# Patient Record
Sex: Female | Born: 1985 | Race: Black or African American | Hispanic: No | Marital: Single | Smoking: Former smoker
Health system: Southern US, Community
[De-identification: ages and names within clinical notes are randomized; demographics above are authoritative.]

## PROBLEM LIST (undated history)

## (undated) ENCOUNTER — Inpatient Hospital Stay (HOSPITAL_COMMUNITY): Payer: Self-pay

## (undated) DIAGNOSIS — R42 Dizziness and giddiness: Secondary | ICD-10-CM

## (undated) DIAGNOSIS — O009 Unspecified ectopic pregnancy without intrauterine pregnancy: Secondary | ICD-10-CM

## (undated) DIAGNOSIS — A4902 Methicillin resistant Staphylococcus aureus infection, unspecified site: Secondary | ICD-10-CM

## (undated) DIAGNOSIS — C510 Malignant neoplasm of labium majus: Secondary | ICD-10-CM

## (undated) DIAGNOSIS — B999 Unspecified infectious disease: Secondary | ICD-10-CM

## (undated) DIAGNOSIS — R87629 Unspecified abnormal cytological findings in specimens from vagina: Secondary | ICD-10-CM

## (undated) DIAGNOSIS — R51 Headache: Secondary | ICD-10-CM

## (undated) DIAGNOSIS — C801 Malignant (primary) neoplasm, unspecified: Secondary | ICD-10-CM

## (undated) DIAGNOSIS — G479 Sleep disorder, unspecified: Secondary | ICD-10-CM

## (undated) HISTORY — PX: LAPAROTOMY: SHX154

## (undated) HISTORY — DX: Unspecified abnormal cytological findings in specimens from vagina: R87.629

## (undated) HISTORY — PX: CRYOTHERAPY: SHX1416

## (undated) HISTORY — PX: DILATION AND CURETTAGE OF UTERUS: SHX78

## (undated) HISTORY — DX: Sleep disorder, unspecified: G47.9

---

## 2013-02-19 ENCOUNTER — Encounter (HOSPITAL_COMMUNITY): Payer: Self-pay | Admitting: *Deleted

## 2013-02-19 ENCOUNTER — Emergency Department (HOSPITAL_COMMUNITY)
Admission: EM | Admit: 2013-02-19 | Discharge: 2013-02-19 | Disposition: A | Discharge status: Home / Self Care | Payer: Medicaid Other | Attending: Emergency Medicine | Admitting: Emergency Medicine

## 2013-02-19 DIAGNOSIS — R197 Diarrhea, unspecified: Secondary | ICD-10-CM | POA: Insufficient documentation

## 2013-02-19 DIAGNOSIS — R509 Fever, unspecified: Secondary | ICD-10-CM | POA: Insufficient documentation

## 2013-02-19 DIAGNOSIS — B9689 Other specified bacterial agents as the cause of diseases classified elsewhere: Secondary | ICD-10-CM

## 2013-02-19 DIAGNOSIS — R110 Nausea: Secondary | ICD-10-CM

## 2013-02-19 DIAGNOSIS — F17200 Nicotine dependence, unspecified, uncomplicated: Secondary | ICD-10-CM | POA: Insufficient documentation

## 2013-02-19 DIAGNOSIS — N760 Acute vaginitis: Secondary | ICD-10-CM | POA: Insufficient documentation

## 2013-02-19 DIAGNOSIS — R112 Nausea with vomiting, unspecified: Secondary | ICD-10-CM | POA: Insufficient documentation

## 2013-02-19 DIAGNOSIS — R109 Unspecified abdominal pain: Secondary | ICD-10-CM | POA: Insufficient documentation

## 2013-02-19 DIAGNOSIS — Z8541 Personal history of malignant neoplasm of cervix uteri: Secondary | ICD-10-CM | POA: Insufficient documentation

## 2013-02-19 DIAGNOSIS — Z331 Pregnant state, incidental: Secondary | ICD-10-CM | POA: Insufficient documentation

## 2013-02-19 DIAGNOSIS — Z3202 Encounter for pregnancy test, result negative: Secondary | ICD-10-CM | POA: Insufficient documentation

## 2013-02-19 HISTORY — DX: Unspecified ectopic pregnancy without intrauterine pregnancy: O00.90

## 2013-02-19 HISTORY — DX: Malignant (primary) neoplasm, unspecified: C80.1

## 2013-02-19 LAB — BASIC METABOLIC PANEL
BUN: 9 mg/dL (ref 6–23)
Calcium: 9.2 mg/dL (ref 8.4–10.5)
GFR calc Af Amer: >90 mL/min (ref >90)
GFR calc non Af Amer: >90 mL/min (ref >90)
Potassium: 3.8 mEq/L (ref 3.5–5.1)
Sodium: 137 mEq/L (ref 135–145)

## 2013-02-19 LAB — URINALYSIS, ROUTINE W REFLEX MICROSCOPIC
Ketones, ur: NEGATIVE mg/dL
Leukocytes, UA: NEGATIVE
Nitrite: NEGATIVE
Protein, ur: NEGATIVE mg/dL

## 2013-02-19 LAB — CBC WITH DIFFERENTIAL/PLATELET
Basophils Relative: 0 % (ref 0–1)
Eosinophils Absolute: 0 10*3/uL (ref 0.0–0.7)
Eosinophils Relative: 1 % (ref 0–5)
MCH: 30.3 pg (ref 26.0–34.0)
MCHC: 33.5 g/dL (ref 30.0–36.0)
Monocytes Relative: 8 % (ref 3–12)
Neutrophils Relative %: 48 % (ref 43–77)
Platelets: 288 10*3/uL (ref 150–400)

## 2013-02-19 LAB — WET PREP, GENITAL: Trich, Wet Prep: NONE SEEN

## 2013-02-19 MED ORDER — GI COCKTAIL ~~LOC~~
30.0000 mL | Freq: Once | ORAL | Status: AC
  Administered 2013-02-19: 30 mL via ORAL
  Filled 2013-02-19: qty 30

## 2013-02-19 MED ORDER — METRONIDAZOLE 500 MG PO TABS: 500.0000 mg | ORAL_TABLET | Freq: Two times a day (BID) | ORAL | Status: DC

## 2013-02-19 MED ORDER — ONDANSETRON 8 MG PO TBDP
8.0000 mg | ORAL_TABLET | Freq: Once | ORAL | Status: AC
  Administered 2013-02-19: 8 mg via ORAL
  Filled 2013-02-19: qty 1

## 2013-02-19 MED ORDER — ONDANSETRON HCL 4 MG PO TABS: 4.0000 mg | ORAL_TABLET | Freq: Four times a day (QID) | ORAL | Status: DC

## 2013-02-19 NOTE — Progress Notes (Signed)
[facility] has seen patient. Pt stated that she just came to [facility] from [place] three weeks ago. Provided her with a list of primary care resources.

## 2013-02-19 NOTE — ED Provider Notes (Signed)
History    CSN: [id number] Arrival date & time 02/19/13  1350  First MD Initiated Contact with Patient 02/19/13 1357     No chief complaint on file.  (Consider location/radiation/quality/duration/timing/severity/associated sxs/prior Treatment) HPI  Patient is a 27 year old G3 P2 female presenting to the emergency department with 3 weeks of nausea, bloody nonbilious vomiting, non-bloody diarrhea. Patient states she's associated cramping bilateral lower abdominal pain w/o radiation. Rates her pain 3-4/10. Denies any aggravating or alleviating factors. She states she was hospitalized in [place] in May for his symptoms such as told to follow up with the GI doctor which she hasn't. Patient states that she believes she might be pregnant with her LMP 01/17/2013. Denies vaginal bleeding vaginal pain, vaginal discharge, pelvic pain.  Past Medical History  Diagnosis Date  . Cancer     cervical  . Extrauterine pregnancy    Past Surgical History  Procedure Laterality Date  . Cesarean section     History reviewed. No pertinent family history. History  Substance Use Topics  . Smoking status: Current Some Day Smoker  . Smokeless tobacco: Not on file  . Alcohol Use: Yes     Comment: occasionally   OB History   Grav Para Term Preterm Abortions TAB SAB Ect Mult Living                 Review of Systems  Constitutional: Positive for fever and chills.  Respiratory: Negative for shortness of breath.   Cardiovascular: Negative for chest pain.  Gastrointestinal: Positive for nausea, vomiting and diarrhea. Negative for abdominal distention.  Genitourinary: Negative for dysuria, urgency, vaginal bleeding, vaginal discharge and vaginal pain.  All other systems reviewed and are negative.    Allergies  Bactrim and Oxycodone  Home Medications   Current Outpatient Rx  Name  Route  Sig  Dispense  Refill  . acetaminophen (TYLENOL) 500 MG tablet   Oral   Take 1,000 mg by mouth every 6 (six)  hours as needed for pain.         . metroNIDAZOLE (FLAGYL) 500 MG tablet   Oral   Take 1 tablet (500 mg total) by mouth 2 (two) times daily.   14 tablet   0   . ondansetron (ZOFRAN) 4 MG tablet   Oral   Take 1 tablet (4 mg total) by mouth every 6 (six) hours.   12 tablet   0    BP 110/58  Pulse 74  Temp(Src) 97.9 F (36.6 C) (Oral)  Resp 16  Ht 5\' 2"  (1.575 m)  Wt 115 lb (52.164 kg)  BMI 21.03 kg/m2  SpO2 98%  LMP 01/17/2013 Physical Exam  Constitutional: She is oriented to person, place, and time. She appears well-developed and well-nourished. No distress.  HENT:  Head: Normocephalic and atraumatic.  Mouth/Throat: Oropharynx is clear and moist.  Eyes: Conjunctivae are normal.  Neck: Neck supple.  Cardiovascular: Normal rate, regular rhythm and normal heart sounds.   Pulmonary/Chest: Effort normal and breath sounds normal.  Abdominal: Soft. Bowel sounds are normal. She exhibits no distension. There is tenderness in the right lower quadrant, suprapubic area and left lower quadrant. There is no rigidity, no rebound and no guarding.  Neurological: She is alert and oriented to person, place, and time.  Skin: Skin is warm and dry. She is not diaphoretic.   Exam performed by Francee Piccolo L,  exam chaperoned Date: 02/19/2013 Pelvic exam: normal external genitalia without evidence of trauma. VULVA: normal appearing vulva with no  masses, tenderness or lesion. VAGINA: normal appearing vagina with normal color and discharge, no lesions. CERVIX: normal appearing cervix without lesions, cervical motion tenderness absent, cervical os closed with out purulent discharge; vaginal discharge - white, Wet prep and DNA probe for chlamydia and GC obtained.   ADNEXA: normal adnexa in size, nontender and no masses UTERUS: uterus is normal size, shape, consistency and nontender.    ED Course  Procedures (including critical care time) Labs Reviewed  WET PREP, GENITAL - Abnormal;  Notable for the following:    Clue Cells Wet Prep HPF POC FEW (*)    WBC, Wet Prep HPF POC RARE (*)    All other components within normal limits  URINALYSIS, ROUTINE W REFLEX MICROSCOPIC - Abnormal; Notable for the following:    APPearance CLOUDY (*)    All other components within normal limits  CBC WITH DIFFERENTIAL - Abnormal; Notable for the following:    Hemoglobin 11.9 (*)    HCT 35.5 (*)    All other components within normal limits  HCG, QUANTITATIVE, PREGNANCY - Abnormal; Notable for the following:    hCG, Beta Chain, Quant, S 2196 (*)    All other components within normal limits  POCT PREGNANCY, URINE - Abnormal; Notable for the following:    Preg Test, Ur POSITIVE (*)    All other components within normal limits  GC/CHLAMYDIA PROBE AMP  BASIC METABOLIC PANEL   No results found. 1. Nausea   2. Pregnancy as incidental finding   3. Diarrhea   4. Bacterial vaginosis     MDM  Patient with nausea, vomiting, diarrhea for one month. Physical examination reveals soft mildly tender nondistended abdomen with normal bowel sounds. Pelvic exam reveals weight discharge but no bleeding, adnexal fullness, CMT. Labs reviewed. Patient was informed of her pregnancy status. No imaging indicated at this time. No concern for tubal pregnancy at this time. Patient was advised to followup with the GI doctor in the area as she had been 2 months ago with the onset of the symptoms. Patient also advised to followup with OB/GYN at [facility] to discuss her pregnancy. Patient's symptoms were managed in the ED. Patient will be discharged symptomatic care. Discussed case with Dr. Patria Mane, who agrees with my plan. Patient is agreeable to plan. Patient is stable at time of discharge    Jeannetta Ellis, PA-C 02/19/13 1928

## 2013-02-19 NOTE — ED Notes (Signed)
Pt presents to ed with c/o vomiting and diarrhea x1 month; pt sts was hospitalized for the same month ago in [place], diagnosed with inflammation. Pt was referred to GI doctor which she didn't follow up with. Pt aslo reports fever unsure of how high yesterday. Pt also sts she might be pregnant; LMP 01/17/2013. Pt reports lower abdominal pain with cramps.

## 2013-02-20 LAB — GC/CHLAMYDIA PROBE AMP: GC Probe RNA: NEGATIVE

## 2013-02-20 NOTE — ED Provider Notes (Signed)
Medical screening examination/treatment/procedure(s) were performed by non-physician practitioner and as supervising physician I was immediately available for consultation/collaboration.  Return precautions discussed by PA with the pt  Lyanne Co, MD 02/20/13 [phone]

## 2013-02-27 ENCOUNTER — Encounter (HOSPITAL_COMMUNITY): Payer: Self-pay

## 2013-02-27 ENCOUNTER — Emergency Department (HOSPITAL_COMMUNITY)
Admission: EM | Admit: 2013-02-27 | Discharge: 2013-02-28 | Disposition: A | Discharge status: Home / Self Care | Payer: Medicaid Other | Attending: Emergency Medicine | Admitting: Emergency Medicine

## 2013-02-27 DIAGNOSIS — Z3490 Encounter for supervision of normal pregnancy, unspecified, unspecified trimester: Secondary | ICD-10-CM

## 2013-02-27 DIAGNOSIS — N390 Urinary tract infection, site not specified: Secondary | ICD-10-CM | POA: Insufficient documentation

## 2013-02-27 DIAGNOSIS — Z8541 Personal history of malignant neoplasm of cervix uteri: Secondary | ICD-10-CM | POA: Insufficient documentation

## 2013-02-27 DIAGNOSIS — O209 Hemorrhage in early pregnancy, unspecified: Secondary | ICD-10-CM | POA: Insufficient documentation

## 2013-02-27 DIAGNOSIS — O2390 Unspecified genitourinary tract infection in pregnancy, unspecified trimester: Secondary | ICD-10-CM | POA: Insufficient documentation

## 2013-02-27 DIAGNOSIS — O99330 Smoking (tobacco) complicating pregnancy, unspecified trimester: Secondary | ICD-10-CM | POA: Insufficient documentation

## 2013-02-27 LAB — POCT PREGNANCY, URINE: Preg Test, Ur: POSITIVE — AB

## 2013-02-27 LAB — URINALYSIS, ROUTINE W REFLEX MICROSCOPIC
Bilirubin Urine: NEGATIVE
Leukocytes, UA: NEGATIVE
Nitrite: POSITIVE — AB
Specific Gravity, Urine: 1.026 (ref 1.005–1.030)
Urobilinogen, UA: 1 mg/dL (ref 0.0–1.0)
pH: 7 (ref 5.0–8.0)

## 2013-02-27 LAB — URINE MICROSCOPIC-ADD ON

## 2013-02-27 NOTE — ED Notes (Signed)
Pt states that she is [redacted] weeks pregnant and has been bleeding, moderate amount no clots, since last Thursday, she states now its pinkish in color

## 2013-02-28 ENCOUNTER — Emergency Department (HOSPITAL_COMMUNITY): Payer: Medicaid Other

## 2013-02-28 LAB — CBC
MCHC: 34.8 g/dL (ref 30.0–36.0)
Platelets: 276 10*3/uL (ref 150–400)
RDW: 13.8 % (ref 11.5–15.5)
WBC: 8.4 10*3/uL (ref 4.0–10.5)

## 2013-02-28 LAB — ABO/RH: ABO/RH(D): O POS

## 2013-02-28 LAB — HCG, QUANTITATIVE, PREGNANCY: hCG, Beta Chain, Quant, S: 24613 m[IU]/mL — ABNORMAL HIGH (ref <5)

## 2013-02-28 MED ORDER — CEPHALEXIN 500 MG PO CAPS: 500.0000 mg | ORAL_CAPSULE | Freq: Four times a day (QID) | ORAL | Status: DC

## 2013-02-28 MED ORDER — CEPHALEXIN 500 MG PO CAPS
500.0000 mg | ORAL_CAPSULE | Freq: Once | ORAL | Status: AC
  Administered 2013-02-28: 500 mg via ORAL
  Filled 2013-02-28: qty 1

## 2013-02-28 NOTE — ED Notes (Signed)
Pt back from [place]

## 2013-02-28 NOTE — ED Provider Notes (Signed)
History    CSN: [id number] Arrival date & time 02/27/13  2202  First MD Initiated Contact with Patient 02/27/13 2327     Chief Complaint  Patient presents with  . Vaginal Bleeding   (Consider location/radiation/quality/duration/timing/severity/associated sxs/prior Treatment) HPI Patient presents emergency Department, vaginal bleeding, that started one week ago  The patient, states she seen in the emergency department1 week ago, and noticed bleeding, started after that time.  He says, states, that the bleeding has gotten less severe.  Patient denies chest pain, shortness breath, fever, headache, blurred vision, weakness, numbness, and syncope, or dysuria.  Patient, states she did not take any medications prior to arrival.patient has a history of ectopic pregnancy  Past Medical History  Diagnosis Date  . Cancer     cervical  . Extrauterine pregnancy    Past Surgical History  Procedure Laterality Date  . Cesarean section     History reviewed. No pertinent family history. History  Substance Use Topics  . Smoking status: Current Some Day Smoker  . Smokeless tobacco: Not on file  . Alcohol Use: Yes     Comment: occasionally   OB History   Grav Para Term Preterm Abortions TAB SAB Ect Mult Living                 Review of Systems All other systems negative except as documented in the HPI. All pertinent positives and negatives as reviewed in the HPI. Allergies  Bactrim and Oxycodone  Home Medications  No current outpatient prescriptions on file. BP 111/70  Pulse 84  Temp(Src) 98.4 F (36.9 C) (Oral)  Resp 20  Ht 5\' 2"  (1.575 m)  Wt 115 lb (52.164 kg)  BMI 21.03 kg/m2  SpO2 98%  LMP 01/17/2013 Physical Exam  Constitutional: She is oriented to person, place, and time. She appears well-developed and well-nourished. No distress.  HENT:  Head: Normocephalic and atraumatic.  Mouth/Throat: Oropharynx is clear and moist.  Eyes: Pupils are equal, round, and reactive to  light.  Neck: Normal range of motion. Neck supple.  Cardiovascular: Normal rate, regular rhythm and normal heart sounds.  Exam reveals no gallop and no friction rub.   No murmur heard. Pulmonary/Chest: Effort normal and breath sounds normal.  Abdominal: Soft. Bowel sounds are normal. She exhibits no distension.  Genitourinary:  Patient had a recent pelvic exam and does not wish to have another  Neurological: She is alert and oriented to person, place, and time.  Skin: Skin is warm and dry. No rash noted. No erythema.    ED Course  Procedures (including critical care time) Labs Reviewed  URINALYSIS, ROUTINE W REFLEX MICROSCOPIC - Abnormal; Notable for the following:    APPearance CLOUDY (*)    Nitrite POSITIVE (*)    All other components within normal limits  HCG, QUANTITATIVE, PREGNANCY - Abnormal; Notable for the following:    hCG, Beta Chain, Quant, [place] [address] (*)    All other components within normal limits  CBC - Abnormal; Notable for the following:    HCT 34.5 (*)    All other components within normal limits  URINE MICROSCOPIC-ADD ON - Abnormal; Notable for the following:    Squamous Epithelial / LPF FEW (*)    Bacteria, UA MANY (*)    All other components within normal limits  POCT PREGNANCY, URINE - Abnormal; Notable for the following:    Preg Test, Ur POSITIVE (*)    All other components within normal limits  ABO/RH  TYPE AND  SCREEN   Awaiting the results of her [place]. Dr.Campos to follow up on the results.  MDM    Carlyle Dolly, PA-C 03/02/13 [phone]

## 2013-02-28 NOTE — ED Provider Notes (Signed)
1:56 AM Patient feels good at this time.  Abdominal exam is benign.  First trimester ultrasound demonstrates normal intrauterine pregnancy without complications.  Vaginal bleeding seems to be improving.  [facility] for new or worsening symptoms.  She will need to call an obstetrician for followup.  She was given the number for women's clinic.  She was told to not drink alcohol, smoke cigarettes, use drugs.  She was told to take prenatal vitamin every day.  She does have nitrite positive urine.  She will be given a dose of Keflex here in the emergency department and discharged home with 7 days of Keflex.  First trimester bleeding precautions given  Lyanne Co, MD 02/28/13 0157

## 2013-03-04 NOTE — ED Provider Notes (Signed)
Medical screening examination/treatment/procedure(s) were conducted as a shared visit with non-physician practitioner(s) and myself.  I personally evaluated the patient during the encounter  Please see my other note for complete details  Lyanne Co, MD 03/04/13 [phone]

## 2013-03-08 ENCOUNTER — Inpatient Hospital Stay (HOSPITAL_COMMUNITY)
Admission: EM | Admit: 2013-03-08 | Discharge: 2013-03-11 | DRG: 781 | Disposition: A | Discharge status: Home / Self Care | Payer: Medicaid Other | Attending: Internal Medicine | Admitting: Internal Medicine

## 2013-03-08 ENCOUNTER — Encounter (HOSPITAL_COMMUNITY): Payer: Self-pay | Admitting: Emergency Medicine

## 2013-03-08 DIAGNOSIS — Z3490 Encounter for supervision of normal pregnancy, unspecified, unspecified trimester: Secondary | ICD-10-CM

## 2013-03-08 DIAGNOSIS — O219 Vomiting of pregnancy, unspecified: Secondary | ICD-10-CM | POA: Diagnosis present

## 2013-03-08 DIAGNOSIS — E860 Dehydration: Secondary | ICD-10-CM | POA: Diagnosis present

## 2013-03-08 DIAGNOSIS — Z885 Allergy status to narcotic agent status: Secondary | ICD-10-CM

## 2013-03-08 DIAGNOSIS — Z8541 Personal history of malignant neoplasm of cervix uteri: Secondary | ICD-10-CM

## 2013-03-08 DIAGNOSIS — O210 Mild hyperemesis gravidarum: Secondary | ICD-10-CM | POA: Diagnosis present

## 2013-03-08 DIAGNOSIS — O99330 Smoking (tobacco) complicating pregnancy, unspecified trimester: Secondary | ICD-10-CM | POA: Diagnosis present

## 2013-03-08 DIAGNOSIS — Z881 Allergy status to other antibiotic agents status: Secondary | ICD-10-CM

## 2013-03-08 DIAGNOSIS — E876 Hypokalemia: Secondary | ICD-10-CM | POA: Diagnosis present

## 2013-03-08 LAB — URINALYSIS, ROUTINE W REFLEX MICROSCOPIC
Protein, ur: NEGATIVE mg/dL
Urobilinogen, UA: 1 mg/dL (ref 0.0–1.0)

## 2013-03-08 LAB — COMPREHENSIVE METABOLIC PANEL
BUN: 9 mg/dL (ref 6–23)
CO2: 21 mEq/L (ref 19–32)
Chloride: 98 mEq/L (ref 96–112)
Creatinine, Ser: 0.55 mg/dL (ref 0.50–1.10)
GFR calc Af Amer: >90 mL/min (ref >90)
GFR calc non Af Amer: >90 mL/min (ref >90)
Total Bilirubin: 1.2 mg/dL (ref 0.3–1.2)

## 2013-03-08 LAB — CBC WITH DIFFERENTIAL/PLATELET
HCT: 36.6 % (ref 36.0–46.0)
Hemoglobin: 12.7 g/dL (ref 12.0–15.0)
Lymphocytes Relative: 29 % (ref 12–46)
MCHC: 34.7 g/dL (ref 30.0–36.0)
MCV: 88.8 fL (ref 78.0–100.0)
Monocytes Absolute: 0.8 10*3/uL (ref 0.1–1.0)
Monocytes Relative: 10 % (ref 3–12)
Neutro Abs: 4.6 10*3/uL (ref 1.7–7.7)
WBC: 7.7 10*3/uL (ref 4.0–10.5)

## 2013-03-08 LAB — URINE MICROSCOPIC-ADD ON

## 2013-03-08 MED ORDER — DEXTROSE 5 % AND 0.45 % NACL IV BOLUS
1000.0000 mL | Freq: Once | INTRAVENOUS | Status: AC
Start: 2013-03-08 — End: 2013-03-09
  Administered 2013-03-08: 1000 mL via INTRAVENOUS

## 2013-03-08 MED ORDER — ONDANSETRON 4 MG PO TBDP
4.0000 mg | ORAL_TABLET | Freq: Once | ORAL | Status: AC
  Administered 2013-03-08: 4 mg via ORAL
  Filled 2013-03-08: qty 1

## 2013-03-08 MED ORDER — ONDANSETRON HCL 4 MG/2ML IJ SOLN
4.0000 mg | Freq: Once | INTRAMUSCULAR | Status: AC
  Administered 2013-03-08: 4 mg via INTRAVENOUS
  Filled 2013-03-08: qty 2

## 2013-03-08 MED ORDER — SODIUM CHLORIDE 0.9 % IV BOLUS (SEPSIS): 1000.0000 mL | Freq: Once | INTRAVENOUS | Status: DC

## 2013-03-08 NOTE — ED Notes (Signed)
Was seen on 7/16  For n/v due to preg and she states she states she is no better has diarrhea now also she staes

## 2013-03-08 NOTE — ED Notes (Signed)
Pt updated on wait time and care

## 2013-03-08 NOTE — ED Provider Notes (Signed)
CSN: [id number]     Arrival date & time 03/08/13  1412 History     First MD Initiated Contact with Patient 03/08/13 2027     Chief Complaint  Patient presents with  . Emesis  . Emesis During Pregnancy   (Consider location/radiation/quality/duration/timing/severity/associated sxs/prior Treatment) HPI Crystal Jenkins 27 y.o. who is 7 weeks 6 days pregnant by ultrasound and most recent visit. She presents for intractable nausea, vomiting. These symptoms have been worsening over the past 2-3 days. Patient reports quantifying the number of times of vomiting as "more than I can count". Vomitus is nonbilious nonbloody. She reports generalized abdominal pain after vomiting. She also notes generalized back pain after vomiting. She believes as though if her vomiting would stop her other symptoms would improve as well. Her vomiting has not responded to by mouth Zofran home. She denies lower abdominal pain, vaginal discharge, vaginal bleeding. She also denies lightheadedness, dizziness, headache. Symptoms are noted identical to nausea and vomiting associated with 2 previous pregnancies.  Past Medical History  Diagnosis Date  . Cancer     cervical  . Extrauterine pregnancy    Past Surgical History  Procedure Laterality Date  . Cesarean section     No family history on file. History  Substance Use Topics  . Smoking status: Current Some Day Smoker  . Smokeless tobacco: Not on file  . Alcohol Use: Yes     Comment: occasionally   OB History   Grav Para Term Preterm Abortions TAB SAB Ect Mult Living                 Review of Systems  Constitutional: Negative for fever, chills, diaphoresis, activity change and appetite change.  HENT: Negative for sore throat, rhinorrhea, sneezing, drooling and trouble swallowing.   Eyes: Negative for discharge and redness.  Respiratory: Negative for cough, chest tightness, shortness of breath, wheezing and stridor.   Cardiovascular: Negative for chest pain and  leg swelling.  Gastrointestinal: Positive for nausea, vomiting and abdominal pain. Negative for diarrhea, constipation and blood in stool.  Genitourinary: Negative for difficulty urinating.  Musculoskeletal: Positive for back pain. Negative for myalgias and arthralgias.  Skin: Negative for pallor.  Neurological: Negative for dizziness, syncope, speech difficulty, weakness, light-headedness and headaches.  Hematological: Negative for adenopathy. Does not bruise/bleed easily.  Psychiatric/Behavioral: Negative for confusion and agitation.    Allergies  Bactrim and Oxycodone  Home Medications   Current Outpatient Rx  Name  Route  Sig  Dispense  Refill  . cephALEXin (KEFLEX) 500 MG capsule   Oral   Take 1 capsule (500 mg total) by mouth 4 (four) times daily.   28 capsule   0   . ondansetron (ZOFRAN) 4 MG tablet   Oral   Take 4 mg by mouth every 8 (eight) hours as needed for nausea.          BP 110/74  Pulse 75  Temp(Src) 99.2 F (37.3 C) (Oral)  Resp 16  Ht 5\' 2"  (1.575 m)  Wt 115 lb (52.164 kg)  BMI 21.03 kg/m2  SpO2 100%  LMP 01/17/2013 Physical Exam  Constitutional: She is oriented to person, place, and time. She appears well-developed and well-nourished. No distress.  HENT:  Head: Normocephalic and atraumatic.  Right Ear: External ear normal.  Left Ear: External ear normal.  Eyes: Conjunctivae and EOM are normal. Right eye exhibits no discharge. Left eye exhibits no discharge.  Neck: Normal range of motion. Neck supple. No JVD present.  Cardiovascular:  Normal rate, regular rhythm and normal heart sounds.  Exam reveals no gallop and no friction rub.   No murmur heard. Pulmonary/Chest: Effort normal and breath sounds normal. No stridor. No respiratory distress. She has no wheezes. She has no rales. She exhibits no tenderness.  Abdominal: Soft. Bowel sounds are normal. She exhibits no shifting dullness and no distension. There is no hepatosplenomegaly. There is no  tenderness. There is no rigidity, no rebound, no guarding, no CVA tenderness, no tenderness at McBurney's point and negative Murphy's sign.  Musculoskeletal: Normal range of motion. She exhibits no edema.  Neurological: She is alert and oriented to person, place, and time.  Skin: Skin is warm. No rash noted. She is not diaphoretic.  Psychiatric: She has a normal mood and affect. Her behavior is normal.    ED Course   Procedures (including critical care time)  Labs Reviewed  COMPREHENSIVE METABOLIC PANEL - Abnormal; Notable for the following:    Sodium 131 (*)    Glucose, Bld 112 (*)    All other components within normal limits  URINALYSIS, ROUTINE W REFLEX MICROSCOPIC - Abnormal; Notable for the following:    Color, Urine AMBER (*)    APPearance CLOUDY (*)    Ketones, ur >80 (*)    Leukocytes, UA SMALL (*)    All other components within normal limits  URINE MICROSCOPIC-ADD ON - Abnormal; Notable for the following:    Squamous Epithelial / LPF MANY (*)    All other components within normal limits  URINE CULTURE  CBC WITH DIFFERENTIAL   Results for orders placed during the hospital encounter of 03/08/13  CBC WITH DIFFERENTIAL      Result Value Range   WBC 7.7  4.0 - 10.5 K/uL   RBC 4.12  3.87 - 5.11 MIL/uL   Hemoglobin 12.7  12.0 - 15.0 g/dL   HCT 40.9  81.1 - 91.4 %   MCV 88.8  78.0 - 100.0 fL   MCH 30.8  26.0 - 34.0 pg   MCHC 34.7  30.0 - 36.0 g/dL   RDW 78.2  95.6 - 21.3 %   Platelets 280  150 - 400 K/uL   Neutrophils Relative % 60  43 - 77 %   Neutro Abs 4.6  1.7 - 7.7 K/uL   Lymphocytes Relative 29  12 - 46 %   Lymphs Abs 2.3  0.7 - 4.0 K/uL   Monocytes Relative 10  3 - 12 %   Monocytes Absolute 0.8  0.1 - 1.0 K/uL   Eosinophils Relative 0  0 - 5 %   Eosinophils Absolute 0.0  0.0 - 0.7 K/uL   Basophils Relative 0  0 - 1 %   Basophils Absolute 0.0  0.0 - 0.1 K/uL  COMPREHENSIVE METABOLIC PANEL      Result Value Range   Sodium 131 (*) 135 - 145 mEq/L   Potassium  3.5  3.5 - 5.1 mEq/L   Chloride 98  96 - 112 mEq/L   CO2 21  19 - 32 mEq/L   Glucose, Bld 112 (*) 70 - 99 mg/dL   BUN 9  6 - 23 mg/dL   Creatinine, Ser 0.86  0.50 - 1.10 mg/dL   Calcium 57.8  8.4 - 46.9 mg/dL   Total Protein 7.7  6.0 - 8.3 g/dL   Albumin 4.1  3.5 - 5.2 g/dL   AST 15  0 - 37 U/L   ALT 10  0 - 35 U/L   Alkaline  Phosphatase 57  39 - 117 U/L   Total Bilirubin 1.2  0.3 - 1.2 mg/dL   GFR calc non Af Amer >90  >90 mL/min   GFR calc Af Amer >90  >90 mL/min  URINALYSIS, ROUTINE W REFLEX MICROSCOPIC      Result Value Range   Color, Urine AMBER (*) YELLOW   APPearance CLOUDY (*) CLEAR   Specific Gravity, Urine 1.030  1.005 - 1.030   pH 6.0  5.0 - 8.0   Glucose, UA NEGATIVE  NEGATIVE mg/dL   Hgb urine dipstick NEGATIVE  NEGATIVE   Bilirubin Urine NEGATIVE  NEGATIVE   Ketones, ur >80 (*) NEGATIVE mg/dL   Protein, ur NEGATIVE  NEGATIVE mg/dL   Urobilinogen, UA 1.0  0.0 - 1.0 mg/dL   Nitrite NEGATIVE  NEGATIVE   Leukocytes, UA SMALL (*) NEGATIVE  URINE MICROSCOPIC-ADD ON      Result Value Range   Squamous Epithelial / LPF MANY (*) RARE   WBC, UA 3-6  <3 WBC/hpf   Bacteria, UA RARE  RARE    No results found. No diagnosis found.  MDM  Karrie Meres 27 y.o. who is in the first trimester of pregnancy presents for nausea and vomiting. Symptoms similar to previous pregnancies. She is afebrile vital signs are stable. Actively vomiting on exam. She has had no relief with by mouth Zofran home. Abdomen is nondistended nontender. No signs or symptoms of peritonitis. UA suggestive of dehydration. Patient did not tolerate ODT Zofran with a by mouth challenge. Patient was started on IV fluids given IV antibiemetics. Will reevaluate.  Following IV Zofran patient continued to have nausea and vomiting. She has vomited at least 10 times in the emergency department. Will start another liter of IV fluids and give her Zofran, Reglan and Benadryl for intractable nausea and vomiting.   UA  significant for ketones and elevated specific gravity, which is consistent with dehydration.   Admission indicated for intractable nausea vomiting and dehydration.  Labs reviewed. I discussed this patient's care with my attending, Dr. Rhunette Croft.   Sena Hitch, MD 03/09/13 [phone]  I performed a history and physical examination of  Ravleen Ries and discussed her management with Dr. Malen Gauze. I agree with the history, physical, assessment, and plan of care, with the following exceptions: None I was present for the following procedures: None  Time Spent in Critical Care of the patient: None  Time spent in discussions with the patient and family: 10 minutes  Jondavid Schreier   Derwood Kaplan, MD 03/11/13 0002

## 2013-03-09 ENCOUNTER — Encounter (HOSPITAL_COMMUNITY): Payer: Self-pay | Admitting: *Deleted

## 2013-03-09 DIAGNOSIS — Z331 Pregnant state, incidental: Secondary | ICD-10-CM

## 2013-03-09 DIAGNOSIS — O219 Vomiting of pregnancy, unspecified: Secondary | ICD-10-CM

## 2013-03-09 DIAGNOSIS — Z3490 Encounter for supervision of normal pregnancy, unspecified, unspecified trimester: Secondary | ICD-10-CM

## 2013-03-09 DIAGNOSIS — E860 Dehydration: Secondary | ICD-10-CM | POA: Diagnosis present

## 2013-03-09 DIAGNOSIS — O210 Mild hyperemesis gravidarum: Secondary | ICD-10-CM | POA: Diagnosis present

## 2013-03-09 MED ORDER — ONDANSETRON HCL 4 MG PO TABS: 4.0000 mg | ORAL_TABLET | Freq: Four times a day (QID) | ORAL | Status: DC | PRN

## 2013-03-09 MED ORDER — ONDANSETRON HCL 4 MG/2ML IJ SOLN
4.0000 mg | Freq: Four times a day (QID) | INTRAMUSCULAR | Status: DC
  Administered 2013-03-09 (×2): 4 mg via INTRAVENOUS
  Filled 2013-03-09: qty 6

## 2013-03-09 MED ORDER — ONDANSETRON HCL 4 MG/2ML IJ SOLN
4.0000 mg | INTRAMUSCULAR | Status: DC
  Administered 2013-03-10 – 2013-03-11 (×9): 4 mg via INTRAVENOUS
  Filled 2013-03-09 (×8): qty 2

## 2013-03-09 MED ORDER — DIPHENHYDRAMINE HCL 50 MG/ML IJ SOLN
25.0000 mg | Freq: Once | INTRAMUSCULAR | Status: AC
  Administered 2013-03-09: 25 mg via INTRAVENOUS
  Filled 2013-03-09: qty 1

## 2013-03-09 MED ORDER — PROMETHAZINE HCL 25 MG/ML IJ SOLN
12.5000 mg | Freq: Four times a day (QID) | INTRAMUSCULAR | Status: DC | PRN
  Administered 2013-03-09: 12.5 mg via INTRAVENOUS
  Filled 2013-03-09: qty 1

## 2013-03-09 MED ORDER — DEXTROSE 5 % AND 0.45 % NACL IV BOLUS
1000.0000 mL | Freq: Once | INTRAVENOUS | Status: AC
  Administered 2013-03-09: 1000 mL via INTRAVENOUS

## 2013-03-09 MED ORDER — ONDANSETRON HCL 4 MG/2ML IJ SOLN
4.0000 mg | Freq: Once | INTRAMUSCULAR | Status: AC
  Administered 2013-03-09: 4 mg via INTRAVENOUS
  Filled 2013-03-09: qty 2

## 2013-03-09 MED ORDER — METOCLOPRAMIDE HCL 5 MG/ML IJ SOLN
10.0000 mg | Freq: Once | INTRAMUSCULAR | Status: AC
  Administered 2013-03-09: 10 mg via INTRAVENOUS
  Filled 2013-03-09: qty 2

## 2013-03-09 MED ORDER — ONDANSETRON HCL 4 MG/2ML IJ SOLN: 4.0000 mg | Freq: Four times a day (QID) | INTRAMUSCULAR | Status: DC | PRN

## 2013-03-09 MED ORDER — DEXTROSE-NACL 5-0.9 % IV SOLN
INTRAVENOUS | Status: DC
  Administered 2013-03-09 – 2013-03-10 (×4): via INTRAVENOUS

## 2013-03-09 NOTE — ED Notes (Signed)
Rates pain at a 9 and is still nauseated.

## 2013-03-09 NOTE — H&P (Signed)
[facility] History and Physical  Crystal Jenkins ZOX:[id number] DOB: 1986/05/17    PCP:  NONE  Chief Complaint: Intractable nausea and vomiting.  HPI: Crystal Jenkins is an 27 y.o. female in first trimester pregnancy (8 weeks) otherwise healthy presents to ER with intractable nausea and vomiting.  She also had similar symptoms with her other two pregancy.  She was evaluated in the ER with normal WBC, Hb, and renal fx tests.  Her K and LFTs were normal as well.  She was given several rounds of antiemetics and still not able to take PO, so hospitalist was asked to admit her for IV rehydration and symptomatic Tx of hyperemesis gravidum.  Rewiew of Systems:  Constitutional: Negative for malaise, fever and chills. No significant weight loss or weight gain Eyes: Negative for eye pain, redness and discharge, diplopia, visual changes, or flashes of light. ENMT: Negative for ear pain, hoarseness, nasal congestion, sinus pressure and sore throat. No headaches; tinnitus, drooling, or problem swallowing. Cardiovascular: Negative for chest pain, palpitations, diaphoresis, dyspnea and peripheral edema. ; No orthopnea, PND Respiratory: Negative for cough, hemoptysis, wheezing and stridor. No pleuritic chestpain. Gastrointestinal: Negative for diarrhea, constipation, abdominal pain, melena, blood in stool, hematemesis, jaundice and rectal bleeding.    Genitourinary: Negative for frequency, dysuria, incontinence,flank pain and hematuria; Musculoskeletal: Negative for back pain and neck pain. Negative for swelling and trauma.;  Skin: . Negative for pruritus, rash, abrasions, bruising and skin lesion.; ulcerations Neuro: Negative for headache, lightheadedness and neck stiffness. Negative for weakness, altered level of consciousness , altered mental status, extremity weakness, burning feet, involuntary movement, seizure and syncope.  Psych: negative for anxiety, depression, insomnia, tearfulness, panic  attacks, hallucinations, paranoia, suicidal or homicidal ideation    Past Medical History  Diagnosis Date  . Cancer     cervical  . Extrauterine pregnancy     Past Surgical History  Procedure Laterality Date  . Cesarean section      Medications:  HOME MEDS: Prior to Admission medications   Medication Sig Start Date End Date Taking? Authorizing Provider  cephALEXin (KEFLEX) 500 MG capsule Take 1 capsule (500 mg total) by mouth 4 (four) times daily. 02/28/13  Yes Lyanne Co, MD  ondansetron (ZOFRAN) 4 MG tablet Take 4 mg by mouth every 8 (eight) hours as needed for nausea.   Yes Historical Provider, MD     Allergies:  Allergies  Allergen Reactions  . Bactrim (Sulfamethoxazole W-Trimethoprim)     Constipation   . Oxycodone Itching    Social History:   reports that she has been smoking.  She does not have any smokeless tobacco history on file. She reports that  drinks alcohol. She reports that she does not use illicit drugs.  Family History: History reviewed. No pertinent family history.   Physical Exam: Filed Vitals:   03/09/13 0200 03/09/13 0308 03/09/13 0317 03/09/13 0514  BP: 110/76 103/59  100/56  Pulse: 77 68  60  Temp:  98.1 F (36.7 C)  97.6 F (36.4 C)  TempSrc:  Oral  Oral  Resp:  16  16  Height:   5\' 2"  (1.575 m)   Weight:   54.2 kg (119 lb 7.8 oz)   SpO2: 99% 100%  100%   Blood pressure 100/56, pulse 60, temperature 97.6 F (36.4 C), temperature source Oral, resp. rate 16, height 5\' 2"  (1.575 m), weight 54.2 kg (119 lb 7.8 oz), last menstrual period 01/17/2013, SpO2 100.00%.  GEN:  Pleasant patient lying in the stretcher  in no acute distress; cooperative with exam. PSYCH:  alert and oriented x4; does not appear anxious or depressed; affect is appropriate. HEENT: Mucous membranes pink and anicteric; PERRLA; EOM intact; no cervical lymphadenopathy nor thyromegaly or carotid bruit; no JVD; There were no stridor. Neck is very supple. Breasts:: Not  examined CHEST WALL: No tenderness CHEST: Normal respiration, clear to auscultation bilaterally.  HEART: Regular rate and rhythm.  There are no murmur, rub, or gallops.   BACK: No kyphosis or scoliosis; no CVA tenderness ABDOMEN: soft and non-tender; no masses, no organomegaly, normal abdominal bowel sounds; no pannus; no intertriginous candida. There is no rebound and no distention. Rectal Exam: Not done EXTREMITIES: No bone or joint deformity; age-appropriate arthropathy of the hands and knees; no edema; no ulcerations.  There is no calf tenderness. Genitalia: not examined PULSES: 2+ and symmetric SKIN: Normal hydration no rash or ulceration CNS: Cranial nerves 2-12 grossly intact no focal lateralizing neurologic deficit.  Speech is fluent; uvula elevated with phonation, facial symmetry and tongue midline. DTR are normal bilaterally, cerebella exam is intact, barbinski is negative and strengths are equaled bilaterally.  No sensory loss.   Labs on Admission:  Basic Metabolic Panel:  Recent Labs Lab 03/08/13 1509  NA 131*  K 3.5  CL 98  CO2 21  GLUCOSE 112*  BUN 9  CREATININE 0.55  CALCIUM 10.1   Liver Function Tests:  Recent Labs Lab 03/08/13 1509  AST 15  ALT 10  ALKPHOS 57  BILITOT 1.2  PROT 7.7  ALBUMIN 4.1   No results found for this basename: LIPASE, AMYLASE,  in the last 168 hours No results found for this basename: AMMONIA,  in the last 168 hours CBC:  Recent Labs Lab 03/08/13 1509  WBC 7.7  NEUTROABS 4.6  HGB 12.7  HCT 36.6  MCV 88.8  PLT 280   Cardiac Enzymes: No results found for this basename: CKTOTAL, CKMB, CKMBINDEX, TROPONINI,  in the last 168 hours  CBG: No results found for this basename: GLUCAP,  in the last 168 hours   Radiological Exams on Admission: No results found.  Assessment/Plan Present on Admission:  . Nausea and vomiting in pregnancy . Hyperemesis gravidarum . Dehydration, mild   PLAN:  WIll admit her for hyperemesis  gravidarum and tx her symptoms along with IV rehydration.  Will give Zofran and IVF.  She is stable, full code, and will be admitted to [facility] service.  Thank you for allowing me to partake in the care of this nice patient  Other plans as per orders.  Code Status: FULL Unk Lightning, MD. [facility] Pager [phone] 7pm to 7am.  03/09/2013, 6:27 AM

## 2013-03-09 NOTE — Progress Notes (Signed)
Patient seen and examined by me.  Admitted by Dr. Conley Rolls- please see H&P.  IVF, ATC IV zofran- check labs in AM  [facility] DO

## 2013-03-09 NOTE — ED Notes (Signed)
Report given to floor nurse and pt transported to the floor with tech. Pt alert and in NAd at time of admission  

## 2013-03-09 NOTE — ED Notes (Signed)
MD at bedside. 

## 2013-03-10 DIAGNOSIS — O210 Mild hyperemesis gravidarum: Secondary | ICD-10-CM

## 2013-03-10 LAB — BASIC METABOLIC PANEL
GFR calc non Af Amer: >90 mL/min (ref >90)
Glucose, Bld: 99 mg/dL (ref 70–99)
Potassium: 3.2 mEq/L — ABNORMAL LOW (ref 3.5–5.1)
Sodium: 136 mEq/L (ref 135–145)

## 2013-03-10 LAB — CBC
Hemoglobin: 10.6 g/dL — ABNORMAL LOW (ref 12.0–15.0)
MCH: 31.6 pg (ref 26.0–34.0)
MCHC: 33.5 g/dL (ref 30.0–36.0)
MCHC: 35.6 g/dL (ref 30.0–36.0)
MCV: 89 fL (ref 78.0–100.0)
Platelets: 241 10*3/uL (ref 150–400)
RBC: 3.51 MIL/uL — ABNORMAL LOW (ref 3.87–5.11)
RBC: 3.54 MIL/uL — ABNORMAL LOW (ref 3.87–5.11)

## 2013-03-10 LAB — URINE CULTURE

## 2013-03-10 MED ORDER — METOCLOPRAMIDE HCL 5 MG/ML IJ SOLN
10.0000 mg | Freq: Four times a day (QID) | INTRAMUSCULAR | Status: DC
  Administered 2013-03-10 – 2013-03-11 (×4): 10 mg via INTRAVENOUS
  Filled 2013-03-10 (×8): qty 2

## 2013-03-10 MED ORDER — KCL IN DEXTROSE-NACL 40-5-0.9 MEQ/L-%-% IV SOLN
INTRAVENOUS | Status: DC
  Administered 2013-03-10: 11:00:00 via INTRAVENOUS
  Filled 2013-03-10 (×3): qty 1000

## 2013-03-10 NOTE — Progress Notes (Signed)
[facility] PROGRESS NOTE  Crystal Jenkins ZOX:[id number] DOB: 01/26/1986 DOA: 03/08/2013 PCP: No PCP Per Patient  Assessment/Plan: 1. Hyperemesis gravidarum- zofran ATC and add reglan; try to avoid phenergan as class C, patient not sure if she is keeping the pregnancy- asked care management to find an OB 2. Hypokalemia- replete  Code Status: full Family Communication: patient Disposition Plan:    Consultants:    Procedures:    Antibiotics:    HPI/Subjective: Still having some nausea  Objective: Filed Vitals:   03/09/13 0514 03/09/13 1300 03/09/13 2105 03/10/13 0507  BP: 100/56 83/49 106/77 93/53  Pulse: 60 59 68 81  Temp: 97.6 F (36.4 C) 98.2 F (36.8 C) 98.3 F (36.8 C) 98.6 F (37 C)  TempSrc: Oral  Oral Oral  Resp: 16 16 16 16   Height:      Weight:      SpO2: 100% 97% 100% 100%    Intake/Output Summary (Last 24 hours) at 03/10/13 1406 Last data filed at 03/10/13 0600  Gross per 24 hour  Intake 2425.67 ml  Output    400 ml  Net 2025.67 ml   Filed Weights   03/08/13 1424 03/09/13 0317  Weight: 52.164 kg (115 lb) 54.2 kg (119 lb 7.8 oz)    Exam:   General:  uncomfortable appearing  Cardiovascular: rrr  Respiratory: clear anterior  Abdomen: +BS, soft  Musculoskeletal: moves all 4 ext   Data Reviewed: Basic Metabolic Panel:  Recent Labs Lab 03/08/13 1509 03/10/13 0520  NA 131* 136  K 3.5 3.2*  CL 98 108  CO2 21 23  GLUCOSE 112* 99  BUN 9 3*  CREATININE 0.55 0.64  CALCIUM 10.1 8.7   Liver Function Tests:  Recent Labs Lab 03/08/13 1509  AST 15  ALT 10  ALKPHOS 57  BILITOT 1.2  PROT 7.7  ALBUMIN 4.1   No results found for this basename: LIPASE, AMYLASE,  in the last 168 hours No results found for this basename: AMMONIA,  in the last 168 hours CBC:  Recent Labs Lab 03/08/13 1509 03/10/13 0520  WBC 7.7 6.2  NEUTROABS 4.6  --   HGB 12.7 10.6*  HCT 36.6 31.6*  MCV 88.8 90.0  PLT 280 234   Cardiac  Enzymes: No results found for this basename: CKTOTAL, CKMB, CKMBINDEX, TROPONINI,  in the last 168 hours BNP (last 3 results) No results found for this basename: PROBNP,  in the last 8760 hours CBG: No results found for this basename: GLUCAP,  in the last 168 hours  No results found for this or any previous visit (from the past 240 hour(s)).   Studies: No results found.  Scheduled Meds: . metoCLOPramide (REGLAN) injection  10 mg Intravenous Q6H  . ondansetron (ZOFRAN) IV  4 mg Intravenous Q4H   Continuous Infusions: . dextrose 5 % and 0.9 % NaCl with KCl 40 mEq/L 75 mL/hr at 03/10/13 1050    Principal Problem:   Hyperemesis gravidarum Active Problems:   Nausea and vomiting in pregnancy   Dehydration, mild   Pregnancy    Time spent: 35    [facility], Dempsey Ahonen  [facility] Pager [phone]. If 7PM-7AM, please contact night-coverage at [email or web address], password [facility] 03/10/2013, 2:06 PM  LOS: 2 days

## 2013-03-11 LAB — BASIC METABOLIC PANEL
CO2: 20 mEq/L (ref 19–32)
Calcium: 9.1 mg/dL (ref 8.4–10.5)
Creatinine, Ser: 0.57 mg/dL (ref 0.50–1.10)
GFR calc non Af Amer: >90 mL/min (ref >90)
Glucose, Bld: 98 mg/dL (ref 70–99)

## 2013-03-11 MED ORDER — METOCLOPRAMIDE HCL 5 MG PO TABS: 5.0000 mg | ORAL_TABLET | Freq: Four times a day (QID) | ORAL | Status: DC

## 2013-03-11 MED ORDER — PROMETHAZINE HCL 12.5 MG RE SUPP: 12.5000 mg | Freq: Four times a day (QID) | RECTAL | Status: DC | PRN

## 2013-03-11 MED ORDER — ONDANSETRON HCL 4 MG PO TABS: 4.0000 mg | ORAL_TABLET | Freq: Three times a day (TID) | ORAL | Status: DC

## 2013-03-11 NOTE — Progress Notes (Signed)
03-11-13 List of OB / GYN MD's and Primary Care Resoures given to patient . Ronny Flurry RN BSN [phone]

## 2013-03-11 NOTE — Discharge Summary (Signed)
Physician Discharge Summary  Crystal Jenkins NFA:[id number] DOB: 1986-06-22 DOA: 03/08/2013  PCP: No PCP Per Patient  Admit date: 03/08/2013 Discharge date: 03/12/2013  Time spent: 35 minutes  Recommendations for Outpatient Follow-up:  Follow up with OB GYN   Discharge Diagnoses:  Principal Problem:   Hyperemesis gravidarum Active Problems:   Nausea and vomiting in pregnancy   Dehydration, mild   Pregnancy   Discharge Condition: improved-up walking around  Diet recommendation: small meals  Filed Weights   03/08/13 1424 03/09/13 0317  Weight: 52.164 kg (115 lb) 54.2 kg (119 lb 7.8 oz)    History of present illness:  Crystal Jenkins is an 27 y.o. female in first trimester pregnancy (8 weeks) otherwise healthy presents to ER with intractable nausea and vomiting. She also had similar symptoms with her other two pregancy. She was evaluated in the ER with normal WBC, Hb, and renal fx tests. Her K and LFTs were normal as well. She was given several rounds of antiemetics and still not able to take PO, so hospitalist was asked to admit her for IV rehydration and symptomatic Tx of hyperemesis gravidum   Hospital Course:  Hyperemesis gravidarum- zofran ATC and add reglan; try to avoid phenergan as class C, patient not sure if she is keeping the pregnancy- asked care management to find an OB   Hypokalemia- replete   Procedures:  none  Consultations:  none  Discharge Exam: Filed Vitals:   03/10/13 0507 03/10/13 1433 03/10/13 2105 03/11/13 0500  BP: 93/53 111/65 108/62 100/61  Pulse: 81 79 76 80  Temp: 98.6 F (37 C) 98.7 F (37.1 C) 98.6 F (37 C) 99 F (37.2 C)  TempSrc: Oral Oral Oral Oral  Resp: 16 18 18 18   Height:      Weight:      SpO2: 100% 100% 100% 100%    General: A+Ox3, NAd Cardiovascular: rrr Respiratory: clear anterior  Discharge Instructions      Discharge Orders   Future Orders Complete By Expires     Discharge instructions  As directed      Comments:      Establish with OB Eat 6 small meals daily    Increase activity slowly  As directed         Medication List    STOP taking these medications       cephALEXin 500 MG capsule  Commonly known as:  KEFLEX      TAKE these medications       metoCLOPramide 5 MG tablet  Commonly known as:  REGLAN  Take 1 tablet (5 mg total) by mouth 4 (four) times daily.     ondansetron 4 MG tablet  Commonly known as:  ZOFRAN  Take 1 tablet (4 mg total) by mouth every 8 (eight) hours.     promethazine 12.5 MG suppository  Commonly known as:  PHENERGAN  Place 1 suppository (12.5 mg total) rectally every 6 (six) hours as needed for nausea.       Allergies  Allergen Reactions  . Bactrim (Sulfamethoxazole W-Trimethoprim)     Constipation   . Oxycodone Itching      The results of significant diagnostics from this hospitalization (including imaging, microbiology, ancillary and laboratory) are listed below for reference.    Significant Diagnostic Studies: US Ob Comp Less 14 Wks  Mar 22, 2013   *RADIOLOGY REPORT*  Clinical Data: Pelvic pain and vaginal bleeding.  OBSTETRIC <14 WK [place] AND TRANSVAGINAL OB US  Technique:  Both transabdominal and transvaginal ultrasound  examinations were performed for complete evaluation of the gestation as well as the maternal uterus, adnexal regions, and pelvic cul-de-sac.  Transvaginal technique was performed to assess early pregnancy.  Comparison:  None.  Intrauterine gestational sac:  Visualized/normal in shape. Yolk sac: Yes Embryo: Yes Cardiac Activity: Yes Heart Rate: 195 bpm  CRL: 7.8 mm  6 w 5 d    [place] EDC: 10/19/2013  Maternal uterus/adnexae: No subchorionic hemorrhage is noted. The uterus is otherwise unremarkable in appearance.  The ovaries are within normal limits.  The right ovary measures 3.3 x 2.6 x 2.8 cm, while the left ovary measures 3.6 x 1.7 x 2.5 cm. No suspicious adnexal masses are seen; there is no evidence for ovarian torsion.  No free  fluid is seen in the pelvic cul-de-sac.  IMPRESSION: Single live intrauterine pregnancy noted, with a crown-rump length of 8 mm, corresponding to a gestational age of [redacted] weeks 5 days. This matches the gestational age of [redacted] weeks 5 days by LMP, reflecting an estimated date of delivery of October 26, 2013.   Original Report Authenticated By: Tonia Ghent, M.D.   US Ob Transvaginal  02/28/2013   *RADIOLOGY REPORT*  Clinical Data: Pelvic pain and vaginal bleeding.  OBSTETRIC <14 WK [place] AND TRANSVAGINAL OB US  Technique:  Both transabdominal and transvaginal ultrasound examinations were performed for complete evaluation of the gestation as well as the maternal uterus, adnexal regions, and pelvic cul-de-sac.  Transvaginal technique was performed to assess early pregnancy.  Comparison:  None.  Intrauterine gestational sac:  Visualized/normal in shape. Yolk sac: Yes Embryo: Yes Cardiac Activity: Yes Heart Rate: 195 bpm  CRL: 7.8 mm  6 w 5 d    [place] EDC: 10/19/2013  Maternal uterus/adnexae: No subchorionic hemorrhage is noted. The uterus is otherwise unremarkable in appearance.  The ovaries are within normal limits.  The right ovary measures 3.3 x 2.6 x 2.8 cm, while the left ovary measures 3.6 x 1.7 x 2.5 cm. No suspicious adnexal masses are seen; there is no evidence for ovarian torsion.  No free fluid is seen in the pelvic cul-de-sac.  IMPRESSION: Single live intrauterine pregnancy noted, with a crown-rump length of 8 mm, corresponding to a gestational age of [redacted] weeks 5 days. This matches the gestational age of [redacted] weeks 5 days by LMP, reflecting an estimated date of delivery of October 26, 2013.   Original Report Authenticated By: Tonia Ghent, M.D.    Microbiology: Recent Results (from the past 240 hour(s))  URINE CULTURE     Status: None   Collection Time    03/08/13 10:14 PM      Result Value Range Status   Specimen Description URINE, CLEAN CATCH   Final   Special Requests NONE   Final   Culture  Setup Time  03/09/2013 16:02   Final   Colony Count 45,000 COLONIES/ML   Final   Culture     Final   Value: Multiple bacterial morphotypes present, none predominant. Suggest appropriate recollection if clinically indicated.   Report Status 03/10/2013 FINAL   Final     Labs: Basic Metabolic Panel:  Recent Labs Lab 03/08/13 1509 03/10/13 0520 03/11/13 0527  NA 131* 136 134*  K 3.5 3.2* 4.1  CL 98 108 107  CO2 21 23 20   GLUCOSE 112* 99 98  BUN 9 3* <3*  CREATININE 0.55 0.64 0.57  CALCIUM 10.1 8.7 9.1   Liver Function Tests:  Recent Labs Lab 03/08/13 1509  AST 15  ALT 10  ALKPHOS 57  BILITOT 1.2  PROT 7.7  ALBUMIN 4.1   No results found for this basename: LIPASE, AMYLASE,  in the last 168 hours No results found for this basename: AMMONIA,  in the last 168 hours CBC:  Recent Labs Lab 03/08/13 1509 03/10/13 0520 03/10/13 1434  WBC 7.7 6.2 6.9  NEUTROABS 4.6  --   --   HGB 12.7 10.6* 11.2*  HCT 36.6 31.6* 31.5*  MCV 88.8 90.0 89.0  PLT 280 234 241   Cardiac Enzymes: No results found for this basename: CKTOTAL, CKMB, CKMBINDEX, TROPONINI,  in the last 168 hours BNP: BNP (last 3 results) No results found for this basename: PROBNP,  in the last 8760 hours CBG: No results found for this basename: GLUCAP,  in the last 168 hours     Signed:  Marlin Canary  [facility] 03/12/2013, 2:46 PM

## 2013-03-11 NOTE — Progress Notes (Signed)
Patient discharged home in stable condition. Verbalizes understanding of all discharge instructions, including home medications and follow up appointments. 

## 2013-03-14 ENCOUNTER — Emergency Department (HOSPITAL_COMMUNITY)
Admission: EM | Admit: 2013-03-14 | Discharge: 2013-03-14 | Disposition: A | Discharge status: Home / Self Care | Payer: Medicaid Other | Attending: Emergency Medicine | Admitting: Emergency Medicine

## 2013-03-14 ENCOUNTER — Encounter (HOSPITAL_COMMUNITY): Payer: Self-pay | Admitting: Cardiology

## 2013-03-14 DIAGNOSIS — R5381 Other malaise: Secondary | ICD-10-CM | POA: Insufficient documentation

## 2013-03-14 DIAGNOSIS — O9989 Other specified diseases and conditions complicating pregnancy, childbirth and the puerperium: Secondary | ICD-10-CM | POA: Insufficient documentation

## 2013-03-14 DIAGNOSIS — Z8742 Personal history of other diseases of the female genital tract: Secondary | ICD-10-CM | POA: Insufficient documentation

## 2013-03-14 DIAGNOSIS — O210 Mild hyperemesis gravidarum: Secondary | ICD-10-CM | POA: Insufficient documentation

## 2013-03-14 DIAGNOSIS — R5383 Other fatigue: Secondary | ICD-10-CM | POA: Insufficient documentation

## 2013-03-14 DIAGNOSIS — F17200 Nicotine dependence, unspecified, uncomplicated: Secondary | ICD-10-CM | POA: Insufficient documentation

## 2013-03-14 DIAGNOSIS — R110 Nausea: Secondary | ICD-10-CM | POA: Insufficient documentation

## 2013-03-14 DIAGNOSIS — Z8541 Personal history of malignant neoplasm of cervix uteri: Secondary | ICD-10-CM | POA: Insufficient documentation

## 2013-03-14 LAB — CBC WITH DIFFERENTIAL/PLATELET
Basophils Absolute: 0 10*3/uL (ref 0.0–0.1)
Eosinophils Absolute: 0 10*3/uL (ref 0.0–0.7)
Eosinophils Relative: 0 % (ref 0–5)
MCH: 31.5 pg (ref 26.0–34.0)
MCV: 89.8 fL (ref 78.0–100.0)
Neutrophils Relative %: 62 % (ref 43–77)
Platelets: 310 10*3/uL (ref 150–400)
RDW: 13.6 % (ref 11.5–15.5)
WBC: 8.3 10*3/uL (ref 4.0–10.5)

## 2013-03-14 LAB — URINALYSIS, ROUTINE W REFLEX MICROSCOPIC
Bilirubin Urine: NEGATIVE
Glucose, UA: NEGATIVE mg/dL
Hgb urine dipstick: NEGATIVE
Nitrite: NEGATIVE
Specific Gravity, Urine: 1.04 — ABNORMAL HIGH (ref 1.005–1.030)
pH: 6 (ref 5.0–8.0)

## 2013-03-14 LAB — COMPREHENSIVE METABOLIC PANEL
ALT: 9 U/L (ref 0–35)
AST: 17 U/L (ref 0–37)
Albumin: 4.6 g/dL (ref 3.5–5.2)
Calcium: 10.9 mg/dL — ABNORMAL HIGH (ref 8.4–10.5)
Sodium: 133 mEq/L — ABNORMAL LOW (ref 135–145)
Total Protein: 8.4 g/dL — ABNORMAL HIGH (ref 6.0–8.3)

## 2013-03-14 LAB — URINE MICROSCOPIC-ADD ON

## 2013-03-14 MED ORDER — METOCLOPRAMIDE HCL 10 MG PO TABS: 5.0000 mg | ORAL_TABLET | ORAL | Status: DC

## 2013-03-14 MED ORDER — SODIUM CHLORIDE 0.9 % IV BOLUS (SEPSIS)
1000.0000 mL | Freq: Once | INTRAVENOUS | Status: AC
  Administered 2013-03-14: 1000 mL via INTRAVENOUS

## 2013-03-14 MED ORDER — METOCLOPRAMIDE HCL 5 MG/ML IJ SOLN
10.0000 mg | Freq: Once | INTRAMUSCULAR | Status: AC
  Administered 2013-03-14: 10 mg via INTRAVENOUS
  Filled 2013-03-14: qty 2

## 2013-03-14 MED ORDER — PROMETHAZINE HCL 25 MG RE SUPP: 25.0000 mg | Freq: Four times a day (QID) | RECTAL | Status: DC | PRN

## 2013-03-14 NOTE — ED Notes (Signed)
Pt reports that she is [redacted] weeks pregnant and having n/v at home. States that she is unable to keep anything down. Pt with active nausea and vomiting at triage. Denies any urinary symptoms, but does reports some abd pain.

## 2013-03-14 NOTE — ED Provider Notes (Signed)
Complains of vomiting 3 times today. Patient presently [redacted] weeks pregnant.. Patient states that she could not get her antibiotics filled other than Zofran. She continues to vomit despite using Zofran. Social work called to evaluate patient  Doug Sou, MD 03/14/13 [phone]

## 2013-03-14 NOTE — ED Provider Notes (Addendum)
CSN: [id number]     Arrival date & time 03/14/13  1521 History     First MD Initiated Contact with Patient 03/14/13 1658     Chief Complaint  Patient presents with  . Emesis During Pregnancy   (Consider location/radiation/quality/duration/timing/severity/associated sxs/prior Treatment) HPI 27 year old G5P2022 at [redacted] weeks gestation by early [place], and recent admission for hyperemesis gravidarum presents with nausea and vomiting.  Patient was seen in the emergency department 7/25 for intractable nausea/vomiting that was not controlled with IV antiemetics in the ED and was admitted for symptom relief.  She was discharged 7/29.  Reports she has had continued nausea and vomiting since discharge, reporting that she has been unable to pick up reglan and phenergan rx to her at time of discharge secondary to inability to afford the medications.  Reports the zofran does not help her nausea.  Reports she is vomiting every time she tries to eat or drink anything.  Nausea is severe and nonbloody emesis is occuring 10-20x/day.  Denies abdominal pain, vaginal bleeding, dysuria or vaginal discharge. Reports she had similar symptoms in past pregnancies.  Also reports history of molar, ectopic pregnancies as well as TAB and 2 term births.  Past Medical History  Diagnosis Date  . Cancer     cervical  . Extrauterine pregnancy    Past Surgical History  Procedure Laterality Date  . Cesarean section     History reviewed. No pertinent family history. History  Substance Use Topics  . Smoking status: Current Some Day Smoker  . Smokeless tobacco: Not on file  . Alcohol Use: Yes     Comment: occasionally   OB History   Grav Para Term Preterm Abortions TAB SAB Ect Mult Living                 Review of Systems  Constitutional: Positive for fatigue. Negative for fever, chills and appetite change.  HENT: Negative for sore throat and neck stiffness.   Eyes: Negative for visual disturbance.  Respiratory: Negative  for cough and shortness of breath.   Cardiovascular: Negative for chest pain.  Gastrointestinal: Positive for nausea and vomiting. Negative for abdominal pain, blood in stool and anal bleeding.  Genitourinary: Negative for dysuria, vaginal bleeding and vaginal discharge.  Musculoskeletal: Negative for back pain.  Skin: Negative for rash.  Neurological: Negative for light-headedness, numbness and headaches.    Allergies  Bactrim and Oxycodone  Home Medications   Current Outpatient Rx  Name  Route  Sig  Dispense  Refill  . ondansetron (ZOFRAN) 4 MG tablet   Oral   Take 1 tablet (4 mg total) by mouth every 8 (eight) hours.   60 tablet   0    BP 102/75  Pulse 71  Temp(Src) 98.3 F (36.8 C) (Oral)  Resp 18  SpO2 99%  LMP 01/17/2013 Physical Exam  Nursing note and vitals reviewed. Constitutional: She is oriented to person, place, and time. She appears well-developed and well-nourished. She appears ill. No distress.  HENT:  Head: Normocephalic and atraumatic.  Eyes: Conjunctivae and EOM are normal.  Neck: Normal range of motion.  Cardiovascular: Normal rate, regular rhythm, normal heart sounds and intact distal pulses.  Exam reveals no gallop and no friction rub.   No murmur heard. Pulmonary/Chest: Effort normal and breath sounds normal. No respiratory distress. She has no wheezes. She has no rales.  Abdominal: Soft. She exhibits no distension. There is no tenderness. There is no guarding.  Musculoskeletal: She exhibits no  edema and no tenderness.  Neurological: She is alert and oriented to person, place, and time.  Skin: Skin is warm and dry. No rash noted. She is not diaphoretic. No erythema.    ED Course   Procedures (including critical care time)  Labs Reviewed  CBC WITH DIFFERENTIAL  COMPREHENSIVE METABOLIC PANEL  URINALYSIS, ROUTINE W REFLEX MICROSCOPIC   No results found. No diagnosis found.  MDM  27 year old G5P2022 at [redacted] weeks gestation by early [place], and  recent admission for hyperemesis gravidarum presents with nausea and vomiting.  No abdominal pain, vaginal bleeding, vaginal discharge or dysuria.  IUP confirmed on recent US.  Urinalysis contaminated showing many epithelial cells and bacteria, however patient just completed keflex and given asymptomatic with contaminated urine, recent treatment with no nitrites or leukocytes will not treat for uti or asymptomatic bacteruria.  Labs significant for ketonuria, mild hyponatremia.   Patient discharged 2 days ago and unable to afford phenergan and reglan with zofran not providing relief.  Patient actively vomiting on arrival.  10mg  reglan and 2 L of IVF initiated.  Patient without active vomiting after reglan, however reported continued nausea and was given 10mg  additional IV reglan on reevaluation.  Patient reported feeling better, was able to tolerate PO fluids without increased nausea or vomiting.  Discussed patient with social work who recommended patient return tomorrow to discuss obtaining medications with Child psychotherapist.  Also discussed following with [facility] hospital for OBGYN care.  Patient in understanding of this and discharged in stable condition with understanding of reasons to return.   Rhae Lerner, MD 03/15/13 1610  Rhae Lerner, MD 03/15/13 0300  Rhae Lerner, MD 03/18/13 2009

## 2013-03-14 NOTE — ED Notes (Signed)
Spoke with pt re: need to get Rxs filled and need for pre-natal care.  Instructed pt to return to ED in am and ask to speak with the RN CM re: med assistance.  Pt states that she has applied for Medicaid it is pending.  She has the list of OB providers she received at d/c on 7/28 and plans to f/u with the OB clinic at [facility].  Pt reports a posiitve, supportive relationship with her parents here in [place], and while they are not in a position to help her financially, they provide emotional support and can provide her with transportation to/from MD appointments.  Pt feeling very ill at the time of CSW visit.  Emotional support visit.

## 2013-03-15 NOTE — Care Management (Signed)
Called by ED triage for medication assistance.  Patient admittedly has zofran at home from a previous prescrition.  She was given a prescription for Reglan and Phenergan suppopsitories 03/14/13.  Reglan is on the $4 list at [facility] and patient was given this information.

## 2013-03-16 LAB — URINE CULTURE

## 2013-03-18 NOTE — ED Provider Notes (Signed)
I have personally seen and examined the patient.  I have discussed the plan of care with the resident.  I have reviewed the documentation on PMH/FH/Soc. History.  I have reviewed the documentation of the resident and agree.  Giang Hemme, MD 03/18/13 2359 

## 2013-03-18 NOTE — Care Management Note (Signed)
    Page 1 of 1   03/15/2013     9:15:18 AM   CARE MANAGEMENT NOTE 03/15/2013  Patient:  [facility]   Account Number:  [id number]  Date Initiated:  03/15/2013  Documentation initiated by:  Dixie Coppa  Subjective/Objective Assessment:     Action/Plan:   Anticipated DC Date:     Anticipated DC Plan:           Choice offered to / List presented to:             Status of service:   Medicare Important Message given?   (If response is "NO", the following Medicare IM given date fields will be blank) Date Medicare IM given:   Date Additional Medicare IM given:    Discharge Disposition:    Per UR Regulation:    If discussed at Long Length of Stay Meetings, dates discussed:    Comments:  Called by ED triage for medication assistance.  Patient admittedly has zofran at home from a previous prescrition. She was given a prescription for Reglan and Phenergan suppopsitories 03/14/13.  Reglan is on the $4 list at [facility] and patient was given this information.

## 2013-07-23 ENCOUNTER — Emergency Department (HOSPITAL_COMMUNITY)
Admission: EM | Admit: 2013-07-23 | Discharge: 2013-07-23 | Disposition: A | Discharge status: Home / Self Care | Payer: Medicaid Other | Attending: Emergency Medicine | Admitting: Emergency Medicine

## 2013-07-23 ENCOUNTER — Encounter (HOSPITAL_COMMUNITY): Payer: Self-pay | Admitting: Emergency Medicine

## 2013-07-23 DIAGNOSIS — R112 Nausea with vomiting, unspecified: Secondary | ICD-10-CM | POA: Insufficient documentation

## 2013-07-23 DIAGNOSIS — F17200 Nicotine dependence, unspecified, uncomplicated: Secondary | ICD-10-CM | POA: Insufficient documentation

## 2013-07-23 DIAGNOSIS — R51 Headache: Secondary | ICD-10-CM | POA: Insufficient documentation

## 2013-07-23 DIAGNOSIS — Z8541 Personal history of malignant neoplasm of cervix uteri: Secondary | ICD-10-CM | POA: Insufficient documentation

## 2013-07-23 DIAGNOSIS — R42 Dizziness and giddiness: Secondary | ICD-10-CM | POA: Insufficient documentation

## 2013-07-23 DIAGNOSIS — R519 Headache, unspecified: Secondary | ICD-10-CM

## 2013-07-23 DIAGNOSIS — H53149 Visual discomfort, unspecified: Secondary | ICD-10-CM | POA: Insufficient documentation

## 2013-07-23 DIAGNOSIS — R110 Nausea: Secondary | ICD-10-CM | POA: Insufficient documentation

## 2013-07-23 HISTORY — DX: Dizziness and giddiness: R42

## 2013-07-23 MED ORDER — KETOROLAC TROMETHAMINE 30 MG/ML IJ SOLN
30.0000 mg | Freq: Once | INTRAMUSCULAR | Status: AC
  Administered 2013-07-23: 30 mg via INTRAVENOUS
  Filled 2013-07-23: qty 1

## 2013-07-23 MED ORDER — MECLIZINE HCL 25 MG PO TABS
25.0000 mg | ORAL_TABLET | Freq: Once | ORAL | Status: AC
  Administered 2013-07-23: 25 mg via ORAL
  Filled 2013-07-23: qty 1

## 2013-07-23 MED ORDER — IBUPROFEN 800 MG PO TABS: 800.0000 mg | ORAL_TABLET | Freq: Three times a day (TID) | ORAL | Status: DC

## 2013-07-23 MED ORDER — SODIUM CHLORIDE 0.9 % IV BOLUS (SEPSIS)
1000.0000 mL | INTRAVENOUS | Status: AC
  Administered 2013-07-23: 1000 mL via INTRAVENOUS

## 2013-07-23 MED ORDER — ONDANSETRON HCL 4 MG/2ML IJ SOLN
4.0000 mg | Freq: Once | INTRAMUSCULAR | Status: AC
  Administered 2013-07-23: 4 mg via INTRAVENOUS
  Filled 2013-07-23: qty 2

## 2013-07-23 MED ORDER — ONDANSETRON HCL 4 MG PO TABS: 4.0000 mg | ORAL_TABLET | Freq: Four times a day (QID) | ORAL | Status: DC

## 2013-07-23 MED ORDER — MECLIZINE HCL 12.5 MG PO TABS: 12.5000 mg | ORAL_TABLET | Freq: Three times a day (TID) | ORAL | Status: DC | PRN

## 2013-07-23 NOTE — ED Provider Notes (Signed)
CSN: [id number]     Arrival date & time 07/23/13  1335 History   First MD Initiated Contact with Patient 07/23/13 1621     Chief Complaint  Patient presents with  . Dizziness   (Consider location/radiation/quality/duration/timing/severity/associated sxs/prior Treatment) HPI Pt is a 27yo female with hx of vertigo c/o worsening symptoms today that started when pt woke up this morning. States she went to stand up today and became more dizzy than she normally gets, states "room was spinning."  Pt also states symptoms are so severe this time, she has a gradually worsening aching throbbing frontal headache, 9/10, associated with nausea and photophobia.  When pt initially giving pain scale, she stated she was not in as much pain but more dizzy and nauseated.  Also reports 1 episode of vomiting today.  States she normally takes antivert but she is out. Has been taking generic nausea medication and tylenol w/o relief.  Denies recent illness, sick contacts, or recent travel.  Pt does report moving from [facility] 21mo ago and has noticed increased frequency of her vertigo since moving.  Does not recall any increased stress but does admit to starting a new job.    Past Medical History  Diagnosis Date  . Cancer     cervical  . Extrauterine pregnancy   . Vertigo    Past Surgical History  Procedure Laterality Date  . Cesarean section     History reviewed. No pertinent family history. History  Substance Use Topics  . Smoking status: Current Some Day Smoker  . Smokeless tobacco: Not on file  . Alcohol Use: Yes     Comment: occasionally   OB History   Grav Para Term Preterm Abortions TAB SAB Ect Mult Living                 Review of Systems  Constitutional: Negative for fever and chills.  Eyes: Positive for photophobia. Negative for pain and visual disturbance.  Respiratory: Negative for shortness of breath.   Cardiovascular: Negative for chest pain.  Gastrointestinal: Positive for nausea and vomiting.  Negative for abdominal pain and diarrhea.  Neurological: Positive for dizziness and headaches.  All other systems reviewed and are negative.    Allergies  Bactrim and Oxycodone  Home Medications   Current Outpatient Rx  Name  Route  Sig  Dispense  Refill  . ibuprofen (ADVIL,MOTRIN) 800 MG tablet   Oral   Take 1 tablet (800 mg total) by mouth 3 (three) times daily.   21 tablet   0   . meclizine (ANTIVERT) 12.5 MG tablet   Oral   Take 1 tablet (12.5 mg total) by mouth 3 (three) times daily as needed for dizziness.   30 tablet   0   . ondansetron (ZOFRAN) 4 MG tablet   Oral   Take 1 tablet (4 mg total) by mouth every 6 (six) hours.   12 tablet   0    BP 116/71  Pulse 74  Temp(Src) 98.2 F (36.8 C) (Oral)  Resp 18  Wt 119 lb 7.8 oz (54.2 kg)  SpO2 100% Physical Exam  Nursing note and vitals reviewed. Constitutional: She is oriented to person, place, and time. She appears well-developed and well-nourished.  Pt lying in darkened room, sunglasses on. Appears uncomfortable.   HENT:  Head: Normocephalic and atraumatic.  Eyes: Conjunctivae and EOM are normal. Pupils are equal, round, and reactive to light. Right eye exhibits no discharge. Left eye exhibits no discharge. No scleral icterus.  Neck: Normal range of motion. Neck supple.  Cardiovascular: Normal rate, regular rhythm and normal heart sounds.   Pulmonary/Chest: Effort normal and breath sounds normal. No respiratory distress. She has no wheezes. She has no rales. She exhibits no tenderness.  Abdominal: Soft. Bowel sounds are normal. She exhibits no distension and no mass. There is no tenderness. There is no rebound and no guarding.  Musculoskeletal: Normal range of motion.  Neurological: She is alert and oriented to person, place, and time. She has normal strength. No cranial nerve deficit or sensory deficit. Coordination normal. GCS eye subscore is 4. GCS verbal subscore is 5. GCS motor subscore is 6.  Skin: Skin is  warm and dry.  Psychiatric: She has a normal mood and affect. Her behavior is normal.    ED Course  Procedures (including critical care time) Labs Review Labs Reviewed - No data to display Imaging Review No results found.  EKG Interpretation   None       MDM   1. Headache   2. Vertigo    Pt with hx of vertigo c/o worsening vertigo, which today caused a gradual onset of headache.  Pt appears uncomfortable, with sunglasses on in darkened room due to photophobia.  Neuro exam: unremarkable.   Pt states her dizziness if of more concern than her HA upon arrival.  Not concerned for emergent process taking placed at this time. Not concerned for [facility], CVA/TIA. Do not believe imaging or further workup needed at this time. Tx: fluids, meclizine, zofran.    Pt stated dizziness and nausea had improved.  Pt still c/o HA.  Toradol given.  Pt stated her HA did improve and she feels comfortable being discharged home to f/u with PCP.  Return precautions provided. Rx: meclizine, zofran, and ibuprofen. Pt verbalized understanding and agreement with tx plan. Resource guide also provided.    Junius Finner, PA-C 07/24/13 [phone]

## 2013-07-23 NOTE — ED Notes (Signed)
Pt reports dizziness and HA since this AM with 2 episodes of vomiting. Hx of vertigo, but states she is out of antivert. Pt reports sensitivity to light. AO x4. Neuro intact. Family at bedside.

## 2013-07-23 NOTE — ED Notes (Signed)
Pt c/o frequent episodes of dizziness from vertigo lately with more sever this am; pt sts "room spinning" and HA with photophobia and some nausea

## 2013-07-25 NOTE — ED Provider Notes (Signed)
Medical screening examination/treatment/procedure(s) were performed by non-physician practitioner and as supervising physician I was immediately available for consultation/collaboration.  EKG Interpretation   None         Audree Camel, MD 07/25/13 1241

## 2013-08-26 ENCOUNTER — Emergency Department (HOSPITAL_COMMUNITY)
Admission: EM | Admit: 2013-08-26 | Discharge: 2013-08-26 | Disposition: A | Discharge status: Home / Self Care | Payer: Medicaid Other | Attending: Emergency Medicine | Admitting: Emergency Medicine

## 2013-08-26 ENCOUNTER — Emergency Department (INDEPENDENT_AMBULATORY_CARE_PROVIDER_SITE_OTHER)
Admission: EM | Admit: 2013-08-26 | Discharge: 2013-08-26 | Disposition: A | Discharge status: Home / Self Care | Payer: Medicaid Other | Source: Home / Self Care

## 2013-08-26 ENCOUNTER — Encounter (HOSPITAL_COMMUNITY): Payer: Self-pay | Admitting: Emergency Medicine

## 2013-08-26 ENCOUNTER — Emergency Department (HOSPITAL_COMMUNITY): Payer: Medicaid Other

## 2013-08-26 DIAGNOSIS — N750 Cyst of Bartholin's gland: Secondary | ICD-10-CM

## 2013-08-26 DIAGNOSIS — R519 Headache, unspecified: Secondary | ICD-10-CM

## 2013-08-26 DIAGNOSIS — F17200 Nicotine dependence, unspecified, uncomplicated: Secondary | ICD-10-CM | POA: Insufficient documentation

## 2013-08-26 DIAGNOSIS — R51 Headache: Secondary | ICD-10-CM

## 2013-08-26 DIAGNOSIS — G43909 Migraine, unspecified, not intractable, without status migrainosus: Secondary | ICD-10-CM | POA: Insufficient documentation

## 2013-08-26 DIAGNOSIS — H9319 Tinnitus, unspecified ear: Secondary | ICD-10-CM | POA: Insufficient documentation

## 2013-08-26 DIAGNOSIS — Z791 Long term (current) use of non-steroidal anti-inflammatories (NSAID): Secondary | ICD-10-CM | POA: Insufficient documentation

## 2013-08-26 DIAGNOSIS — Z3202 Encounter for pregnancy test, result negative: Secondary | ICD-10-CM | POA: Insufficient documentation

## 2013-08-26 DIAGNOSIS — Z8541 Personal history of malignant neoplasm of cervix uteri: Secondary | ICD-10-CM | POA: Insufficient documentation

## 2013-08-26 LAB — CBC
HEMATOCRIT: 34.6 % — AB (ref 36.0–46.0)
Hemoglobin: 11.8 g/dL — ABNORMAL LOW (ref 12.0–15.0)
MCH: 31.9 pg (ref 26.0–34.0)
MCHC: 34.1 g/dL (ref 30.0–36.0)
MCV: 93.5 fL (ref 78.0–100.0)
PLATELETS: 284 10*3/uL (ref 150–400)
RBC: 3.7 MIL/uL — ABNORMAL LOW (ref 3.87–5.11)
RDW: 12.9 % (ref 11.5–15.5)
WBC: 8.6 10*3/uL (ref 4.0–10.5)

## 2013-08-26 LAB — POCT I-STAT, CHEM 8
BUN: 23 mg/dL (ref 6–23)
CALCIUM ION: 1.16 mmol/L (ref 1.12–1.23)
Chloride: 106 mEq/L (ref 96–112)
Creatinine, Ser: 0.8 mg/dL (ref 0.50–1.10)
GLUCOSE: 101 mg/dL — AB (ref 70–99)
HEMATOCRIT: 36 % (ref 36.0–46.0)
Hemoglobin: 12.2 g/dL (ref 12.0–15.0)
Potassium: 3.4 mEq/L — ABNORMAL LOW (ref 3.7–5.3)
Sodium: 141 mEq/L (ref 137–147)
TCO2: 25 mmol/L (ref 0–100)

## 2013-08-26 LAB — POCT PREGNANCY, URINE: PREG TEST UR: NEGATIVE

## 2013-08-26 MED ORDER — METOCLOPRAMIDE HCL 5 MG/ML IJ SOLN
10.0000 mg | Freq: Once | INTRAMUSCULAR | Status: AC
  Administered 2013-08-26: 10 mg via INTRAMUSCULAR
  Filled 2013-08-26: qty 2

## 2013-08-26 MED ORDER — PROMETHAZINE HCL 25 MG PO TABS: 25.0000 mg | ORAL_TABLET | Freq: Four times a day (QID) | ORAL | Status: DC | PRN

## 2013-08-26 MED ORDER — KETOROLAC TROMETHAMINE 60 MG/2ML IM SOLN
60.0000 mg | Freq: Once | INTRAMUSCULAR | Status: AC
  Administered 2013-08-26: 60 mg via INTRAMUSCULAR
  Filled 2013-08-26: qty 2

## 2013-08-26 MED ORDER — DIPHENHYDRAMINE HCL 50 MG/ML IJ SOLN
25.0000 mg | Freq: Once | INTRAMUSCULAR | Status: AC
  Administered 2013-08-26: 25 mg via INTRAMUSCULAR
  Filled 2013-08-26: qty 1

## 2013-08-26 NOTE — ED Notes (Signed)
C/o migraine headache for 2 weeks, no relief with pain meds.  Pt also having pain due to a cyst in the vaginal area. Recurrent problem off/on for 2 years. Denies any drainage.

## 2013-08-26 NOTE — ED Provider Notes (Signed)
Medical screening examination/treatment/procedure(s) were performed by resident physician or non-physician practitioner and as supervising physician I was immediately available for consultation/collaboration.   Pauline Good MD.   Billy Fischer, MD 08/26/13 2125

## 2013-08-26 NOTE — ED Notes (Signed)
Presents with headache for 2 weeks seen here and prescribed Ibuprofen and zofran, zofran is relieving nausea but Ibuprofen is no longer helping with headache. Headache on left side associated with light and sound sensitivity. Alert, oriented, answers all questions appropriately. Reports blurred vision on started Friday when the ibuprofen stopped helping with the pain. Also reports bartholins cyst on right labia. Headache never went away from 2 weeks ago. Dizziness has gotten better.

## 2013-08-26 NOTE — ED Provider Notes (Signed)
CSN: [id number]     Arrival date & time 08/26/13  2012 History   First MD Initiated Contact with Patient 08/26/13 2100     Chief Complaint  Patient presents with  . 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 Patient states she has been having headaches intermittently x 2 weeks, usually relieved with ibuprofen. Patient states this headache began 3 days ago and has been constant since onset without relief from ibuprofen. Patient endorses associated photophobia, phonophobia, intermittent blurry vision b/l, and sporadic tinnitus. She has also had nausea that has been well controlled with PO zofran. She denies associated vertigo and has not needed to take the meclizine previously prescribed her PRN for symptoms. Patient further denies head injury/trauma, fever, neck pain/stiffness, difficulty speaking or swallowing, vomiting, CP, SOB, numbness/tingling, and weakness.  The history is provided by the patient. No language interpreter was used.    Past Medical History  Diagnosis Date  . Cancer     cervical  . Extrauterine pregnancy   . Vertigo    Past Surgical History  Procedure Laterality Date  . Cesarean section     History reviewed. No pertinent family history. History  Substance Use Topics  . Smoking status: Current Some Day Smoker  . Smokeless tobacco: Not on file  . Alcohol Use: Yes     Comment: occasionally   OB History   Grav Para Term Preterm Abortions TAB SAB Ect Mult Living                 Review of Systems  Constitutional: Negative for fever.  HENT: Positive for tinnitus. Negative for trouble swallowing.   Eyes: Positive for visual disturbance.  Respiratory: Negative for shortness of breath.   Cardiovascular: Negative for chest pain.  Gastrointestinal: Positive for nausea. Negative for vomiting.   Musculoskeletal: Negative for neck pain and neck stiffness.  Neurological: Positive for headaches. Negative for dizziness, syncope, speech difficulty, weakness, light-headedness and numbness.  All other systems reviewed and are negative.    Allergies  Bactrim and Oxycodone  Home Medications   Current Outpatient Rx  Name  Route  Sig  Dispense  Refill  . ibuprofen (ADVIL,MOTRIN) 800 MG tablet   Oral   Take 1 tablet (800 mg total) by mouth 3 (three) times daily.   21 tablet   0   . meclizine (ANTIVERT) 12.5 MG tablet   Oral   Take 1 tablet (12.5 mg total) by mouth 3 (three) times daily as needed for dizziness.   30 tablet   0   . ondansetron (ZOFRAN) 4 MG tablet   Oral   Take 1 tablet (4 mg total) by mouth every 6 (six) hours.   12 tablet   0   . promethazine (PHENERGAN) 25 MG tablet   Oral   Take 1 tablet (25 mg total) by mouth every 6 (six) hours as needed for nausea or vomiting (Take as needed for persistent headache instead of zofran).   12 tablet   0    BP 105/68  Pulse 79  Temp(Src) 98.3 F (36.8 C) (Oral)  Resp 16  Wt 119 lb 7 oz (54.176 kg)  SpO2 100%  LMP 08/11/2013  Physical Exam  Nursing note and vitals reviewed. Constitutional: She is oriented to person, place, and time. She appears well-developed and well-nourished. No distress.  HENT:  Head: Normocephalic and atraumatic.  Mouth/Throat: Oropharynx is  clear and moist. No oropharyngeal exudate.  Eyes: Conjunctivae and EOM are normal. Pupils are equal, round, and reactive to light. No scleral icterus.  No horizontal nystagmus  Neck: Normal range of motion. Neck supple.  No nuchal rigidity or meningismus  Cardiovascular: Normal rate, regular rhythm and normal heart sounds.   Pulmonary/Chest: Effort normal. No respiratory distress. She has no wheezes. She has no rales.  Musculoskeletal: Normal range of motion.  Neurological: She is alert and oriented to person, place, and time. She has normal  reflexes. She displays normal reflexes. No cranial nerve deficit. She exhibits normal muscle tone. Coordination normal.  GCS 15. Patient speaks in focal oriented sentences. No cranial nerve deficits appreciated; symmetric eyebrow raise, equal tongue protrusion, no facial drooping, and symmetric shoulder shrug. No gross sensory or motor deficits appreciated. Patient ambulatory with normal gait. She moves her extremities without ataxia.  Skin: Skin is warm and dry. No rash noted. She is not diaphoretic. No erythema. No pallor.  Psychiatric: She has a normal mood and affect. Her behavior is normal.    ED Course  Procedures (including critical care time) Labs Review Labs Reviewed  CBC - Abnormal; Notable for the following:    RBC 3.70 (*)    Hemoglobin 11.8 (*)    HCT 34.6 (*)    All other components within normal limits  POCT I-STAT, CHEM 8 - Abnormal; Notable for the following:    Potassium 3.4 (*)    Glucose, Bld 101 (*)    All other components within normal limits  POCT PREGNANCY, URINE   Imaging Review Ct Head Wo Contrast  08/26/2013   CLINICAL DATA:  Two week history of headache unrelieved with medications. Photophobia. History of migraines.  EXAM: CT HEAD WITHOUT CONTRAST  TECHNIQUE: Contiguous axial images were obtained from the base of the skull through the vertex without intravenous contrast.  COMPARISON:  None.  FINDINGS: Ventricular system normal in size and appearance for age. No mass lesion. No midline shift. No acute hemorrhage or hematoma. No extra-axial fluid collections. No evidence of acute infarction. No focal brain parenchymal abnormalities.  No focal osseous abnormality involving the skull. Visualized paranasal sinuses, bilateral mastoid air cells, and bilateral middle ear cavities well-aerated.  IMPRESSION: Normal examination.   Electronically Signed   By: Evangeline Dakin M.D.   On: 08/26/2013 22:17    EKG Interpretation   None       MDM   1. Migraine    3430 -  28 year old female presents for migraine headache that has been persistent x3 days without relief from ibuprofen. Headache is without thunderclap onset and patient denies neck pain or stiffness as well as fever. Neurologic exam today is nonfocal. No nuchal rigidity or meningismus. My suspicion for acute intracranial processes is low given lack of injury/trauma and reassuring physical examination. However, as patient has been seen multiple times for headache complaints, will further evaluate with CT. Toradol, Reglan, and benadryl ordered for symptoms.  2300 - Patient is pain free after Toradol, reglan, and benadryl. CT shows no acute intracranial process; no hemorrhage, hydrocephalus, mass/lesion, or midline shift. Patient stable and appropriate for d/c with Rx for phenergan for persistent symptoms. Will also refer to neurology for further evaluation of frequent headaches. Return precautions discussed and patient agreeable to plan with no unaddressed concerns.   Filed Vitals:   08/26/13 2020 08/26/13 2132 08/26/13 2133 08/26/13 2252  BP: 123/85 109/70  105/68  Pulse: 90  72 79  Temp: 98.4 F (36.9  C)   98.3 F (36.8 C)  TempSrc: Oral   Oral  Resp: 16   16  Weight: 119 lb 7 oz (54.176 kg)     SpO2: 100%  100% 100%     Antonietta Breach, PA-C 08/26/13 2306

## 2013-08-26 NOTE — ED Provider Notes (Signed)
Medical screening examination/treatment/procedure(s) were performed by non-physician practitioner and as supervising physician I was immediately available for consultation/collaboration.  EKG Interpretation   None         Ezequiel Essex, MD 08/26/13 2315

## 2013-08-26 NOTE — ED Provider Notes (Signed)
CSN: [id number]     Arrival date & time 08/26/13  1746 History   None    Chief Complaint  Patient presents with  . Migraine  . Cyst   (Consider location/radiation/quality/duration/timing/severity/associated sxs/prior Treatment)  HPI  Patient is a 28 year old female presenting tonight with reports of a headache and blurred vision x2 weeks. Patient states no history of migraines however was seen and treated at time for vertigo of which she has a 18-year-old portage history. Sensation is told she had a migraine headache while at that time D. Patient states she's been taking 800 mg of ibuprofen with no relief. Patient reports positive photophobia and difficulty with sound. Denies any aura denies nausea or vomiting, states "Zofran" is working.   Past Medical History  Diagnosis Date  . Cancer     cervical  . Extrauterine pregnancy   . Vertigo    Past Surgical History  Procedure Laterality Date  . Cesarean section     History reviewed. No pertinent family history. History  Substance Use Topics  . Smoking status: Current Some Day Smoker  . Smokeless tobacco: Not on file  . Alcohol Use: Yes     Comment: occasionally   OB History   Grav Para Term Preterm Abortions TAB SAB Ect Mult Living                 Review of Systems  Constitutional: Negative.   Eyes: Positive for photophobia and visual disturbance. Negative for pain, discharge and redness.  Respiratory: Negative.   Cardiovascular: Negative.   Gastrointestinal: Negative.  Negative for nausea and vomiting.  Endocrine: Negative.   Genitourinary: Positive for vaginal pain.       Bartholin's cyst of right labia. Patient has taken antibiotics in the past to treat.  Musculoskeletal: Negative.   Skin: Negative.   Allergic/Immunologic: Negative.   Neurological: Positive for headaches. Negative for dizziness, speech difficulty, weakness, light-headedness and numbness.       States no problems with vertigo today.  Hematological:  Negative.   Psychiatric/Behavioral: Negative.      Allergies  Bactrim and Oxycodone  Home Medications   Current Outpatient Rx  Name  Route  Sig  Dispense  Refill  . meclizine (ANTIVERT) 12.5 MG tablet   Oral   Take 1 tablet (12.5 mg total) by mouth 3 (three) times daily as needed for dizziness.   30 tablet   0   . ondansetron (ZOFRAN) 4 MG tablet   Oral   Take 1 tablet (4 mg total) by mouth every 6 (six) hours.   12 tablet   0   . ibuprofen (ADVIL,MOTRIN) 800 MG tablet   Oral   Take 1 tablet (800 mg total) by mouth 3 (three) times daily.   21 tablet   0    BP 112/76  Pulse 64  Temp(Src) 98.1 F (36.7 C) (Oral)  Resp 16  SpO2 100%  LMP 08/11/2013  Physical Exam  Constitutional: She is oriented to person, place, and time. She appears well-developed and well-nourished. No distress.  HENT:  Head: Normocephalic and atraumatic.  Nose: Nose normal.  Mouth/Throat: Oropharynx is clear and moist. No oropharyngeal exudate.  Eyes: Conjunctivae and EOM are normal. Pupils are equal, round, and reactive to light. Right eye exhibits no discharge. Left eye exhibits no discharge. No scleral icterus.  Neck: Normal range of motion. Neck supple. No thyromegaly present.  Negative nuchal rigidity.  Cardiovascular: Normal rate, regular rhythm, normal heart sounds and intact distal pulses.  Exam  reveals no gallop and no friction rub.   No murmur heard. Pulmonary/Chest: Effort normal and breath sounds normal. No respiratory distress. She has no wheezes. She has no rales. She exhibits no tenderness.  Genitourinary:     Lymphadenopathy:    She has no cervical adenopathy.  Neurological: She is alert and oriented to person, place, and time.  Skin: Skin is warm and dry. No rash noted. She is not diaphoretic. No erythema. No pallor.    ED Course  Procedures (including critical care time) Labs Review Labs Reviewed - No data to display Imaging Review No results found.    MDM    1. Headache   2. Bartholin's gland cyst    Plan of care discussed with Dr. Juventino Slovak.  Patient to be transferred to [facility] ED for further evaluation of headache and blurred vision.    Jacqualyn Posey, NP 08/26/13 2009

## 2013-08-26 NOTE — ED Notes (Signed)
Transported to ct 

## 2013-08-26 NOTE — Discharge Instructions (Signed)
Recommend ibuprofen and Phenergan for symptoms. Followup with neurology for further evaluation of your migraine headaches. Return to the emergency department if symptoms worsen.  Migraine Headache A migraine headache is an intense, throbbing pain on one or both sides of your head. A migraine can last for 30 minutes to several hours. CAUSES  The exact cause of a migraine headache is not always known. However, a migraine may be caused when nerves in the brain become irritated and release chemicals that cause inflammation. This causes pain. Certain things may also trigger migraines, such as:  Alcohol.  Smoking.  Stress.  Menstruation.  Aged cheeses.  Foods or drinks that contain nitrates, glutamate, aspartame, or tyramine.  Lack of sleep.  Chocolate.  Caffeine.  Hunger.  Physical exertion.  Fatigue.  Medicines used to treat chest pain (nitroglycerine), birth control pills, estrogen, and some blood pressure medicines. SIGNS AND SYMPTOMS  Pain on one or both sides of your head.  Pulsating or throbbing pain.  Severe pain that prevents daily activities.  Pain that is aggravated by any physical activity.  Nausea, vomiting, or both.  Dizziness.  Pain with exposure to bright lights, loud noises, or activity.  General sensitivity to bright lights, loud noises, or smells. Before you get a migraine, you may get warning signs that a migraine is coming (aura). An aura may include:  Seeing flashing lights.  Seeing bright spots, halos, or zig-zag lines.  Having tunnel vision or blurred vision.  Having feelings of numbness or tingling.  Having trouble talking.  Having muscle weakness. DIAGNOSIS  A migraine headache is often diagnosed based on:  Symptoms.  Physical exam.  A CT scan or MRI of your head. These imaging tests cannot diagnose migraines, but they can help rule out other causes of headaches. TREATMENT Medicines may be given for pain and nausea. Medicines  can also be given to help prevent recurrent migraines.  HOME CARE INSTRUCTIONS  Only take over-the-counter or prescription medicines for pain or discomfort as directed by your health care provider. The use of long-term narcotics is not recommended.  Lie down in a dark, quiet room when you have a migraine.  Keep a journal to find out what may trigger your migraine headaches. For example, write down:  What you eat and drink.  How much sleep you get.  Any change to your diet or medicines.  Limit alcohol consumption.  Quit smoking if you smoke.  Get 7 9 hours of sleep, or as recommended by your health care provider.  Limit stress.  Keep lights dim if bright lights bother you and make your migraines worse. SEEK IMMEDIATE MEDICAL CARE IF:   Your migraine becomes severe.  You have a fever.  You have a stiff neck.  You have vision loss.  You have muscular weakness or loss of muscle control.  You start losing your balance or have trouble walking.  You feel faint or pass out.  You have severe symptoms that are different from your first symptoms. MAKE SURE YOU:   Understand these instructions.  Will watch your condition.  Will get help right away if you are not doing well or get worse. Document Released: 08/01/2005 Document Revised: 05/22/2013 Document Reviewed: 04/08/2013 [facility] Patient Information 2014 [place].

## 2013-08-27 ENCOUNTER — Telehealth: Payer: Self-pay | Admitting: *Deleted

## 2013-08-27 NOTE — Telephone Encounter (Signed)
Received al call from [facility] requesting our NPI for authorization on this patient. She is not our patient and has never been seen here before. I only see that she has been seen in the ER. I gave a one time authorization and asked that they have her change her medicaid card.Mikenna Bunkley, Kevin Fenton

## 2013-09-12 ENCOUNTER — Ambulatory Visit: Payer: Medicaid Other | Admitting: Neurology

## 2013-09-12 ENCOUNTER — Telehealth: Payer: Self-pay | Admitting: Neurology

## 2013-09-12 NOTE — Telephone Encounter (Signed)
Patient was a no show for scheduled new patient visit on 09/12/2013

## 2013-09-20 ENCOUNTER — Encounter: Payer: Self-pay | Admitting: Neurology

## 2013-09-20 ENCOUNTER — Ambulatory Visit (INDEPENDENT_AMBULATORY_CARE_PROVIDER_SITE_OTHER): Payer: Medicaid Other | Admitting: Neurology

## 2013-09-20 VITALS — BP 125/82 | HR 67 | Ht 63.5 in | Wt 123.0 lb

## 2013-09-20 DIAGNOSIS — G43109 Migraine with aura, not intractable, without status migrainosus: Secondary | ICD-10-CM | POA: Insufficient documentation

## 2013-09-20 MED ORDER — NORTRIPTYLINE HCL 10 MG PO CAPS: 10.0000 mg | ORAL_CAPSULE | Freq: Every day | ORAL | Status: DC

## 2013-09-20 NOTE — Progress Notes (Signed)
[facility]    Provider:  Dr Janann Colonel Referring Provider: Antonietta Breach, PA-C Primary Care Physician:  No PCP Per Patient  CC:  headache  HPI:  Crystal Jenkins is a 28 y.o. female here as a referral from Dr. Deborah Chalk for headache evaluation  Had episode of vertigo around 1 month ago, headaches started around that time. Now, occuring daily, notice it most often at night time. Headache is frontal, can radiate backwards, described as a pounding, gets N/V and photo/phonophobia. Gets some blurry vision with the headache. Gets some tinnitus in her ears. Gets vertigo with the headache. Will typically have at least one headache a day, starts slowly and gets progressively worse. Gets aura of a "weird feeling" prior to the headache. At worst, headache can get up to a 8-9/10. Currently taking promethazine for the headache, typically taking it once nightly. Gets good benefit from it. Gets generalized weakness with the headache, no focal motor or sensory changes. No prior history of headaches. Mother has migraines. No recent infection, no neck pain or stiffness, no head trauma. No current use of oral contraceptive.    Per ED notes, history of L sided headache. Has associated photo and phonophobia, intermittent blurry vision, sporadic tinnitus. + Nausea. 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    Review of Systems: Out of a complete 14 system review, the patient complains of only the following symptoms, and all other reviewed systems are negative. Positive blurred vision feeling hot memory loss confusion headache numbness weakness slurred speech dizziness insomnia and loss birthmark allergies  History   Social History  . Marital Status: Single    Spouse Name: N/A    Number of Children: 2  . Years of Education: Associates   Occupational History  .     Social History Main Topics  . Smoking status: Current Some Day Smoker  . Smokeless tobacco: Not on file     Comment:  social somoker  . Alcohol Use: Yes     Comment: occasionally  . Drug Use: No  . Sexual Activity: Yes    Birth Control/ Protection: None   Other Topics Concern  . Not on file   Social History Narrative   Patient is single, has 2 children   Patient is right handed   Education level is Associate's degree   Caffeine consumption  1 cup twice a week    No family history on file.  Past Medical History  Diagnosis Date  . Cancer     cervical  . Extrauterine pregnancy   . Vertigo     Past Surgical History  Procedure Laterality Date  . Cesarean section      Current Outpatient Prescriptions  Medication Sig Dispense Refill  . ibuprofen (ADVIL,MOTRIN) 800 MG tablet Take 1 tablet (800 mg total) by mouth 3 (three) times daily.  21 tablet  0  . meclizine (ANTIVERT) 12.5 MG tablet Take 1 tablet (12.5 mg total) by mouth 3 (three) times daily as needed for dizziness.  30 tablet  0  . ondansetron (ZOFRAN) 4 MG tablet Take 1 tablet (4 mg total) by mouth every 6 (six) hours.  12 tablet  0  . promethazine (PHENERGAN) 25 MG tablet Take 1 tablet (25 mg total) by mouth every 6 (six) hours as needed for nausea or vomiting (Take as needed for persistent headache instead of zofran).  12 tablet  0   No current facility-administered medications for this visit.    Allergies as of  09/20/2013 - Review Complete 09/20/2013  Allergen Reaction Noted  . Bactrim [sulfamethoxazole-trimethoprim]  02/19/2013  . Oxycodone Itching 02/19/2013    Vitals: BP 125/82  Pulse 67  Ht 5' 3.5" (1.613 m)  Wt 123 lb (55.792 kg)  BMI 21.44 kg/m2  LMP 08/11/2013 Last Weight:  Wt Readings from Last 1 Encounters:  09/20/13 123 lb (55.792 kg)   Last Height:   Ht Readings from Last 1 Encounters:  09/20/13 5' 3.5" (1.613 m)     Physical exam: Exam: Gen: NAD, conversant Eyes: anicteric sclerae, moist conjunctivae HENT: Atraumatic, oropharynx clear Neck: Trachea midline; supple,  Lungs: CTA, no wheezing, rales,  rhonic                          CV: RRR, no MRG Abdomen: Soft, non-tender;  Extremities: No peripheral edema  Skin: Normal temperature, no rash,  Psych: Appropriate affect, pleasant  Neuro: MS: AA&Ox3, appropriately interactive, normal affect   Speech: fluent w/o paraphasic error  Memory: good recent and remote recall  CN: PERRL, EOMI no nystagmus, no ptosis, sensation intact to LT V1-V3 bilat, face symmetric, no weakness, hearing grossly intact, palate elevates symmetrically, shoulder shrug 5/5 bilat,  tongue protrudes midline, no fasiculations noted.  Motor: normal bulk and tone Strength: 5/5  In all extremities  Coord: rapid alternating and point-to-point (FNF, HTS) movements intact.  Reflexes: symmetrical, bilat downgoing toes  Sens: LT intact in all extremities  Gait: posture, stance, stride and arm-swing normal. Tandem gait intact. Able to walk on heels and toes. Romberg absent.   Assessment:  After physical and neurologic examination, review of laboratory studies, imaging, neurophysiology testing and pre-existing records, assessment will be reviewed on the problem list.  Plan:  Treatment plan and additional workup will be reviewed under Problem List.  1)Migraine with aura  28 year old woman presenting for initial evaluation of headache, associated with vertigo, photophobia and nausea and vomiting. Physical exam is overall unremarkable. Based on clinical history this is most consistent with a diagnosis of migraine with aura. As physical exam is unremarkable, there is no indication for imaging at this time. Will continue patient on promethazine as needed. Due to frequent nature of headache, will start patient on prophylactic therapy. Will start Pamelor 10 mg nightly, discussed potential side effects with patient, she expressed understanding. Patient to call back in 4 weeks to update on progress. Will followup in 4 months.   [name], DO  [facility] [address] [place],  [address]  Phone [phone] Fax [phone]

## 2013-09-20 NOTE — Patient Instructions (Signed)
Overall you are doing fairly well but I do want to suggest a few things today:   Remember to drink plenty of fluid, eat healthy meals and do not skip any meals. Try to eat protein with a every meal and eat a healthy snack such as fruit or nuts in between meals. Try to keep a regular sleep-wake schedule and try to exercise daily, particularly in the form of walking, 20-30 minutes a day, if you can.   As far as your medications are concerned, I would like to suggest the following: 1)Start taking Pamelor 10mg  nightly.  2)Continue to take the Promethazine as needed. Try to limit this to no more than once per day.   Please call our office in 4 weeks and let us know how you are doing with the addition of the pamelor.   I would like to see you back in 4 months, sooner if we need to. Please call us with any interim questions, concerns, problems, updates or refill requests.   My clinical assistant and will answer any of your questions and relay your messages to me and also relay most of my messages to you.   Our phone number is [phone]. We also have an after hours call service for urgent matters and there is a physician on-call for urgent questions. For any emergencies you know to call 911 or go to the nearest emergency room

## 2013-10-03 ENCOUNTER — Telehealth: Payer: Self-pay | Admitting: Neurology

## 2013-10-03 MED ORDER — NORTRIPTYLINE HCL 10 MG PO CAPS: 10.0000 mg | ORAL_CAPSULE | Freq: Every day | ORAL | Status: DC

## 2013-10-03 NOTE — Telephone Encounter (Signed)
Rx has been resent to [facility]

## 2013-10-03 NOTE — Telephone Encounter (Signed)
Needs recent RX for nortriptyline sent to [facility] on [address].

## 2013-10-18 ENCOUNTER — Ambulatory Visit: Payer: Medicaid Other | Admitting: Neurology

## 2013-11-04 ENCOUNTER — Other Ambulatory Visit: Payer: Self-pay | Admitting: Neurology

## 2013-11-04 ENCOUNTER — Telehealth: Payer: Self-pay | Admitting: Neurology

## 2013-11-04 MED ORDER — PROMETHAZINE HCL 25 MG PO TABS: 25.0000 mg | ORAL_TABLET | Freq: Four times a day (QID) | ORAL | Status: DC | PRN

## 2013-11-04 NOTE — Telephone Encounter (Signed)
Please let her know I sent a refill of the phenergan to her pharmacy. She should discontinue the pamelor. Thanks.

## 2013-11-04 NOTE — Telephone Encounter (Signed)
Pt calling stating that she was prescribed Pamelor 10 mg at night and she is having problems with diarrhea in the mornings. Pt states that she is still having headaches with this medication. Pt would like to know if she would be able to go back on phenergan and if so can it be sent to [facility] [address]. Please advise

## 2013-11-04 NOTE — Telephone Encounter (Signed)
Pt was prescribed Pamelor 10 mg at night during her last visit.  She states that she is having problems with having diarrhea in the mornings.  She also states she is still having headaches with this medication.  She has asked if it would be possible for her to return to the phenergan that she was previously on and if so can [facility] [address].  Please call Pt to advise.  Thank you

## 2013-11-05 NOTE — Telephone Encounter (Signed)
Called pt to inform her per Dr. Janann Colonel wanted her to stop taking the pamelor and the doctor sent the pt in a prescription for phenergan to her pharmacy. I advised the pt that if she has any other problems, questions or concerns to call the office. Pt verbalized understanding.

## 2013-11-07 ENCOUNTER — Ambulatory Visit: Payer: Medicaid Other | Admitting: Emergency Medicine

## 2013-12-01 ENCOUNTER — Encounter (HOSPITAL_COMMUNITY): Payer: Self-pay | Admitting: Emergency Medicine

## 2013-12-01 ENCOUNTER — Emergency Department (HOSPITAL_COMMUNITY): Payer: Medicaid Other

## 2013-12-01 ENCOUNTER — Emergency Department (HOSPITAL_COMMUNITY)
Admission: EM | Admit: 2013-12-01 | Discharge: 2013-12-01 | Disposition: A | Discharge status: Home / Self Care | Payer: Medicaid Other | Attending: Emergency Medicine | Admitting: Emergency Medicine

## 2013-12-01 DIAGNOSIS — O9989 Other specified diseases and conditions complicating pregnancy, childbirth and the puerperium: Secondary | ICD-10-CM | POA: Insufficient documentation

## 2013-12-01 DIAGNOSIS — O26899 Other specified pregnancy related conditions, unspecified trimester: Secondary | ICD-10-CM

## 2013-12-01 DIAGNOSIS — Z885 Allergy status to narcotic agent status: Secondary | ICD-10-CM | POA: Insufficient documentation

## 2013-12-01 DIAGNOSIS — R112 Nausea with vomiting, unspecified: Secondary | ICD-10-CM | POA: Insufficient documentation

## 2013-12-01 DIAGNOSIS — Z881 Allergy status to other antibiotic agents status: Secondary | ICD-10-CM | POA: Insufficient documentation

## 2013-12-01 DIAGNOSIS — O219 Vomiting of pregnancy, unspecified: Secondary | ICD-10-CM

## 2013-12-01 DIAGNOSIS — R109 Unspecified abdominal pain: Secondary | ICD-10-CM

## 2013-12-01 LAB — URINE MICROSCOPIC-ADD ON

## 2013-12-01 LAB — COMPREHENSIVE METABOLIC PANEL
ALT: 13 U/L (ref 0–35)
AST: 21 U/L (ref 0–37)
Albumin: 4.2 g/dL (ref 3.5–5.2)
Alkaline Phosphatase: 68 U/L (ref 39–117)
BUN: 11 mg/dL (ref 6–23)
CO2: 22 meq/L (ref 19–32)
Calcium: 9.2 mg/dL (ref 8.4–10.5)
Chloride: 101 mEq/L (ref 96–112)
Creatinine, Ser: 0.62 mg/dL (ref 0.50–1.10)
GFR calc Af Amer: >90 mL/min (ref >90)
Glucose, Bld: 90 mg/dL (ref 70–99)
Potassium: 4.1 mEq/L (ref 3.7–5.3)
SODIUM: 136 meq/L — AB (ref 137–147)
Total Bilirubin: 1 mg/dL (ref 0.3–1.2)
Total Protein: 8.2 g/dL (ref 6.0–8.3)

## 2013-12-01 LAB — WET PREP, GENITAL
Clue Cells Wet Prep HPF POC: NONE SEEN
TRICH WET PREP: NONE SEEN
YEAST WET PREP: NONE SEEN

## 2013-12-01 LAB — CBC WITH DIFFERENTIAL/PLATELET
BASOS ABS: 0 10*3/uL (ref 0.0–0.1)
BASOS PCT: 0 % (ref 0–1)
EOS PCT: 1 % (ref 0–5)
Eosinophils Absolute: 0 10*3/uL (ref 0.0–0.7)
HEMATOCRIT: 39.6 % (ref 36.0–46.0)
Hemoglobin: 13.7 g/dL (ref 12.0–15.0)
LYMPHS PCT: 35 % (ref 12–46)
Lymphs Abs: 3 10*3/uL (ref 0.7–4.0)
MCH: 31.4 pg (ref 26.0–34.0)
MCHC: 34.6 g/dL (ref 30.0–36.0)
MCV: 90.8 fL (ref 78.0–100.0)
Monocytes Absolute: 1.2 10*3/uL — ABNORMAL HIGH (ref 0.1–1.0)
Monocytes Relative: 14 % — ABNORMAL HIGH (ref 3–12)
NEUTROS ABS: 4.3 10*3/uL (ref 1.7–7.7)
Neutrophils Relative %: 50 % (ref 43–77)
PLATELETS: 316 10*3/uL (ref 150–400)
RBC: 4.36 MIL/uL (ref 3.87–5.11)
RDW: 12.8 % (ref 11.5–15.5)
WBC: 8.5 10*3/uL (ref 4.0–10.5)

## 2013-12-01 LAB — URINALYSIS, ROUTINE W REFLEX MICROSCOPIC
Bilirubin Urine: NEGATIVE
GLUCOSE, UA: NEGATIVE mg/dL
HGB URINE DIPSTICK: NEGATIVE
Ketones, ur: NEGATIVE mg/dL
Leukocytes, UA: NEGATIVE
Nitrite: NEGATIVE
PH: 8 (ref 5.0–8.0)
Protein, ur: 30 mg/dL — AB
SPECIFIC GRAVITY, URINE: 1.026 (ref 1.005–1.030)
Urobilinogen, UA: 1 mg/dL (ref 0.0–1.0)

## 2013-12-01 LAB — LIPASE, BLOOD: Lipase: 20 U/L (ref 11–59)

## 2013-12-01 LAB — HCG, QUANTITATIVE, PREGNANCY: HCG, BETA CHAIN, QUANT, S: 20295 m[IU]/mL — AB (ref <5)

## 2013-12-01 LAB — POC URINE PREG, ED: PREG TEST UR: POSITIVE — AB

## 2013-12-01 MED ORDER — ONDANSETRON 4 MG PO TBDP: 4.0000 mg | ORAL_TABLET | Freq: Three times a day (TID) | ORAL | Status: DC | PRN

## 2013-12-01 MED ORDER — SODIUM CHLORIDE 0.9 % IV BOLUS (SEPSIS)
1000.0000 mL | Freq: Once | INTRAVENOUS | Status: AC
  Administered 2013-12-01: 1000 mL via INTRAVENOUS

## 2013-12-01 MED ORDER — ONDANSETRON HCL 4 MG/2ML IJ SOLN
4.0000 mg | Freq: Once | INTRAMUSCULAR | Status: AC
  Administered 2013-12-01: 4 mg via INTRAVENOUS
  Filled 2013-12-01: qty 2

## 2013-12-01 NOTE — ED Provider Notes (Signed)
CSN: [id number]     Arrival date & time 12/01/13  1207 History   First MD Initiated Contact with Patient 12/01/13 1216     Chief Complaint  Patient presents with  . Abdominal Pain  . Nausea    (Consider location/radiation/quality/duration/timing/severity/associated sxs/prior Treatment) HPI Comments: Patient with history of ectopic pregnancy 8 years ago status post surgical removal of the right fallopian tube -- presents with complaint of nausea and vomiting and abdominal pain for the past 3 days. Patient was at work when the pain began in the epigastric area 3 days ago was associated with vomiting. Pain resolved and patient felt nauseous for the past 2 days. Yesterday patient developed a lower abdominal, midline, pressure sensation. She has continued to have nausea after eating as well as occasional vomiting. She denies fever, chest pain, shortness of breath, diarrhea, constipation. She denies hematuria or dysuria. She denies vaginal bleeding or discharge. Last menstrual period was one month ago. She has taken Phenergan at home that she is prescribed for her migraines. She says that it makes her sleep but when she wakes she continues to have the symptoms. The onset of this condition was acute. The course is constant. Aggravating factors: none. Alleviating factors: none.    Patient is a 28 y.o. female presenting with abdominal pain. The history is provided by the patient.  Abdominal Pain Associated symptoms: nausea and vomiting   Associated symptoms: no chest pain, no constipation, no cough, no diarrhea, no dysuria, no fever and no sore throat     Past Medical History  Diagnosis Date  . Cancer     cervical  . Extrauterine pregnancy   . Vertigo    Past Surgical History  Procedure Laterality Date  . Cesarean section     No family history on file. History  Substance Use Topics  . Smoking status: Current Some Day Smoker    Types: Cigarettes  . Smokeless tobacco: Not on file   Comment: social somoker  . Alcohol Use: Yes     Comment: occasionally   OB History   Grav Para Term Preterm Abortions TAB SAB Ect Mult Living                 Review of Systems  Constitutional: Negative for fever.  HENT: Negative for rhinorrhea and sore throat.   Eyes: Negative for redness.  Respiratory: Negative for cough.   Cardiovascular: Negative for chest pain.  Gastrointestinal: Positive for nausea, vomiting and abdominal pain. Negative for diarrhea, constipation and blood in stool.  Genitourinary: Negative for dysuria.  Musculoskeletal: Negative for myalgias.  Skin: Negative for rash.  Neurological: Negative for headaches.    Allergies  Bactrim and Oxycodone  Home Medications   Prior to Admission medications   Medication Sig Start Date End Date Taking? Authorizing Provider  ibuprofen (ADVIL,MOTRIN) 800 MG tablet Take 1 tablet (800 mg total) by mouth 3 (three) times daily. 07/23/13  Yes Noland Fordyce, PA-C  meclizine (ANTIVERT) 25 MG tablet Take 25 mg by mouth 3 (three) times daily as needed for dizziness.   Yes Historical Provider, MD  promethazine (PHENERGAN) 25 MG tablet Take 1 tablet (25 mg total) by mouth every 6 (six) hours as needed for nausea or vomiting. 11/04/13  Yes Hulen Luster, DO   BP 113/72  Pulse 87  Temp(Src) 98.6 F (37 C) (Oral)  Resp 18  SpO2 100%  Physical Exam  Nursing note and vitals reviewed. Constitutional: She appears well-developed and well-nourished.  HENT:  Head:  Normocephalic and atraumatic.  Eyes: Conjunctivae are normal. Right eye exhibits no discharge. Left eye exhibits no discharge.  Neck: Normal range of motion. Neck supple.  Cardiovascular: Normal rate, regular rhythm and normal heart sounds.   Pulmonary/Chest: Effort normal and breath sounds normal.  Abdominal: Soft. Bowel sounds are normal. She exhibits no distension. There is tenderness in the suprapubic area. There is no rigidity, no rebound, no guarding, no CVA  tenderness, no tenderness at McBurney's point and negative Murphy's sign.  Genitourinary: Vagina normal. There is no rash or tenderness on the right labia. There is no rash or tenderness on the left labia. Uterus is enlarged. Uterus is not tender. Cervix exhibits no motion tenderness and no discharge. Right adnexum displays no mass and no tenderness. Left adnexum displays no mass and no tenderness. No vaginal discharge found.    Neurological: She is alert.  Skin: Skin is warm and dry.  Psychiatric: She has a normal mood and affect.    ED Course  Procedures (including critical care time) Labs Review Labs Reviewed  WET PREP, GENITAL - Abnormal; Notable for the following:    WBC, Wet Prep HPF POC FEW (*)    All other components within normal limits  CBC WITH DIFFERENTIAL - Abnormal; Notable for the following:    Monocytes Relative 14 (*)    Monocytes Absolute 1.2 (*)    All other components within normal limits  COMPREHENSIVE METABOLIC PANEL - Abnormal; Notable for the following:    Sodium 136 (*)    All other components within normal limits  URINALYSIS, ROUTINE W REFLEX MICROSCOPIC - Abnormal; Notable for the following:    APPearance CLOUDY (*)    Protein, ur 30 (*)    All other components within normal limits  HCG, QUANTITATIVE, PREGNANCY - Abnormal; Notable for the following:    hCG, Beta Chain, Quant, S 20295 (*)    All other components within normal limits  URINE MICROSCOPIC-ADD ON - Abnormal; Notable for the following:    Squamous Epithelial / LPF FEW (*)    Bacteria, UA MANY (*)    All other components within normal limits  POC URINE PREG, ED - Abnormal; Notable for the following:    Preg Test, Ur POSITIVE (*)    All other components within normal limits  GC/CHLAMYDIA PROBE AMP  LIPASE, BLOOD  POC URINE PREG, ED    Imaging Review US Ob Comp Less 14 Wks  12/01/2013   CLINICAL DATA:  Abdominal pain. Positive pregnancy test. Unsure of LMP  EXAM: OBSTETRIC <14 WK [place] AND  TRANSVAGINAL OB US  TECHNIQUE: Both transabdominal and transvaginal ultrasound examinations were performed for complete evaluation of the gestation as well as the maternal uterus, adnexal regions, and pelvic cul-de-sac. Transvaginal technique was performed to assess early pregnancy.  COMPARISON:  None.  FINDINGS: Intrauterine gestational sac: Probable single sac which is somewhat elongated and flattened in shape  Yolk sac:  Not visualized  Embryo:  Not visualized  MSD:  15  mm   6 w   2  d  Maternal uterus/adnexae: Normal appearance of both ovaries. No mass or free fluid identified.  IMPRESSION: Probable early intrauterine gestational sac measuring approximately 6 weeks. Sac shape is somewhat elongated and flattened, which is a poor prognostic sign. Recommend continued followup of quantitative HCG levels, with followup ultrasound in 10-14 days.  No adnexal mass or free fluid identified.   Electronically Signed   By: Earle Gell M.D.   On: 12/01/2013 14:18  US Ob Transvaginal  12/01/2013   CLINICAL DATA:  Abdominal pain. Positive pregnancy test. Unsure of LMP  EXAM: OBSTETRIC <14 WK [place] AND TRANSVAGINAL OB US  TECHNIQUE: Both transabdominal and transvaginal ultrasound examinations were performed for complete evaluation of the gestation as well as the maternal uterus, adnexal regions, and pelvic cul-de-sac. Transvaginal technique was performed to assess early pregnancy.  COMPARISON:  None.  FINDINGS: Intrauterine gestational sac: Probable single sac which is somewhat elongated and flattened in shape  Yolk sac:  Not visualized  Embryo:  Not visualized  MSD:  15  mm   6 w   2  d  Maternal uterus/adnexae: Normal appearance of both ovaries. No mass or free fluid identified.  IMPRESSION: Probable early intrauterine gestational sac measuring approximately 6 weeks. Sac shape is somewhat elongated and flattened, which is a poor prognostic sign. Recommend continued followup of quantitative HCG levels, with followup  ultrasound in 10-14 days.  No adnexal mass or free fluid identified.   Electronically Signed   By: Earle Gell M.D.   On: 12/01/2013 14:18     EKG Interpretation None      12:53 PM Patient seen and examined. Work-up initiated. Medications ordered.   Vital signs reviewed and are as follows: Filed Vitals:   12/01/13 1222  BP: 113/72  Pulse: 87  Temp:   Resp:   BP 113/72  Pulse 87  Temp(Src) 98.6 F (37 C) (Oral)  Resp 18  SpO2 100%  UPT+. US/quant ordered. Pt informed of this.   Pelvic exam performed with NT chaperone by Quentin Cornwall PA-S2 under my supervision.   Pt informed of all results. Informed she is at higher risk of miscarriage and needs to follow-up in next 2 weeks.   She is encouraged to go to [facility]/return if worsening. Referral for [facility] outpt clinic ordered.    MDM   Final diagnoses:  Abdominal pain in pregnancy  Vomiting complicating pregnancy   Abdominal pain: IUP confirmed at 6w 2d. Appearance concerning for high risk for miscarriage. Follow-up instructions given. No RLQ tenderness to suggest appendicitis. No vaginal bleeding to suggest threatened spontaneous abortion.   Vomiting: controlled in ED. Zofran for home.   No dangerous or life-threatening conditions suspected or identified by history, physical exam, and by work-up. No indications for hospitalization identified.      Carlisle Cater, PA-C 12/01/13 [phone]

## 2013-12-01 NOTE — ED Notes (Signed)
Pt presents to department for evaluation of lower abdominal pain/pressure. Also states nausea/vomiting for several days. 5/10 pain at the time. Denies vaginal discharge. Denies urinary symptoms. Pt is alert and oriented x4. NAD.

## 2013-12-01 NOTE — Discharge Instructions (Signed)
Please read and follow all provided instructions.  Your diagnoses today include:  1. Abdominal pain in pregnancy   2. Vomiting complicating pregnancy    Tests performed today include:  Blood counts and electrolytes  Blood tests to check liver and kidney function  Blood tests to check pancreas function  Urine test to look for infection and pregnancy (in women)  Ultrasound - shows pregnancy in the uterus. You are 6 weeks and 2 days along. As we discussed the appearance of the pregnancy puts you at a higher risk of miscarriage. You should follow-up with an OB doctor in the next 2 weeks for a recheck.   Vital signs. See below for your results today.   Medications prescribed:   Zofran (ondansetron) - for nausea and vomiting  Take any prescribed medications only as directed.  Home care instructions:   Follow any educational materials contained in this packet.  Follow-up instructions: Please follow-up with your OB doctor in the next 1-2 weeks for further evaluation of your symptoms. If you do not have a primary care doctor -- see below for referral information.   Return instructions:  SEEK IMMEDIATE MEDICAL ATTENTION IF:  The pain does not go away or becomes severe   A temperature above 101F develops   Repeated vomiting occurs (multiple episodes) that is not controlled with medication  If you develop vaginal bleeding  The pain becomes localized to portions of the abdomen. The right side could possibly be appendicitis. In an adult, the left lower portion of the abdomen could be colitis or diverticulitis.   Blood is being passed in stools or vomit (bright red or black tarry stools)   You develop chest pain, difficulty breathing, dizziness or fainting, or become confused, poorly responsive, or inconsolable (young children)  If you have any other emergent concerns regarding your health  Additional Information: Abdominal (belly) pain can be caused by many things. Your  caregiver performed an examination and possibly ordered blood/urine tests and imaging (CT scan, x-rays, ultrasound). Many cases can be observed and treated at home after initial evaluation in the emergency department. Even though you are being discharged home, abdominal pain can be unpredictable. Therefore, you need a repeated exam if your pain does not resolve, returns, or worsens. Most patients with abdominal pain don't have to be admitted to the hospital or have surgery, but serious problems like appendicitis and gallbladder attacks can start out as nonspecific pain. Many abdominal conditions cannot be diagnosed in one visit, so follow-up evaluations are very important.  Your vital signs today were: BP 116/71   Pulse 65   Temp(Src) 98.6 F (37 C) (Oral)   Resp 18   SpO2 100% If your blood pressure (bp) was elevated above 135/85 this visit, please have this repeated by your doctor within one month. --------------

## 2013-12-01 NOTE — ED Provider Notes (Signed)
Medical screening examination/treatment/procedure(s) were performed by non-physician practitioner and as supervising physician I was immediately available for consultation/collaboration.   EKG Interpretation None       Jasper Riling. Alvino Chapel, MD 12/01/13 2041

## 2013-12-02 LAB — GC/CHLAMYDIA PROBE AMP
CT Probe RNA: NEGATIVE
GC Probe RNA: NEGATIVE

## 2013-12-04 ENCOUNTER — Inpatient Hospital Stay (HOSPITAL_COMMUNITY)
Admission: AD | Admit: 2013-12-04 | Discharge: 2013-12-04 | Disposition: A | Discharge status: Home / Self Care | Payer: Medicaid Other | Source: Ambulatory Visit | Attending: Obstetrics & Gynecology | Admitting: Obstetrics & Gynecology

## 2013-12-04 ENCOUNTER — Encounter (HOSPITAL_COMMUNITY): Payer: Self-pay

## 2013-12-04 DIAGNOSIS — O209 Hemorrhage in early pregnancy, unspecified: Secondary | ICD-10-CM | POA: Insufficient documentation

## 2013-12-04 DIAGNOSIS — R109 Unspecified abdominal pain: Secondary | ICD-10-CM | POA: Insufficient documentation

## 2013-12-04 DIAGNOSIS — O210 Mild hyperemesis gravidarum: Secondary | ICD-10-CM | POA: Insufficient documentation

## 2013-12-04 DIAGNOSIS — N390 Urinary tract infection, site not specified: Secondary | ICD-10-CM

## 2013-12-04 DIAGNOSIS — Z87891 Personal history of nicotine dependence: Secondary | ICD-10-CM | POA: Insufficient documentation

## 2013-12-04 HISTORY — DX: Malignant neoplasm of labium majus: C51.0

## 2013-12-04 LAB — URINALYSIS, ROUTINE W REFLEX MICROSCOPIC
Bilirubin Urine: NEGATIVE
Glucose, UA: NEGATIVE mg/dL
KETONES UR: NEGATIVE mg/dL
Leukocytes, UA: NEGATIVE
NITRITE: POSITIVE — AB
PH: 7 (ref 5.0–8.0)
Protein, ur: NEGATIVE mg/dL
Specific Gravity, Urine: 1.025 (ref 1.005–1.030)
Urobilinogen, UA: 0.2 mg/dL (ref 0.0–1.0)

## 2013-12-04 LAB — URINE MICROSCOPIC-ADD ON

## 2013-12-04 LAB — HCG, QUANTITATIVE, PREGNANCY: hCG, Beta Chain, Quant, S: 33366 m[IU]/mL — ABNORMAL HIGH (ref <5)

## 2013-12-04 MED ORDER — CEPHALEXIN 500 MG PO CAPS: 500.0000 mg | ORAL_CAPSULE | Freq: Four times a day (QID) | ORAL | Status: DC

## 2013-12-04 NOTE — MAU Provider Note (Signed)
History     CSN: [id number]  Arrival date and time: 12/04/13 1302   First Provider Initiated Contact with Patient 12/04/13 1455      Chief Complaint  Patient presents with  . Vaginal Bleeding  . Morning Sickness   HPI Ms. Crystal Jenkins is a 28 y.o. [phone] at [redacted]w[redacted]d who presents to MAU today with spotting since this morning. The patient had evaluation for abdominal pain in pregnancy on 12/01/13 at [facility] which showed quant hCG > 20,000 and and irregular IUGS without YS or FP on [place]. The patient denies pain today. She has N/V daily for which she was given Zofran at [facility]. She states that it doesn't work very well for her. She denies fever today.   OB History   Grav Para Term Preterm Abortions TAB SAB Ect Mult Living   5 2 1 1 1  1   2       Past Medical History  Diagnosis Date  . Cancer     cervical  . Extrauterine pregnancy   . Vertigo   . Adenocarcinoma of Bartholin's gland, stage 1     Past Surgical History  Procedure Laterality Date  . Cesarean section    . Dilation and curettage of uterus    . Cryotherapy      Family History  Problem Relation Age of Onset  . Hypertension Mother   . Stroke Mother   . Heart disease Mother   . Epilepsy Mother   . Heart disease Paternal Aunt   . Hypertension Maternal Grandmother   . Diabetes Maternal Grandmother   . Cancer Maternal Grandmother     History  Substance Use Topics  . Smoking status: Former Smoker    Types: Cigarettes  . Smokeless tobacco: Not on file     Comment: social somoker  . Alcohol Use: Yes     Comment: occasionally    Allergies:  Allergies  Allergen Reactions  . Bactrim [Sulfamethoxazole-Trimethoprim]     Constipation   . Oxycodone Itching    No prescriptions prior to admission    Review of Systems  Constitutional: Positive for malaise/fatigue. Negative for fever.  Gastrointestinal: Positive for nausea and vomiting. Negative for abdominal pain, diarrhea and constipation.  Genitourinary:  Negative for dysuria, urgency and frequency.       + vaginal bleeding Neg - vaginal discharge  Neurological: Positive for dizziness and weakness. Negative for loss of consciousness.   Physical Exam   Blood pressure 101/65, pulse 82, temperature 99 F (37.2 C), temperature source Oral, resp. rate 18, height 5\' 2"  (1.575 m), weight 124 lb 6.4 oz (56.427 kg), last menstrual period 10/30/2013, SpO2 99.00%, unknown if currently breastfeeding.  Physical Exam  Constitutional: She is oriented to person, place, and time. She appears well-developed and well-nourished. No distress.  HENT:  Head: Normocephalic and atraumatic.  Cardiovascular: Normal rate.   Respiratory: Effort normal.  GI: Soft. She exhibits no distension and no mass. There is no tenderness. There is no rebound and no guarding.  Genitourinary: Uterus is not enlarged and not tender. Cervix exhibits no motion tenderness, no discharge and no friability. Right adnexum displays no mass and no tenderness. Left adnexum displays no mass and no tenderness. There is bleeding (scant pink spotting noted) around the vagina. Vaginal discharge (small white discharge) found.  Neurological: She is alert and oriented to person, place, and time.  Skin: Skin is warm and dry. No erythema.  Psychiatric: She has a normal mood and affect.   Results  for orders placed during the hospital encounter of 12/04/13 (from the past 24 hour(s))  URINALYSIS, ROUTINE W REFLEX MICROSCOPIC     Status: Abnormal   Collection Time    12/04/13  1:30 PM      Result Value Ref Range   Color, Urine YELLOW  YELLOW   APPearance HAZY (*) CLEAR   Specific Gravity, Urine 1.025  1.005 - 1.030   pH 7.0  5.0 - 8.0   Glucose, UA NEGATIVE  NEGATIVE mg/dL   Hgb urine dipstick MODERATE (*) NEGATIVE   Bilirubin Urine NEGATIVE  NEGATIVE   Ketones, ur NEGATIVE  NEGATIVE mg/dL   Protein, ur NEGATIVE  NEGATIVE mg/dL   Urobilinogen, UA 0.2  0.0 - 1.0 mg/dL   Nitrite POSITIVE (*) NEGATIVE    Leukocytes, UA NEGATIVE  NEGATIVE  URINE MICROSCOPIC-ADD ON     Status: Abnormal   Collection Time    12/04/13  1:30 PM      Result Value Ref Range   Squamous Epithelial / LPF FEW (*) RARE   WBC, UA 0-2  <3 WBC/hpf   RBC / HPF 0-2  <3 RBC/hpf   Bacteria, UA MANY (*) RARE  HCG, QUANTITATIVE, PREGNANCY     Status: Abnormal   Collection Time    12/04/13  3:08 PM      Result Value Ref Range   hCG, Beta Chain, Quant, S 33366 (*) <5 mIU/mL    MAU Course  Procedures None  MDM UA and quant hCG today Discussed with Dr. Roselie Awkward. Get quant hCG today Discussed results with Dr. Roselie Awkward. Schedule follow-up US in 7-10 days from last [place] Most likely not a normal pregnancy. Cannot exclude ectopic. Discussed the possibilities with patient of failed pregnancy. Patient voiced understanding.  [place] can call [phone] with any abnormal results at time of follow-up Assessment and Plan  A: IUGS at [redacted]w[redacted]d without YS or FP Vaginal bleeding in pregnancy, antepartum  P: Discharge home Bleeding/ectopic precautions discussed Patient has follow-up US ordered for 12/09/13. Radiology will call her with an appointment Patient may return to MAU as needed or if her condition were to change or worsen  Farris Has, PA-C  12/04/2013, 8:17 PM

## 2013-12-04 NOTE — MAU Note (Signed)
Patient was seen at [facility] ED on 4-19. States she started bleeding this morning but is only spotting now. Denies pain. Has nausea and vomiting for about one week.

## 2013-12-04 NOTE — MAU Note (Signed)
Pt states that she was diagnosed with Bartholin cancer about 6 months ago and she has not followed-up;

## 2013-12-04 NOTE — Discharge Instructions (Signed)
° °Pelvic Rest °Pelvic rest is sometimes recommended for women when:  °· The placenta is partially or completely covering the opening of the cervix (placenta previa). °· There is bleeding between the uterine wall and the amniotic sac in the first trimester (subchorionic hemorrhage). °· The cervix begins to open without labor starting (incompetent cervix, cervical insufficiency). °· The labor is too early (preterm labor). °HOME CARE INSTRUCTIONS °· Do not have sexual intercourse, stimulation, or an orgasm. °· Do not use tampons, douche, or put anything in the vagina. °· Do not lift anything over 10 pounds (4.5 kg). °· Avoid strenuous activity or straining your pelvic muscles. °SEEK MEDICAL CARE IF:  °· You have any vaginal bleeding during pregnancy. Treat this as a potential emergency. °· You have cramping pain felt low in the stomach (stronger than menstrual cramps). °· You notice vaginal discharge (watery, mucus, or bloody). °· You have a low, dull backache. °· There are regular contractions or uterine tightening. °SEEK IMMEDIATE MEDICAL CARE IF: °You have vaginal bleeding and have placenta previa.  °Document Released: 11/26/2010 Document Revised: 10/24/2011 Document Reviewed: 11/26/2010 °ExitCare® Patient Information ©2014 [employer]. ° °Vaginal Bleeding During Pregnancy, First Trimester °A small amount of bleeding (spotting) from the vagina is relatively common in early pregnancy. It usually stops on its own. Various things may cause bleeding or spotting in early pregnancy. Some bleeding may be related to the pregnancy, and some may not. In most cases, the bleeding is normal and is not a problem. However, bleeding can also be a sign of something serious. Be sure to tell your health care provider about any vaginal bleeding right away. °Some possible causes of vaginal bleeding during the first trimester include: °· Infection or inflammation of the cervix. °· Growths (polyps) on the cervix. °· Miscarriage or  threatened miscarriage. °· Pregnancy tissue has developed outside of the uterus and in a fallopian tube (tubal pregnancy). °· Tiny cysts have developed in the uterus instead of pregnancy tissue (molar pregnancy). °HOME CARE INSTRUCTIONS  °Watch your condition for any changes. The following actions may help to lessen any discomfort you are feeling: °· Follow your health care provider's instructions for limiting your activity. If your health care provider orders bed rest, you may need to stay in bed and only get up to use the bathroom. However, your health care provider may allow you to continue light activity. °· If needed, make plans for someone to help with your regular activities and responsibilities while you are on bed rest. °· Keep track of the number of pads you use each day, how often you change pads, and how soaked (saturated) they are. Write this down. °· Do not use tampons. Do not douche. °· Do not have sexual intercourse or orgasms until approved by your health care provider. °· If you pass any tissue from your vagina, save the tissue so you can show it to your health care provider. °· Only take over-the-counter or prescription medicines as directed by your health care provider. °· Do not take aspirin because it can make you bleed. °· Keep all follow-up appointments as directed by your health care provider. °SEEK MEDICAL CARE IF: °· You have any vaginal bleeding during any part of your pregnancy. °· You have cramps or labor pains. °SEEK IMMEDIATE MEDICAL CARE IF:  °· You have severe cramps in your back or belly (abdomen). °· You have a fever, not controlled by medicine. °· You pass large clots or tissue from your vagina. °· Your bleeding   increases. °· You feel lightheaded or weak, or you have fainting episodes. °· You have chills. °· You are leaking fluid or have a gush of fluid from your vagina. °· You pass out while having a bowel movement. °MAKE SURE YOU: °· Understand these instructions. °· Will watch  your condition. °· Will get help right away if you are not doing well or get worse. °Document Released: 05/11/2005 Document Revised: 05/22/2013 Document Reviewed: 04/08/2013 °ExitCare® Patient Information ©2014 [employer]. ° °

## 2013-12-11 ENCOUNTER — Ambulatory Visit (HOSPITAL_COMMUNITY)
Admission: RE | Admit: 2013-12-11 | Discharge: 2013-12-11 | Disposition: A | Discharge status: Home / Self Care | Payer: Medicaid Other | Source: Ambulatory Visit | Attending: Medical | Admitting: Medical

## 2013-12-11 DIAGNOSIS — O209 Hemorrhage in early pregnancy, unspecified: Secondary | ICD-10-CM

## 2013-12-11 DIAGNOSIS — O3680X0 Pregnancy with inconclusive fetal viability, not applicable or unspecified: Secondary | ICD-10-CM | POA: Insufficient documentation

## 2013-12-14 ENCOUNTER — Telehealth: Payer: Self-pay | Admitting: Medical

## 2013-12-14 NOTE — Telephone Encounter (Signed)
LM for patient to return call to MAU NP office. Patient had US showing IUP. Needs to be informed and discuss plan for care.   Farris Has, PA-C 12/14/2013 8:08 PM

## 2013-12-15 ENCOUNTER — Telehealth: Payer: Self-pay | Admitting: Advanced Practice Midwife

## 2013-12-15 NOTE — Telephone Encounter (Signed)
Returned call from Rozelle Logan, [place]. Reviewed normal [place]. Change in EDD to 08/05/2014. Pt filled Keflex Rx today, but hasn't started yet. Encouraged to take his regular. Has new OB visit scheduled for tomorrow.

## 2013-12-16 ENCOUNTER — Other Ambulatory Visit (HOSPITAL_COMMUNITY)
Admission: RE | Admit: 2013-12-16 | Discharge: 2013-12-16 | Disposition: A | Discharge status: Home / Self Care | Payer: Medicaid Other | Source: Ambulatory Visit | Attending: Obstetrics & Gynecology | Admitting: Obstetrics & Gynecology

## 2013-12-16 ENCOUNTER — Ambulatory Visit (INDEPENDENT_AMBULATORY_CARE_PROVIDER_SITE_OTHER): Payer: Medicaid Other | Admitting: Obstetrics & Gynecology

## 2013-12-16 ENCOUNTER — Encounter: Payer: Self-pay | Admitting: Family Medicine

## 2013-12-16 VITALS — BP 108/74 | HR 69 | Temp 97.6°F | Wt 124.8 lb

## 2013-12-16 DIAGNOSIS — Z3490 Encounter for supervision of normal pregnancy, unspecified, unspecified trimester: Secondary | ICD-10-CM

## 2013-12-16 DIAGNOSIS — Z3480 Encounter for supervision of other normal pregnancy, unspecified trimester: Secondary | ICD-10-CM

## 2013-12-16 DIAGNOSIS — O219 Vomiting of pregnancy, unspecified: Secondary | ICD-10-CM

## 2013-12-16 DIAGNOSIS — O34219 Maternal care for unspecified type scar from previous cesarean delivery: Secondary | ICD-10-CM

## 2013-12-16 DIAGNOSIS — Z01419 Encounter for gynecological examination (general) (routine) without abnormal findings: Secondary | ICD-10-CM | POA: Insufficient documentation

## 2013-12-16 LAB — POCT URINALYSIS DIP (DEVICE)
BILIRUBIN URINE: NEGATIVE
GLUCOSE, UA: NEGATIVE mg/dL
Hgb urine dipstick: NEGATIVE
Ketones, ur: NEGATIVE mg/dL
NITRITE: POSITIVE — AB
Protein, ur: NEGATIVE mg/dL
Specific Gravity, Urine: 1.025 (ref 1.005–1.030)
Urobilinogen, UA: 0.2 mg/dL (ref 0.0–1.0)
pH: 7 (ref 5.0–8.0)

## 2013-12-16 MED ORDER — ONDANSETRON 4 MG PO TBDP: 4.0000 mg | ORAL_TABLET | Freq: Three times a day (TID) | ORAL | Status: DC | PRN

## 2013-12-16 MED ORDER — PRENATAL VITAMINS 0.8 MG PO TABS: 1.0000 | ORAL_TABLET | Freq: Every day | ORAL | Status: DC

## 2013-12-16 NOTE — Progress Notes (Signed)
Weight gain 25-35lbs New ob packet given Flu vaccine declined

## 2013-12-16 NOTE — Progress Notes (Signed)
First Screen scheduled with MFM on 01/29/14 at 9 am.

## 2013-12-16 NOTE — Progress Notes (Signed)
Nutrition note: 1st visit consult Pt has gained 2.8# @ [redacted]w[redacted]d, which is wnl. Pt reports eating 1 meal (dinner) & 4-5 snacks/d. Pt reports having N/V (in the morning) but zofran helps and has some heartburn. NKFA. Pt is not taking a PNV yet. Pt received verbal & written education on general nutrition during pregnancy. Discussed tips to decrease N/V and heartburn. Encouraged PNV or 2 chewable multivitamins. Discussed wt gain goals of 25-35# or 1#/wk in 2nd & 3rd trimester. Pt agrees to start taking a PNV. Pt does not have [place] but plans to apply. Pt is unsure about BF. F/u as needed Crystal Faster, MS, RD, LDN, [facility]

## 2013-12-16 NOTE — Progress Notes (Signed)
Subjective:    Crystal Jenkins is a 28 y.o. [id number] at [redacted]w[redacted]d, dated by LMP consistent with a 6 week CRL scan, being seen today for her first obstetrical visit.  Her obstetrical history is significant for nausea and vomiting for which she takes medications.  Also had two previous cesarean sections. Patient does intend to try to breast feed. Pregnancy history fully reviewed.  Patient reports nausea and vomiting, does not need refill of medications. Currently being treated for UTI.  Filed Vitals:   12/16/13 1031  BP: 108/74  Pulse: 69  Temp: 97.6 F (36.4 C)  Weight: 124 lb 12.8 oz (56.609 kg)    HISTORY: OB History  Gravida Para Term Preterm AB SAB TAB Ectopic Multiple Living  5 2 2  0 2 1  1  2     # Outcome Date GA Lbr Len/2nd Weight Sex Delivery Anes PTL Lv  5 CUR           4 TRM 08/28/07 [redacted]w[redacted]d  7 lb 6 oz (3.345 kg) M LTCS Spinal  Y  3 TRM 06/10/06 [redacted]w[redacted]d  6 lb 6 oz (2.892 kg) F LTCS EPI  Y     Comments: Decel  2 ECT 2005             Comments: had surgery "half of right tube"  1 SAB 2004             Past Medical History  Diagnosis Date  . Cancer     cervical  . Extrauterine pregnancy   . Vertigo   . Adenocarcinoma of Bartholin's gland, stage 1   . Vaginal Pap smear, abnormal     2005; had cryo   Past Surgical History  Procedure Laterality Date  . Cesarean section    . Dilation and curettage of uterus    . Cryotherapy     Family History  Problem Relation Age of Onset  . Hypertension Mother   . Stroke Mother   . Heart disease Mother   . Epilepsy Mother   . Heart disease Paternal Aunt   . Hypertension Maternal Grandmother   . Diabetes Maternal Grandmother   . Cancer Maternal Grandmother      Exam    Uterus:     Pelvic Exam:    Perineum: No Hemorrhoids, Normal Perineum   Vulva: normal   Vagina:  normal mucosa   Cervix: multiparous appearance and no bleeding following Pap   Adnexa: normal adnexa and no mass, fullness, tenderness   Bony Pelvis: average   System: Breast:  normal appearance, no masses or tenderness   Skin: normal coloration and turgor, no rashes   Neurologic: normal   Extremities: normal strength, tone, and muscle mass   HEENT PERRLA and extra ocular movement intact   Mouth/Teeth mucous membranes moist, pharynx normal without lesions and dental hygiene good   Neck supple and no masses   Cardiovascular: regular rate and rhythm   Respiratory:  appears well, vitals normal, no respiratory distress, acyanotic, normal RR, chest clear, no wheezing, crepitations, rhonchi, normal symmetric air entry   Abdomen: soft, non-tender; bowel sounds normal; no masses,  no organomegaly   Urinary: urethral meatus normal      Assessment:    Pregnancy: [id number] Patient Active Problem List   Diagnosis Date Noted  . Previous cesarean delivery x 2 affecting pregnancy, antepartum 12/16/2013  . Migraine with aura 09/20/2013  . Nausea and vomiting in pregnancy 03/09/2013  . Hyperemesis gravidarum 03/09/2013  . Dehydration, mild  03/09/2013  . Supervision of normal pregnancy 03/09/2013        Plan:   Initial labs drawn. Continue prenatal vitamins. Problem list reviewed and updated. Genetic Screening discussed First Screen: ordered. Ultrasound discussed; fetal survey: to be ordered. Follow up in 4 weeks, routine obstetric precautions reviewed.   Osborne Oman, MD 12/16/2013

## 2013-12-16 NOTE — Patient Instructions (Signed)
Pregnancy - First Trimester During sexual intercourse, millions of sperm go into the vagina. Only 1 sperm will penetrate and fertilize the female egg while it is in the Fallopian tube. One week later, the fertilized egg implants into the wall of the uterus. An embryo begins to develop into a baby. At 6 to 8 weeks, the eyes and face are formed and the heartbeat can be seen on ultrasound. At the end of 12 weeks (first trimester), all the baby's organs are formed. Now that you are pregnant, you will want to do everything you can to have a healthy baby. Two of the most important things are to get good prenatal care and follow your caregiver's instructions. Prenatal care is all the medical care you receive before the baby's birth. It is given to prevent, find, and treat problems during the pregnancy and childbirth. PRENATAL EXAMS  During prenatal visits, your weight, blood pressure, and urine are checked. This is done to make sure you are healthy and progressing normally during the pregnancy.  A pregnant woman should gain 25 to 35 pounds during the pregnancy. However, if you are overweight or underweight, your caregiver will advise you regarding your weight.  Your caregiver will ask and answer questions for you.  Blood work, cervical cultures, other necessary tests, and a Pap test are done during your prenatal exams. These tests are done to check on your health and the probable health of your baby. Tests are strongly recommended and done for HIV with your permission. This is the virus that causes AIDS. These tests are done because medicines can be given to help prevent your baby from being born with this infection should you have been infected without knowing it. Blood work is also used to find out your blood type, previous infections, and follow your blood levels (hemoglobin).  Low hemoglobin (anemia) is common during pregnancy. Iron and vitamins are given to help prevent this. Later in the pregnancy,  blood tests for diabetes will be done along with any other tests if any problems develop.  You may need other tests to make sure you and the baby are doing well. CHANGES DURING THE FIRST TRIMESTER  Your body goes through many changes during pregnancy. They vary from person to person. Talk to your caregiver about changes you notice and are concerned about. Changes can include:  Your menstrual period stops.  The egg and sperm carry the genes that determine what you look like. Genes from you and your partner are forming a baby. The female genes determine whether the baby is a boy or a girl.  Your body increases in girth and you may feel bloated.  Feeling sick to your stomach (nauseous) and throwing up (vomiting). If the vomiting is uncontrollable, call your caregiver.  Your breasts will begin to enlarge and become tender.  Your nipples may stick out more and become darker.  The need to urinate more. Painful urination may mean you have a bladder infection.  Tiring easily.  Loss of appetite.  Cravings for certain kinds of food.  At first, you may gain or lose a couple of pounds.  You may have changes in your emotions from day to day (excited to be pregnant or concerned something may go wrong with the pregnancy and baby).  You may have more vivid and strange dreams. HOME CARE INSTRUCTIONS   It is very important to avoid all smoking, alcohol and non-prescribed drugs during your pregnancy. These affect the formation and growth of the baby.   Avoid chemicals while pregnant to ensure the delivery of a healthy infant.  Start your prenatal visits by the 12th week of pregnancy. They are usually scheduled monthly at first, then more often in the last 2 months before delivery. Keep your caregiver's appointments. Follow your caregiver's instructions regarding medicine use, blood and lab tests, exercise, and diet.  During pregnancy, you are providing food for you and your baby. Eat regular,  well-balanced meals. Choose foods such as meat, fish, milk and other low fat dairy products, vegetables, fruits, and whole-grain breads and cereals. Your caregiver will tell you of the ideal weight gain.  You can help morning sickness by keeping soda crackers at the bedside. Eat a couple before arising in the morning. You may want to use the crackers without salt on them.  Eating 4 to 5 small meals rather than 3 large meals a day also may help the nausea and vomiting.  Drinking liquids between meals instead of during meals also seems to help nausea and vomiting.  A physical sexual relationship may be continued throughout pregnancy if there are no other problems. Problems may be early (premature) leaking of amniotic fluid from the membranes, vaginal bleeding, or belly (abdominal) pain.  Exercise regularly if there are no restrictions. Check with your caregiver or physical therapist if you are unsure of the safety of some of your exercises. Greater weight gain will occur in the last 2 trimesters of pregnancy. Exercising will help:  Control your weight.  Keep you in shape.  Prepare you for labor and delivery.  Help you lose your pregnancy weight after you deliver your baby.  Wear a good support or jogging bra for breast tenderness during pregnancy. This may help if worn during sleep too.  Ask when prenatal classes are available. Begin classes when they are offered.  Do not use hot tubs, steam rooms, or saunas.  Wear your seat belt when driving. This protects you and your baby if you are in an accident.  Avoid raw meat, uncooked cheese, cat litter boxes, and soil used by cats throughout the pregnancy. These carry germs that can cause birth defects in the baby.  The first trimester is a good time to visit your dentist for your dental health. Getting your teeth cleaned is okay. Use a softer toothbrush and brush gently during pregnancy.  Ask for help if you have financial, counseling, or  nutritional needs during pregnancy. Your caregiver will be able to offer counseling for these needs as well as refer you for other special needs.  Do not take any medicines or herbs unless told by your caregiver.  Inform your caregiver if there is any mental or physical domestic violence.  Make a list of emergency phone numbers of family, friends, hospital, and police and fire departments.  Write down your questions. Take them to your prenatal visit.  Do not douche.  Do not cross your legs.  If you have to stand for long periods of time, rotate you feet or take small steps in a circle.  You may have more vaginal secretions that may require a sanitary pad. Do not use tampons or scented sanitary pads. MEDICINES AND DRUG USE IN PREGNANCY  Take prenatal vitamins as directed. The vitamin should contain 1 milligram of folic acid. Keep all vitamins out of reach of children. Only a couple vitamins or tablets containing iron may be fatal to a baby or young child when ingested.  Avoid use of all medicines, including herbs, over-the-counter medicines, not   prescribed or suggested by your caregiver. Only take over-the-counter or prescription medicines for pain, discomfort, or fever as directed by your caregiver. Do not use aspirin, ibuprofen, or naproxen unless directed by your caregiver.  Let your caregiver also know about herbs you may be using.  Alcohol is related to a number of birth defects. This includes fetal alcohol syndrome. All alcohol, in any form, should be avoided completely. Smoking will cause low birth rate and premature babies.  Street or illegal drugs are very harmful to the baby. They are absolutely forbidden. A baby born to an addicted mother will be addicted at birth. The baby will go through the same withdrawal an adult does.  Let your caregiver know about any medicines that you have to take and for what reason you take them. SEEK MEDICAL CARE IF:  You have any concerns or  worries during your pregnancy. It is better to call with your questions if you feel they cannot wait, rather than worry about them. SEEK IMMEDIATE MEDICAL CARE IF:   An unexplained oral temperature above 102 F (38.9 C) develops, or as your caregiver suggests.  You have leaking of fluid from the vagina (birth canal). If leaking membranes are suspected, take your temperature and inform your caregiver of this when you call.  There is vaginal spotting or bleeding. Notify your caregiver of the amount and how many pads are used.  You develop a bad smelling vaginal discharge with a change in the color.  You continue to feel sick to your stomach (nauseated) and have no relief from remedies suggested. You vomit blood or coffee ground-like materials.  You lose more than 2 pounds of weight in 1 week.  You gain more than 2 pounds of weight in 1 week and you notice swelling of your face, hands, feet, or legs.  You gain 5 pounds or more in 1 week (even if you do not have swelling of your hands, face, legs, or feet).  You get exposed to German measles and have never had them.  You are exposed to fifth disease or chickenpox.  You develop belly (abdominal) pain. Round ligament discomfort is a common non-cancerous (benign) cause of abdominal pain in pregnancy. Your caregiver still must evaluate this.  You develop headache, fever, diarrhea, pain with urination, or shortness of breath.  You fall or are in a car accident or have any kind of trauma.  There is mental or physical violence in your home. Document Released: 07/26/2001 Document Revised: 04/25/2012 Document Reviewed: 01/27/2009 [employer] Patient Information 2014 [employer].  Breastfeeding Deciding to breastfeed is one of the best choices you can make for you and your baby. A change in hormones during pregnancy causes your breast tissue to grow and increases the number and size of your milk ducts. These hormones also allow proteins,  sugars, and fats from your blood supply to make breast milk in your milk-producing glands. Hormones prevent breast milk from being released before your baby is born as well as prompt milk flow after birth. Once breastfeeding has begun, thoughts of your baby, as well as his or her sucking or crying, can stimulate the release of milk from your milk-producing glands.  BENEFITS OF BREASTFEEDING For Your Baby  Your first milk (colostrum) helps your baby's digestive system function better.   There are antibodies in your milk that help your baby fight off infections.   Your baby has a lower incidence of asthma, allergies, and sudden infant death syndrome.   The nutrients   in breast milk are better for your baby than infant formulas and are designed uniquely for your baby's needs.   Breast milk improves your baby's brain development.   Your baby is less likely to develop other conditions, such as childhood obesity, asthma, or type 2 diabetes mellitus.  For You   Breastfeeding helps to create a very special bond between you and your baby.   Breastfeeding is convenient. Breast milk is always available at the correct temperature and costs nothing.   Breastfeeding helps to burn calories and helps you lose the weight gained during pregnancy.   Breastfeeding makes your uterus contract to its prepregnancy size faster and slows bleeding (lochia) after you give birth.   Breastfeeding helps to lower your risk of developing type 2 diabetes mellitus, osteoporosis, and breast or ovarian cancer later in life. SIGNS THAT YOUR BABY IS HUNGRY Early Signs of Hunger  Increased alertness or activity.  Stretching.  Movement of the head from side to side.  Movement of the head and opening of the mouth when the corner of the mouth or cheek is stroked (rooting).  Increased sucking sounds, smacking lips, cooing, sighing, or squeaking.  Hand-to-mouth movements.  Increased sucking of fingers or  hands. Late Signs of Hunger  Fussing.  Intermittent crying. Extreme Signs of Hunger Signs of extreme hunger will require calming and consoling before your baby will be able to breastfeed successfully. Do not wait for the following signs of extreme hunger to occur before you initiate breastfeeding:   Restlessness.  A loud, strong cry.   Screaming. BREASTFEEDING BASICS Breastfeeding Initiation  Find a comfortable place to sit or lie down, with your neck and back well supported.  Place a pillow or rolled up blanket under your baby to bring him or her to the level of your breast (if you are seated). Nursing pillows are specially designed to help support your arms and your baby while you breastfeed.  Make sure that your baby's abdomen is facing your abdomen.   Gently massage your breast. With your fingertips, massage from your chest wall toward your nipple in a circular motion. This encourages milk flow. You may need to continue this action during the feeding if your milk flows slowly.  Support your breast with 4 fingers underneath and your thumb above your nipple. Make sure your fingers are well away from your nipple and your baby's mouth.   Stroke your baby's lips gently with your finger or nipple.   When your baby's mouth is open wide enough, quickly bring your baby to your breast, placing your entire nipple and as much of the colored area around your nipple (areola) as possible into your baby's mouth.   More areola should be visible above your baby's upper lip than below the lower lip.   Your baby's tongue should be between his or her lower gum and your breast.   Ensure that your baby's mouth is correctly positioned around your nipple (latched). Your baby's lips should create a seal on your breast and be turned out (everted).  It is common for your baby to suck about 2 3 minutes in order to start the flow of breast milk. Latching Teaching your baby how to latch on to your  breast properly is very important. An improper latch can cause nipple pain and decreased milk supply for you and poor weight gain in your baby. Also, if your baby is not latched onto your nipple properly, he or she may swallow some air during   feeding. This can make your baby fussy. Burping your baby when you switch breasts during the feeding can help to get rid of the air. However, teaching your baby to latch on properly is still the best way to prevent fussiness from swallowing air while breastfeeding. Signs that your baby has successfully latched on to your nipple:    Silent tugging or silent sucking, without causing you pain.   Swallowing heard between every 3 4 sucks.    Muscle movement above and in front of his or her ears while sucking.  Signs that your baby has not successfully latched on to nipple:   Sucking sounds or smacking sounds from your baby while breastfeeding.  Nipple pain. If you think your baby has not latched on correctly, slip your finger into the corner of your baby's mouth to break the suction and place it between your baby's gums. Attempt breastfeeding initiation again. Signs of Successful Breastfeeding Signs from your baby:   A gradual decrease in the number of sucks or complete cessation of sucking.   Falling asleep.   Relaxation of his or her body.   Retention of a small amount of milk in his or her mouth.   Letting go of your breast by himself or herself. Signs from you:  Breasts that have increased in firmness, weight, and size 1 3 hours after feeding.   Breasts that are softer immediately after breastfeeding.  Increased milk volume, as well as a change in milk consistency and color by the 5th day of breastfeeding.   Nipples that are not sore, cracked, or bleeding. Signs That Your Baby is Getting Enough Milk  Wetting at least 3 diapers in a 24-hour period. The urine should be clear and pale yellow by age 5 days.  At least 3 stools in a  24-hour period by age 5 days. The stool should be soft and yellow.  At least 3 stools in a 24-hour period by age 7 days. The stool should be seedy and yellow.  No loss of weight greater than 10% of birth weight during the first 3 days of age.  Average weight gain of 4 7 ounces (120 210 mL) per week after age 4 days.  Consistent daily weight gain by age 5 days, without weight loss after the age of 2 weeks. After a feeding, your baby may spit up a small amount. This is common. BREASTFEEDING FREQUENCY AND DURATION Frequent feeding will help you make more milk and can prevent sore nipples and breast engorgement. Breastfeed when you feel the need to reduce the fullness of your breasts or when your baby shows signs of hunger. This is called "breastfeeding on demand." Avoid introducing a pacifier to your baby while you are working to establish breastfeeding (the first 4 6 weeks after your baby is born). After this time you may choose to use a pacifier. Research has shown that pacifier use during the first year of a baby's life decreases the risk of sudden infant death syndrome (SIDS). Allow your baby to feed on each breast as long as he or she wants. Breastfeed until your baby is finished feeding. When your baby unlatches or falls asleep while feeding from the first breast, offer the second breast. Because newborns are often sleepy in the first few weeks of life, you may need to awaken your baby to get him or her to feed. Breastfeeding times will vary from baby to baby. However, the following rules can serve as a guide to help you   ensure that your baby is properly fed:  Newborns (babies 4 weeks of age or younger) may breastfeed every 1 3 hours.  Newborns should not go longer than 3 hours during the day or 5 hours during the night without breastfeeding.  You should breastfeed your baby a minimum of 8 times in a 24-hour period until you begin to introduce solid foods to your baby at around 6 months of  age. BREAST MILK PUMPING Pumping and storing breast milk allows you to ensure that your baby is exclusively fed your breast milk, even at times when you are unable to breastfeed. This is especially important if you are going back to work while you are still breastfeeding or when you are not able to be present during feedings. Your lactation consultant can give you guidelines on how long it is safe to store breast milk.  A breast pump is a machine that allows you to pump milk from your breast into a sterile bottle. The pumped breast milk can then be stored in a refrigerator or freezer. Some breast pumps are operated by hand, while others use electricity. Ask your lactation consultant which type will work best for you. Breast pumps can be purchased, but some hospitals and breastfeeding support groups lease breast pumps on a monthly basis. A lactation consultant can teach you how to hand express breast milk, if you prefer not to use a pump.  CARING FOR YOUR BREASTS WHILE YOU BREASTFEED Nipples can become dry, cracked, and sore while breastfeeding. The following recommendations can help keep your breasts moisturized and healthy:  Avoid using soap on your nipples.   Wear a supportive bra. Although not required, special nursing bras and tank tops are designed to allow access to your breasts for breastfeeding without taking off your entire bra or top. Avoid wearing underwire style bras or extremely tight bras.  Air dry your nipples for 3 4minutes after each feeding.   Use only cotton bra pads to absorb leaked breast milk. Leaking of breast milk between feedings is normal.   Use lanolin on your nipples after breastfeeding. Lanolin helps to maintain your skin's normal moisture barrier. If you use pure lanolin you do not need to wash it off before feeding your baby again. Pure lanolin is not toxic to your baby. You may also hand express a few drops of breast milk and gently massage that milk into your  nipples and allow the milk to air dry. In the first few weeks after giving birth, some women experience extremely full breasts (engorgement). Engorgement can make your breasts feel heavy, warm, and tender to the touch. Engorgement peaks within 3 5 days after you give birth. The following recommendations can help ease engorgement:  Completely empty your breasts while breastfeeding or pumping. You may want to start by applying warm, moist heat (in the shower or with warm water-soaked hand towels) just before feeding or pumping. This increases circulation and helps the milk flow. If your baby does not completely empty your breasts while breastfeeding, pump any extra milk after he or she is finished.  Wear a snug bra (nursing or regular) or tank top for 1 2 days to signal your body to slightly decrease milk production.  Apply ice packs to your breasts, unless this is too uncomfortable for you.  Make sure that your baby is latched on and positioned properly while breastfeeding. If engorgement persists after 48 hours of following these recommendations, contact your health care provider or a lactation consultant. OVERALL   HEALTH CARE RECOMMENDATIONS WHILE BREASTFEEDING  Eat healthy foods. Alternate between meals and snacks, eating 3 of each per day. Because what you eat affects your breast milk, some of the foods may make your baby more irritable than usual. Avoid eating these foods if you are sure that they are negatively affecting your baby.  Drink milk, fruit juice, and water to satisfy your thirst (about 10 glasses a day).   Rest often, relax, and continue to take your prenatal vitamins to prevent fatigue, stress, and anemia.  Continue breast self-awareness checks.  Avoid chewing and smoking tobacco.  Avoid alcohol and drug use. Some medicines that may be harmful to your baby can pass through breast milk. It is important to ask your health care provider before taking any medicine, including all  over-the-counter and prescription medicine as well as vitamin and herbal supplements. It is possible to become pregnant while breastfeeding. If birth control is desired, ask your health care provider about options that will be safe for your baby. SEEK MEDICAL CARE IF:   You feel like you want to stop breastfeeding or have become frustrated with breastfeeding.  You have painful breasts or nipples.  Your nipples are cracked or bleeding.  Your breasts are red, tender, or warm.  You have a swollen area on either breast.  You have a fever or chills.  You have nausea or vomiting.  You have drainage other than breast milk from your nipples.  Your breasts do not become full before feedings by the 5th day after you give birth.  You feel sad and depressed.  Your baby is too sleepy to eat well.  Your baby is having trouble sleeping.   Your baby is wetting less than 3 diapers in a 24-hour period.  Your baby has less than 3 stools in a 24-hour period.  Your baby's skin or the white part of his or her eyes becomes yellow.   Your baby is not gaining weight by 5 days of age. SEEK IMMEDIATE MEDICAL CARE IF:   Your baby is overly tired (lethargic) and does not want to wake up and feed.  Your baby develops an unexplained fever. Document Released: 08/01/2005 Document Revised: 04/03/2013 Document Reviewed: 01/23/2013 [employer] Patient Information 2014 [employer].  

## 2013-12-16 NOTE — Addendum Note (Signed)
Addended by: Donnamae Jude on: 12/16/2013 12:08 PM   Modules accepted: Orders

## 2013-12-16 NOTE — Progress Notes (Deleted)
Nutrition note: 1st visit consult Pt has gained 2.8# @ [redacted]w[redacted]d

## 2013-12-16 NOTE — Addendum Note (Signed)
Addended by: Shelly Coss on: 12/16/2013 01:14 PM   Modules accepted: Orders

## 2013-12-17 LAB — HIV ANTIBODY (ROUTINE TESTING W REFLEX): HIV 1&2 Ab, 4th Generation: NONREACTIVE

## 2013-12-18 LAB — HEMOGLOBINOPATHY EVALUATION
HGB S QUANTITAION: 0 %
Hemoglobin Other: 0 %
Hgb A2 Quant: 2.5 % (ref 2.2–3.2)
Hgb A: 97.5 % (ref 96.8–97.8)
Hgb F Quant: 0 % (ref 0.0–2.0)

## 2013-12-19 LAB — CULTURE, OB URINE

## 2013-12-21 LAB — CANNABANOIDS (GC/LC/MS), URINE: THC-COOH UR CONFIRM: 1099 ng/mL — AB (ref <5)

## 2013-12-23 ENCOUNTER — Other Ambulatory Visit: Payer: Self-pay | Admitting: Obstetrics & Gynecology

## 2013-12-23 DIAGNOSIS — Z3682 Encounter for antenatal screening for nuchal translucency: Secondary | ICD-10-CM

## 2013-12-24 LAB — PRESCRIPTION MONITORING PROFILE (19 PANEL)
Amphetamine/Meth: NEGATIVE ng/mL
BARBITURATE SCREEN, URINE: NEGATIVE ng/mL
BENZODIAZEPINE SCREEN, URINE: NEGATIVE ng/mL
BUPRENORPHINE, URINE: NEGATIVE ng/mL
COCAINE METABOLITES: NEGATIVE ng/mL
Carisoprodol, Urine: NEGATIVE ng/mL
Creatinine, Urine: 162.91 mg/dL (ref >20.0)
ECSTASY: NEGATIVE ng/mL
FENTANYL URINE: NEGATIVE ng/mL
MEPERIDINE UR: NEGATIVE ng/mL
METHAQUALONE SCREEN (URINE): NEGATIVE ng/mL
Methadone Screen, Urine: NEGATIVE ng/mL
Nitrites, Initial: NEGATIVE ug/mL
OPIATE SCREEN, URINE: NEGATIVE ng/mL
Oxycodone Screen, Ur: NEGATIVE ng/mL
PHENCYCLIDINE, UR: NEGATIVE ng/mL
Propoxyphene: NEGATIVE ng/mL
Tapentadol, urine: NEGATIVE ng/mL
Tramadol Scrn, Ur: NEGATIVE ng/mL
ZOLPIDEM, URINE: NEGATIVE ng/mL
pH, Initial: 7.4 pH (ref 4.5–8.9)

## 2013-12-28 ENCOUNTER — Encounter: Payer: Self-pay | Admitting: *Deleted

## 2013-12-29 ENCOUNTER — Encounter (HOSPITAL_COMMUNITY): Payer: Self-pay | Admitting: Emergency Medicine

## 2013-12-29 ENCOUNTER — Inpatient Hospital Stay (EMERGENCY_DEPARTMENT_HOSPITAL)
Admission: AD | Admit: 2013-12-29 | Discharge: 2013-12-29 | Disposition: A | Discharge status: Home / Self Care | Payer: Medicaid Other | Source: Ambulatory Visit | Attending: Obstetrics and Gynecology | Admitting: Obstetrics and Gynecology

## 2013-12-29 ENCOUNTER — Encounter (HOSPITAL_COMMUNITY): Payer: Self-pay | Admitting: *Deleted

## 2013-12-29 ENCOUNTER — Emergency Department (HOSPITAL_COMMUNITY)
Admission: EM | Admit: 2013-12-29 | Discharge: 2013-12-29 | Disposition: A | Discharge status: Home / Self Care | Payer: Medicaid Other | Attending: Emergency Medicine | Admitting: Emergency Medicine

## 2013-12-29 DIAGNOSIS — O9989 Other specified diseases and conditions complicating pregnancy, childbirth and the puerperium: Secondary | ICD-10-CM | POA: Insufficient documentation

## 2013-12-29 DIAGNOSIS — O26899 Other specified pregnancy related conditions, unspecified trimester: Secondary | ICD-10-CM

## 2013-12-29 DIAGNOSIS — Z8542 Personal history of malignant neoplasm of other parts of uterus: Secondary | ICD-10-CM | POA: Insufficient documentation

## 2013-12-29 DIAGNOSIS — R109 Unspecified abdominal pain: Secondary | ICD-10-CM

## 2013-12-29 DIAGNOSIS — Z87891 Personal history of nicotine dependence: Secondary | ICD-10-CM | POA: Insufficient documentation

## 2013-12-29 DIAGNOSIS — Z79899 Other long term (current) drug therapy: Secondary | ICD-10-CM | POA: Insufficient documentation

## 2013-12-29 HISTORY — DX: Headache: R51

## 2013-12-29 LAB — CBC WITH DIFFERENTIAL/PLATELET
BASOS PCT: 0 % (ref 0–1)
Basophils Absolute: 0 10*3/uL (ref 0.0–0.1)
EOS ABS: 0.1 10*3/uL (ref 0.0–0.7)
Eosinophils Relative: 1 % (ref 0–5)
HCT: 37.4 % (ref 36.0–46.0)
HEMOGLOBIN: 12.8 g/dL (ref 12.0–15.0)
LYMPHS ABS: 2.1 10*3/uL (ref 0.7–4.0)
Lymphocytes Relative: 34 % (ref 12–46)
MCH: 31.1 pg (ref 26.0–34.0)
MCHC: 34.2 g/dL (ref 30.0–36.0)
MCV: 91 fL (ref 78.0–100.0)
MONOS PCT: 13 % — AB (ref 3–12)
Monocytes Absolute: 0.8 10*3/uL (ref 0.1–1.0)
NEUTROS ABS: 3.2 10*3/uL (ref 1.7–7.7)
Neutrophils Relative %: 52 % (ref 43–77)
Platelets: 252 10*3/uL (ref 150–400)
RBC: 4.11 MIL/uL (ref 3.87–5.11)
RDW: 13.1 % (ref 11.5–15.5)
WBC: 6.3 10*3/uL (ref 4.0–10.5)

## 2013-12-29 LAB — URINALYSIS, ROUTINE W REFLEX MICROSCOPIC
BILIRUBIN URINE: NEGATIVE
GLUCOSE, UA: NEGATIVE mg/dL
Hgb urine dipstick: NEGATIVE
Ketones, ur: NEGATIVE mg/dL
Leukocytes, UA: NEGATIVE
Nitrite: NEGATIVE
PH: 6 (ref 5.0–8.0)
PROTEIN: NEGATIVE mg/dL
Specific Gravity, Urine: 1.027 (ref 1.005–1.030)
Urobilinogen, UA: 1 mg/dL (ref 0.0–1.0)

## 2013-12-29 LAB — WET PREP, GENITAL
Trich, Wet Prep: NONE SEEN
Yeast Wet Prep HPF POC: NONE SEEN

## 2013-12-29 LAB — HCG, QUANTITATIVE, PREGNANCY: HCG, BETA CHAIN, QUANT, S: 147248 m[IU]/mL — AB (ref <5)

## 2013-12-29 MED ORDER — PROMETHAZINE HCL 25 MG PO TABS: 25.0000 mg | ORAL_TABLET | Freq: Four times a day (QID) | ORAL | Status: DC | PRN

## 2013-12-29 MED ORDER — SODIUM CHLORIDE 0.9 % IV BOLUS (SEPSIS): 500.0000 mL | Freq: Once | INTRAVENOUS | Status: DC

## 2013-12-29 MED ORDER — ACETAMINOPHEN-CODEINE #3 300-30 MG PO TABS: 1.0000 | ORAL_TABLET | Freq: Four times a day (QID) | ORAL | Status: DC | PRN

## 2013-12-29 NOTE — ED Provider Notes (Signed)
CSN: [id number]     Arrival date & time 12/29/13  0705 History   First MD Initiated Contact with Patient 12/29/13 0715     Chief Complaint  Patient presents with  . Abdominal Pain   HPI Patient with history of ectopic pregnancy 8 years ago status post surgical removal of the right fallopian tube.  Patient states she's been having lower abdominal cramping since last month. Patient was seen in the emergency room last month for similar symptoms. She was diagnosed with intrauterine pregnancy. Patient has followed up with the OB/GYN Dr. and was told that everything seemed okay. Patient states her lower abdominal pain is persistent. It seems to be getting worse. She was not able to sleep well last night. She has not had any trouble with vomiting or diarrhea. She has not had any trouble with vaginal bleeding or discharge.  Past Medical History  Diagnosis Date  . Cancer     cervical  . Extrauterine pregnancy   . Vertigo   . Adenocarcinoma of Bartholin's gland, stage 1   . Vaginal Pap smear, abnormal     2005; had cryo   Past Surgical History  Procedure Laterality Date  . Cesarean section    . Dilation and curettage of uterus    . Cryotherapy     Family History  Problem Relation Age of Onset  . Hypertension Mother   . Stroke Mother   . Heart disease Mother   . Epilepsy Mother   . Heart disease Paternal Aunt   . Hypertension Maternal Grandmother   . Diabetes Maternal Grandmother   . Cancer Maternal Grandmother    History  Substance Use Topics  . Smoking status: Former Smoker    Types: Cigarettes  . Smokeless tobacco: Never Used     Comment: social somoker  . Alcohol Use: No     Comment: occasionally   OB History   Grav Para Term Preterm Abortions TAB SAB Ect Mult Living   5 2 2  0 2  1 1  2      Review of Systems  All other systems reviewed and are negative.     Allergies  Bactrim and Oxycodone  Home Medications   Prior to Admission medications   Medication Sig Start  Date End Date Taking? Authorizing Provider  cephALEXin (KEFLEX) 500 MG capsule Take 1 capsule (500 mg total) by mouth 4 (four) times daily. 12/04/13   Farris Has, PA-C  meclizine (ANTIVERT) 25 MG tablet Take 25 mg by mouth 3 (three) times daily as needed for dizziness.    Historical Provider, MD  ondansetron (ZOFRAN ODT) 4 MG disintegrating tablet Take 1 tablet (4 mg total) by mouth every 8 (eight) hours as needed for nausea or vomiting. 12/16/13   Osborne Oman, MD  Prenatal Multivit-Min-Fe-FA (PRENATAL VITAMINS) 0.8 MG tablet Take 1 tablet by mouth daily. 12/16/13   Donnamae Jude, MD  promethazine (PHENERGAN) 25 MG tablet Take 1 tablet (25 mg total) by mouth every 6 (six) hours as needed for nausea or vomiting. 11/04/13   Hulen Luster, DO   BP 104/61  Pulse 72  Temp(Src) 98.1 F (36.7 C) (Oral)  Resp 17  Ht 5\' 2"  (1.575 m)  Wt 124 lb 8 oz (56.473 kg)  BMI 22.77 kg/m2  SpO2 97%  LMP 10/30/2013 Physical Exam  Nursing note and vitals reviewed. Constitutional: She appears well-developed and well-nourished. No distress.  HENT:  Head: Normocephalic and atraumatic.  Right Ear: External ear normal.  Left Ear: External ear normal.  Eyes: Conjunctivae are normal. Right eye exhibits no discharge. Left eye exhibits no discharge. No scleral icterus.  Neck: Neck supple. No tracheal deviation present.  Cardiovascular: Normal rate, regular rhythm and intact distal pulses.   Pulmonary/Chest: Effort normal and breath sounds normal. No stridor. No respiratory distress. She has no wheezes. She has no rales.  Abdominal: Soft. Bowel sounds are normal. She exhibits no distension. There is tenderness in the suprapubic area. There is no rebound and no guarding.  Genitourinary: Uterus is tender (enlarged, gravid). Cervix exhibits no motion tenderness. Right adnexum displays no mass and no tenderness. Left adnexum displays mass and tenderness. No erythema around the vagina. No signs of injury around the  vagina. Vaginal discharge (white) found.  Musculoskeletal: She exhibits no edema and no tenderness.  Neurological: She is alert. She has normal strength. No cranial nerve deficit (no facial droop, extraocular movements intact, no slurred speech) or sensory deficit. She exhibits normal muscle tone. She displays no seizure activity. Coordination normal.  Skin: Skin is warm and dry. No rash noted.  Psychiatric: She has a normal mood and affect.    ED Course  Procedures (including critical care time) Labs Review Labs Reviewed  WET PREP, GENITAL - Abnormal; Notable for the following:    Clue Cells Wet Prep HPF POC FEW (*)    WBC, Wet Prep HPF POC FEW (*)    All other components within normal limits  CBC WITH DIFFERENTIAL - Abnormal; Notable for the following:    Monocytes Relative 13 (*)    All other components within normal limits  HCG, QUANTITATIVE, PREGNANCY - Abnormal; Notable for the following:    hCG, Beta Chain, Laqueta Carina [id number] (*)    All other components within normal limits  URINALYSIS, ROUTINE W REFLEX MICROSCOPIC    Imaging Review No results found. CLINICAL DATA: Pregnancy with inconclusive fetal viability. Unsure  of LMP. Vaginal bleeding.  EXAM:  TRANSVAGINAL OB ULTRASOUND  TECHNIQUE:  Transvaginal ultrasound was performed for complete evaluation of the  gestation as well as the maternal uterus, adnexal regions, and  pelvic cul-de-sac.  COMPARISON: 11/1913  FINDINGS:  Intrauterine gestational sac: Visualized/normal in shape.  Yolk sac: Visualized  Embryo: Visualized  Cardiac Activity: Visualized  Heart Rate: 137 bpm  CRL: 4 mm 6 w 1 d [place] EDC: 08/05/2014  Maternal uterus/adnexae: No adnexal mass or free fluid identified.  Both ovaries are normal in appearance.  IMPRESSION:  Single living IUP measuring 6 weeks 1 day with [place] EDC of 08/05/2014.  No significant maternal uterine or adnexal abnormality identified.  Electronically Signed  By: Earle Gell M.D.  On:  12/11/2013 16:48   Brief bedside US: IUP with fetal cardiac activity, images saved   MDM   Final diagnoses:  Abdominal pain in pregnancy   The patient continues to have abdominal pain associated with her pregnancy. She is not having any fever. She's not having any vomiting. Is not having any vaginal bleeding. Her exam is unremarkable. Laboratory tests are reassuring.  At this time there does not appear to be any evidence of an acute emergency medical condition and the patient appears stable for discharge with appropriate outpatient follow up. The patient to take Tylenol for pain.  I encouraged to follow up with her OB/GYN doctor    Kathalene Frames, MD 12/29/13 [phone]

## 2013-12-29 NOTE — ED Notes (Signed)
Pt from home with c/o lower sharp abdominal pain.  Pt is 8 - [redacted] weeks pregnant and has never had any pain like this before but her OBGYN during several visits has told her it is the normal "stretching of her belly."  Pt denies urinary changes, vaginal bleeding or discharge, or any other symptoms.  Pt ambulatory, in NAD, A&O.

## 2013-12-29 NOTE — Discharge Instructions (Signed)
Pelvic Pain, Female °Female pelvic pain can be caused by many different things and start from a variety of places. Pelvic pain refers to pain that is located in the lower half of the abdomen and between your hips. The pain may occur over a short period of time (acute) or may be reoccurring (chronic). The cause of pelvic pain may be related to disorders affecting the female reproductive organs (gynecologic), but it may also be related to the bladder, kidney stones, an intestinal complication, or muscle or skeletal problems. Getting help right away for pelvic pain is important, especially if there has been severe, sharp, or a sudden onset of unusual pain. It is also important to get help right away because some types of pelvic pain can be life threatening.  °CAUSES  °Below are only some of the causes of pelvic pain. The causes of pelvic pain can be in one of several categories.  °· Gynecologic. °· Pelvic inflammatory disease. °· Sexually transmitted infection. °· Ovarian cyst or a twisted ovarian ligament (ovarian torsion). °· Uterine lining that grows outside the uterus (endometriosis). °· Fibroids, cysts, or tumors. °· Ovulation. °· Pregnancy. °· Pregnancy that occurs outside the uterus (ectopic pregnancy). °· Miscarriage. °· Labor. °· Abruption of the placenta or ruptured uterus. °· Infection. °· Uterine infection (endometritis). °· Bladder infection. °· Diverticulitis. °· Miscarriage related to a uterine infection (septic abortion). °· Bladder. °· Inflammation of the bladder (cystitis). °· Kidney stone(s). °· Gastrointenstinal. °· Constipation. °· Diverticulitis. °· Neurologic. °· Trauma. °· Feeling pelvic pain because of mental or emotional causes (psychosomatic). °· Cancers of the bowel or pelvis. °EVALUATION  °Your caregiver will want to take a careful history of your concerns. This includes recent changes in your health, a careful gynecologic history of your periods (menses), and a sexual history. Obtaining  your family history and medical history is also important. Your caregiver may suggest a pelvic exam. A pelvic exam will help identify the location and severity of the pain. It also helps in the evaluation of which organ system may be involved. In order to identify the cause of the pelvic pain and be properly treated, your caregiver may order tests. These tests may include:  °· A pregnancy test. °· Pelvic ultrasonography. °· An X-ray exam of the abdomen. °· A urinalysis or evaluation of vaginal discharge. °· Blood tests. °HOME CARE INSTRUCTIONS  °· Only take over-the-counter or prescription medicines for pain, discomfort, or fever as directed by your caregiver.   °· Rest as directed by your caregiver.   °· Eat a balanced diet.   °· Drink enough fluids to make your urine clear or pale yellow, or as directed.   °· Avoid sexual intercourse if it causes pain.   °· Apply warm or cold compresses to the lower abdomen depending on which one helps the pain.   °· Avoid stressful situations.   °· Keep a journal of your pelvic pain. Write down when it started, where the pain is located, and if there are things that seem to be associated with the pain, such as food or your menstrual cycle. °· Follow up with your caregiver as directed.   °SEEK MEDICAL CARE IF: °· Your medicine does not help your pain. °· You have abnormal vaginal discharge. °SEEK IMMEDIATE MEDICAL CARE IF:  °· You have heavy bleeding from the vagina.   °· Your pelvic pain increases.   °· You feel lightheaded or faint.   °· You have chills.   °· You have pain with urination or blood in your urine.   °· You have uncontrolled   diarrhea or vomiting.   °· You have a fever or persistent symptoms for more than 3 days. °· You have a fever and your symptoms suddenly get worse.   °· You are being physically or sexually abused.   °MAKE SURE YOU: °· Understand these instructions. °· Will watch your condition. °· Will get help if you are not doing well or get worse. °Document  Released: 06/28/2004 Document Revised: 01/31/2012 Document Reviewed: 11/21/2011 °[employer]® Patient Information ©2014 [employer]. ° °

## 2013-12-29 NOTE — MAU Note (Signed)
Pt was seen @ [facility] this morning for abdominal pain, states they told her to follow up with her OB.  States her abdominal pain continues, is sharp, & is now having severe nausea.  Pt took Zofran after her DC from [facility].

## 2013-12-29 NOTE — Discharge Instructions (Signed)
Abdominal Pain During Pregnancy °Abdominal pain is common in pregnancy. Most of the time, it does not cause harm. There are many causes of abdominal pain. Some causes are more serious than others. Some of the causes of abdominal pain in pregnancy are easily diagnosed. Occasionally, the diagnosis takes time to understand. Other times, the cause is not determined. Abdominal pain can be a sign that something is very wrong with the pregnancy, or the pain may have nothing to do with the pregnancy at all. For this reason, always tell your health care provider if you have any abdominal discomfort. °HOME CARE INSTRUCTIONS  °Monitor your abdominal pain for any changes. The following actions may help to alleviate any discomfort you are experiencing: °· Do not have sexual intercourse or put anything in your vagina until your symptoms go away completely. °· Get plenty of rest until your pain improves. °· Drink clear fluids if you feel nauseous. Avoid solid food as long as you are uncomfortable or nauseous. °· Only take over-the-counter or prescription medicine as directed by your health care provider. °· Keep all follow-up appointments with your health care provider. °SEEK IMMEDIATE MEDICAL CARE IF: °· You are bleeding, leaking fluid, or passing tissue from the vagina. °· You have increasing pain or cramping. °· You have persistent vomiting. °· You have painful or bloody urination. °· You have a fever. °· You notice a decrease in your baby's movements. °· You have extreme weakness or feel faint. °· You have shortness of breath, with or without abdominal pain. °· You develop a severe headache with abdominal pain. °· You have abnormal vaginal discharge with abdominal pain. °· You have persistent diarrhea. °· You have abdominal pain that continues even after rest, or gets worse. °MAKE SURE YOU:  °· Understand these instructions. °· Will watch your condition. °· Will get help right away if you are not doing well or get  worse. °Document Released: 08/01/2005 Document Revised: 05/22/2013 Document Reviewed: 02/28/2013 °ExitCare® Patient Information ©2014 [employer]. ° °

## 2013-12-29 NOTE — MAU Provider Note (Signed)
History     CSN: [id number]  Arrival date and time: 12/29/13 1041   First Provider Initiated Contact with Patient 12/29/13 1146      Chief Complaint  Patient presents with  . Abdominal Pain  . Morning Sickness   HPI Comments: Crystal Jenkins 28 y.o. [id number] [redacted]w[redacted]d presents to MAU with abdominal discomfort. She was seen at [facility] today released and came here. She said a bedside ultrasound was done and everything was fine. The pains have been off and on for the last 6 weeks. Movement makes worse. She was told by Dr Kennon Rounds that she had round ligament pain.   Abdominal Pain Associated symptoms include nausea and vomiting.      Past Medical History  Diagnosis Date  . Cancer     cervical  . Extrauterine pregnancy   . Vertigo   . Adenocarcinoma of Bartholin's gland, stage 1   . Vaginal Pap smear, abnormal     2005; had cryo  . Headache(784.0)   . Vertigo     Past Surgical History  Procedure Laterality Date  . Cesarean section    . Dilation and curettage of uterus    . Cryotherapy      Family History  Problem Relation Age of Onset  . Hypertension Mother   . Stroke Mother   . Heart disease Mother   . Epilepsy Mother   . Heart disease Paternal Aunt   . Hypertension Maternal Grandmother   . Diabetes Maternal Grandmother   . Cancer Maternal Grandmother     History  Substance Use Topics  . Smoking status: Former Smoker    Types: Cigarettes  . Smokeless tobacco: Never Used     Comment: social somoker  . Alcohol Use: No     Comment: occasionally    Allergies:  Allergies  Allergen Reactions  . Bactrim [Sulfamethoxazole-Trimethoprim] Other (See Comments)    Constipation   . Oxycodone Itching    Prescriptions prior to admission  Medication Sig Dispense Refill  . ondansetron (ZOFRAN ODT) 4 MG disintegrating tablet Take 1 tablet (4 mg total) by mouth every 8 (eight) hours as needed for nausea or vomiting.  42 tablet  2  . Prenatal Multivit-Min-Fe-FA  (PRENATAL VITAMINS) 0.8 MG tablet Take 1 tablet by mouth daily.  30 tablet  12    Review of Systems  Constitutional: Negative.   HENT: Negative.   Eyes: Negative.   Respiratory: Negative.   Cardiovascular: Negative.   Gastrointestinal: Positive for nausea, vomiting and abdominal pain.  Genitourinary: Negative.   Musculoskeletal: Negative.   Skin: Negative.   Neurological: Negative.   Psychiatric/Behavioral: Negative.    Physical Exam   Blood pressure 93/56, pulse 64, temperature 98.4 F (36.9 C), temperature source Oral, resp. rate 16, last menstrual period 10/30/2013, unknown if currently breastfeeding.  Physical Exam  Constitutional: She appears well-developed and well-nourished. No distress.  HENT:  Head: Normocephalic and atraumatic.  Eyes: Pupils are equal, round, and reactive to light.  GI: Soft. There is tenderness.  Over uterus  Genitourinary:  Genital:External negative Vaginal:small amt white discharge Cervix:closed/ thick Bimanual:uterine tenderness     MAU Course  Procedures  MDM  Today has had wet prep, UA, CBC, bedside ultrasound and BHCG done at [facility] hospital  Assessment and Plan   A: Abdominal pain in pregnancy  P: Advised to purchase maternity belt, use warm soaks and get in pool Tylenol # 3 prn pain ( 10 tabs only ) use sparingly Phenergan 25 mg po  q6 hours Follow up in OB clinic as needed  Olegario Messier 12/29/2013, 12:06 PM

## 2013-12-30 NOTE — MAU Provider Note (Signed)
Attestation of Attending Supervision of Advanced Practitioner (CNM/NP): Evaluation and management procedures were performed by the Advanced Practitioner under my supervision and collaboration.  I have reviewed the Advanced Practitioner's note and chart, and I agree with the management and plan.  Crystal Jenkins 12/30/2013 12:21 AM

## 2014-01-07 ENCOUNTER — Telehealth: Payer: Self-pay | Admitting: *Deleted

## 2014-01-07 NOTE — Telephone Encounter (Signed)
Pt called nurse line complaining of abd pain that is without relief.  States she has an appointment on June 1st , would like to be seen sooner.  Attempted to call patient, no answer, unable to leave message.

## 2014-01-08 ENCOUNTER — Inpatient Hospital Stay (HOSPITAL_COMMUNITY)
Admission: AD | Admit: 2014-01-08 | Discharge: 2014-01-08 | Disposition: A | Discharge status: Home / Self Care | Payer: Medicaid Other | Source: Ambulatory Visit | Attending: Family Medicine | Admitting: Family Medicine

## 2014-01-08 ENCOUNTER — Encounter (HOSPITAL_COMMUNITY): Payer: Self-pay | Admitting: *Deleted

## 2014-01-08 DIAGNOSIS — Z87891 Personal history of nicotine dependence: Secondary | ICD-10-CM | POA: Insufficient documentation

## 2014-01-08 DIAGNOSIS — O210 Mild hyperemesis gravidarum: Secondary | ICD-10-CM | POA: Insufficient documentation

## 2014-01-08 DIAGNOSIS — R109 Unspecified abdominal pain: Secondary | ICD-10-CM | POA: Insufficient documentation

## 2014-01-08 DIAGNOSIS — O219 Vomiting of pregnancy, unspecified: Secondary | ICD-10-CM

## 2014-01-08 LAB — URINALYSIS, ROUTINE W REFLEX MICROSCOPIC
Bilirubin Urine: NEGATIVE
Glucose, UA: NEGATIVE mg/dL
Ketones, ur: 40 mg/dL — AB
LEUKOCYTES UA: NEGATIVE
Nitrite: NEGATIVE
PROTEIN: NEGATIVE mg/dL
Urobilinogen, UA: 0.2 mg/dL (ref 0.0–1.0)
pH: 5.5 (ref 5.0–8.0)

## 2014-01-08 LAB — URINE MICROSCOPIC-ADD ON

## 2014-01-08 MED ORDER — SODIUM CHLORIDE 0.9 % IV SOLN
25.0000 mg | Freq: Once | INTRAVENOUS | Status: AC
  Administered 2014-01-08: 25 mg via INTRAVENOUS
  Filled 2014-01-08: qty 1

## 2014-01-08 MED ORDER — LACTATED RINGERS IV BOLUS (SEPSIS)
1000.0000 mL | Freq: Once | INTRAVENOUS | Status: AC
  Administered 2014-01-08: 1000 mL via INTRAVENOUS

## 2014-01-08 MED ORDER — PROMETHAZINE HCL 25 MG RE SUPP: 25.0000 mg | Freq: Four times a day (QID) | RECTAL | Status: DC | PRN

## 2014-01-08 MED ORDER — PROMETHAZINE HCL 25 MG PO TABS: 25.0000 mg | ORAL_TABLET | Freq: Four times a day (QID) | ORAL | Status: DC | PRN

## 2014-01-08 MED ORDER — GLYCOPYRROLATE 1 MG PO TABS: 1.0000 mg | ORAL_TABLET | Freq: Three times a day (TID) | ORAL | Status: DC

## 2014-01-08 MED ORDER — GLYCOPYRROLATE 0.2 MG/ML IJ SOLN
0.2000 mg | Freq: Once | INTRAMUSCULAR | Status: AC
  Administered 2014-01-08: 0.2 mg via INTRAVENOUS
  Filled 2014-01-08: qty 1

## 2014-01-08 NOTE — MAU Provider Note (Signed)
History     CSN: [id number]  Arrival date and time: 01/08/14 1452   First Provider Initiated Contact with Patient 01/08/14 1804      Chief Complaint  Patient presents with  . Abdominal Pain   HPI Comments: Crystal Jenkins 28 y.o. [id number] [redacted]w[redacted]d presents with worsening abdominal pain over past month and nausea/vomiting over past 3 days. Reports not being able to keep down fluids or food for 3 days despite use of Phenergan and Zofran previously prescribed. Cramping, intermittent pain rated 8/10 when present is located suprapubically, worsens with standing at work. Prescribed Tylenol #3 for the pain but has not taken any of it due to worry about "cleft lip" risk during pregnancy. Some pink blood noted when wiping earlier today, not currently occuring. US Transvaginal on 4/29 showed  "Single living IUP measuring 6 weeks 1 day with [place] EDC of 08/05/2014. No significant maternal uterine or adnexal abnormality identified." Previously diagnosed with adenocarcinoma of Bartholin's gland "8-9 months ago" in [facility], [place], [place]. Moved to [place] before she could see an OBGYN there to discuss treatment plan.    OB History   Grav Para Term Preterm Abortions TAB SAB Ect Mult Living   5 2 2  0 2  1 1  2       Past Medical History  Diagnosis Date  . Cancer     cervical  . Extrauterine pregnancy   . Vertigo   . Adenocarcinoma of Bartholin's gland, stage 1   . Vaginal Pap smear, abnormal     2005; had cryo  . Headache(784.0)   . Vertigo     Past Surgical History  Procedure Laterality Date  . Cesarean section    . Dilation and curettage of uterus    . Cryotherapy      Family History  Problem Relation Age of Onset  . Hypertension Mother   . Stroke Mother   . Heart disease Mother   . Epilepsy Mother   . Heart disease Paternal Aunt   . Hypertension Maternal Grandmother   . Diabetes Maternal Grandmother   . Cancer Maternal Grandmother     History  Substance Use Topics  .  Smoking status: Former Smoker    Types: Cigarettes  . Smokeless tobacco: Never Used     Comment: social somoker  . Alcohol Use: No     Comment: occasionally    Allergies:  Allergies  Allergen Reactions  . Bactrim [Sulfamethoxazole-Trimethoprim] Other (See Comments)    Constipation   . Oxycodone Itching    Prescriptions prior to admission  Medication Sig Dispense Refill  . ondansetron (ZOFRAN ODT) 4 MG disintegrating tablet Take 1 tablet (4 mg total) by mouth every 8 (eight) hours as needed for nausea or vomiting.  42 tablet  2  . Prenatal Multivit-Min-Fe-FA (PRENATAL VITAMINS) 0.8 MG tablet Take 1 tablet by mouth daily.  30 tablet  12  . promethazine (PHENERGAN) 25 MG tablet Take 1 tablet (25 mg total) by mouth every 6 (six) hours as needed for nausea or vomiting.  30 tablet  0    Review of Systems  Constitutional: Positive for malaise/fatigue. Negative for chills.  Respiratory: Negative.   Cardiovascular: Negative.   Genitourinary: Negative for urgency.  Neurological: Negative.   Psychiatric/Behavioral: Negative.   All other systems reviewed and are negative.  Physical Exam   Blood pressure 119/83, pulse 71, temperature 99.3 F (37.4 C), temperature source Oral, resp. rate 16, height 5\' 1"  (1.549 m), weight 53.887 kg (118  lb 12.8 oz), last menstrual period 10/30/2013, SpO2 100.00%, unknown if currently breastfeeding.  Physical Exam  Nursing note and vitals reviewed. Constitutional: She is oriented to person, place, and time. She appears well-developed and well-nourished.  Uncomfortable appearing.  HENT:  Head: Normocephalic and atraumatic.  Eyes: Pupils are equal, round, and reactive to light.  Neck: Normal range of motion.  Cardiovascular: Normal rate, regular rhythm and normal heart sounds.   Respiratory: Effort normal and breath sounds normal.  GI: Soft. Bowel sounds are normal. There is tenderness (suprapubic area). There is no rebound.  Musculoskeletal: Normal  range of motion.  Neurological: She is alert and oriented to person, place, and time.  Skin: Skin is warm and dry.  Psychiatric: She has a normal mood and affect. Her behavior is normal. Judgment and thought content normal.   FHR: in 150's  Results for orders placed during the hospital encounter of 01/08/14 (from the past 24 hour(s))  URINALYSIS, ROUTINE W REFLEX MICROSCOPIC     Status: Abnormal   Collection Time    01/08/14  3:40 PM      Result Value Ref Range   Color, Urine YELLOW  YELLOW   APPearance CLEAR  CLEAR   Specific Gravity, Urine >1.030 (*) 1.005 - 1.030   pH 5.5  5.0 - 8.0   Glucose, UA NEGATIVE  NEGATIVE mg/dL   Hgb urine dipstick TRACE (*) NEGATIVE   Bilirubin Urine NEGATIVE  NEGATIVE   Ketones, ur 40 (*) NEGATIVE mg/dL   Protein, ur NEGATIVE  NEGATIVE mg/dL   Urobilinogen, UA 0.2  0.0 - 1.0 mg/dL   Nitrite NEGATIVE  NEGATIVE   Leukocytes, UA NEGATIVE  NEGATIVE  URINE MICROSCOPIC-ADD ON     Status: Abnormal   Collection Time    01/08/14  3:40 PM      Result Value Ref Range   Squamous Epithelial / LPF MANY (*) RARE   WBC, UA 0-2  <3 WBC/hpf   RBC / HPF 0-2  <3 RBC/hpf   Bacteria, UA MANY (*) RARE   Urine-Other MUCOUS PRESENT      MAU Course  Procedures  MDM Phenergan 25 mg in 1028ml NS and 1L Lactated Ringer's  Assessment and Plan  A: Abdominal Pain Nausea/Vomiting  P:Follow-up with medical records requested from [place] 01/08/2014, 6:58 PM   Care of pt assumed by Manya Silvas, CNM at 2000. IV fluids infusing. Robinul ordered for ptyalism.   Feeling better. Keeping down POs. States cramping was generalized, not low. R/T vomiting.  FHT 145 by informal BS [place]. Verified by Dr. Elonda Husky.   ASSESSMENT: 1. Nausea and vomiting of pregnancy, antepartum    PLAN: Discharge him in stable condition. Bleeding precautions. Pelvic rest x1. Advance diet slowly. Follow-up Information   Follow up with [facility] On  01/13/2014. (As scheduled. [phone])    Contact information:   [place] [address]       Follow up with [facility]. (As needed in emergencies)    Contact information:   [address] [id number] [place] [address] [phone]        Medication List         glycopyrrolate 1 MG tablet  Commonly known as:  ROBINUL  Take 1-2 tablets (1-2 mg total) by mouth 3 (three) times daily.     ondansetron 4 MG disintegrating tablet  Commonly known as:  ZOFRAN ODT  Take 1  tablet (4 mg total) by mouth every 8 (eight) hours as needed for nausea or vomiting.     Prenatal Vitamins 0.8 MG tablet  Take 1 tablet by mouth daily.     promethazine 25 MG tablet  Commonly known as:  PHENERGAN  Take 1 tablet (25 mg total) by mouth every 6 (six) hours as needed for nausea or vomiting.     promethazine 25 MG suppository  Commonly known as:  PHENERGAN  Place 1 suppository (25 mg total) rectally every 6 (six) hours as needed for nausea.       [place], [place] 01/08/2014 9:16 PM

## 2014-01-08 NOTE — Discharge Instructions (Signed)
Morning Sickness Morning sickness is when you feel sick to your stomach (nauseous) during pregnancy. This nauseous feeling may or may not come with vomiting. It often occurs in the morning but can be a problem any time of day. Morning sickness is most common during the first trimester, but it may continue throughout pregnancy. While morning sickness is unpleasant, it is usually harmless unless you develop severe and continual vomiting (hyperemesis gravidarum). This condition requires more intense treatment.  CAUSES  The cause of morning sickness is not completely known but seems to be related to normal hormonal changes that occur in pregnancy. RISK FACTORS You are at greater risk if you:  Experienced nausea or vomiting before your pregnancy.  Had morning sickness during a previous pregnancy.  Are pregnant with more than one baby, such as twins. TREATMENT  Do not use any medicines (prescription, over-the-counter, or herbal) for morning sickness without first talking to your health care provider. Your health care provider may prescribe or recommend:  Vitamin B6 supplements.  Anti-nausea medicines.  The herbal medicine ginger. HOME CARE INSTRUCTIONS   Only take over-the-counter or prescription medicines as directed by your health care provider.  Taking multivitamins before getting pregnant can prevent or decrease the severity of morning sickness in most women.   Eat a piece of dry toast or unsalted crackers before getting out of bed in the morning.   Eat five or six small meals a day.   Eat dry and bland foods (rice, baked potato). Foods high in carbohydrates are often helpful.  Do not drink liquids with your meals. Drink liquids between meals.   Avoid greasy, fatty, and spicy foods.   Get someone to cook for you if the smell of any food causes nausea and vomiting.   If you feel nauseous after taking prenatal vitamins, take the vitamins at night or with a snack.  Snack  on protein foods (nuts, yogurt, cheese) between meals if you are hungry.   Eat unsweetened gelatins for desserts.   Wearing an acupressure wristband (worn for sea sickness) may be helpful.   Acupuncture may be helpful.   Do not smoke.   Get a humidifier to keep the air in your house free of odors.   Get plenty of fresh air. SEEK MEDICAL CARE IF:   Your home remedies are not working, and you need medicine.  You feel dizzy or lightheaded.  You are losing weight. SEEK IMMEDIATE MEDICAL CARE IF:   You have persistent and uncontrolled nausea and vomiting.  You pass out (faint). Document Released: 09/22/2006 Document Revised: 04/03/2013 Document Reviewed: 01/16/2013 [facility] Patient Information 2014 [place].  Hyperemesis Gravidarum Diet Hyperemesis gravidarum is a severe form of morning sickness. It is characterized by frequent and severe vomiting. It happens during the first trimester of pregnancy. It may be caused by the rapid hormone changes that happen during pregnancy. It is associated with a 5% weight loss of pre-pregnancy weight. The hyperemesis diet may be used to lessen symptoms of nausea and vomiting. EATING GUIDELINES  Eat 5 to 6 small meals daily instead of 3 large meals.  Avoid foods with strong smells.  Avoid drinking 30 minutes before and after meals.  Avoid fried or high-fat foods, such as butter and cream sauces.  Starchy foods are usually well-tolerated, such as cereal, toast, bread, potatoes, pasta, rice, and pretzels.  Eat crackers before you get out of bed in the morning.  Avoid spicy foods.  Ginger may help with nausea. Add  tsp  ginger to hot tea or choose ginger tea.  Continue to take your prenatal vitamins as directed by your caregiver. SAMPLE MEAL PLAN Breakfast    cup oatmeal  1 slice toast  1 tsp heart-healthy margarine  1 tsp jelly  1 scrambled egg Midmorning Snack   1 cup low-fat yogurt Lunch   Plain ham  sandwich  Carrot or celery sticks  1 small apple  3 graham crackers Midafternoon Snack   Cheese and crackers Dinner  4 oz pork tenderloin  1 small baked potato  1 tsp margarine   cup broccoli   cup grapes Evening Snack  1 cup pudding Document Released: 05/29/2007 Document Revised: 10/24/2011 Document Reviewed: 01/01/2013 [employer] Patient Information 2014 [place], [place].   Vaginal Bleeding During Pregnancy, First Trimester A small amount of bleeding (spotting) from the vagina is relatively common in early pregnancy. It usually stops on its own. Various things may cause bleeding or spotting in early pregnancy. Some bleeding may be related to the pregnancy, and some may not. In most cases, the bleeding is normal and is not a problem. However, bleeding can also be a sign of something serious. Be sure to tell your health care provider about any vaginal bleeding right away. Some possible causes of vaginal bleeding during the first trimester include:  Infection or inflammation of the cervix.  Growths (polyps) on the cervix.  Miscarriage or threatened miscarriage.  Pregnancy tissue has developed outside of the uterus and in a fallopian tube (tubal pregnancy).  Tiny cysts have developed in the uterus instead of pregnancy tissue (molar pregnancy). HOME CARE INSTRUCTIONS  Watch your condition for any changes. The following actions may help to lessen any discomfort you are feeling:  Follow your health care provider's instructions for limiting your activity. If your health care provider orders bed rest, you may need to stay in bed and only get up to use the bathroom. However, your health care provider may allow you to continue light activity.  If needed, make plans for someone to help with your regular activities and responsibilities while you are on bed rest.  Keep track of the number of pads you use each day, how often you change pads, and how soaked (saturated) they are.  Write this down.  Do not use tampons. Do not douche.  Do not have sexual intercourse or orgasms until approved by your health care provider.  If you pass any tissue from your vagina, save the tissue so you can show it to your health care provider.  Only take over-the-counter or prescription medicines as directed by your health care provider.  Do not take aspirin because it can make you bleed.  Keep all follow-up appointments as directed by your health care provider. SEEK MEDICAL CARE IF:  You have any vaginal bleeding during any part of your pregnancy.  You have cramps or labor pains. SEEK IMMEDIATE MEDICAL CARE IF:   You have severe cramps in your back or belly (abdomen).  You have a fever, not controlled by medicine.  You pass large clots or tissue from your vagina.  Your bleeding increases.  You feel lightheaded or weak, or you have fainting episodes.  You have chills.  You are leaking fluid or have a gush of fluid from your vagina.  You pass out while having a bowel movement. MAKE SURE YOU:  Understand these instructions.  Will watch your condition.  Will get help right away if you are not doing well or get worse. Document Released: 05/11/2005  Document Revised: 05/22/2013 Document Reviewed: 04/08/2013 [facility] Patient Information 2014 [place].

## 2014-01-08 NOTE — MAU Note (Signed)
Patient states she has had lower abdominal pain since she found out she was pregnant. States it is getting worse. States she had light bleeding on tissue this am but no bleeding now. Has nausea and vomiting for 2 days has not been able to keep anything down.

## 2014-01-09 NOTE — Telephone Encounter (Signed)
Patient came into MAU yesterday for this and received treatment

## 2014-01-10 ENCOUNTER — Telehealth: Payer: Self-pay | Admitting: Medical

## 2014-01-10 DIAGNOSIS — B952 Enterococcus as the cause of diseases classified elsewhere: Secondary | ICD-10-CM

## 2014-01-10 DIAGNOSIS — N390 Urinary tract infection, site not specified: Principal | ICD-10-CM

## 2014-01-10 LAB — CULTURE, OB URINE: Colony Count: >=100000

## 2014-01-10 MED ORDER — NITROFURANTOIN MONOHYD MACRO 100 MG PO CAPS: 100.0000 mg | ORAL_CAPSULE | Freq: Two times a day (BID) | ORAL | Status: DC

## 2014-01-10 NOTE — Telephone Encounter (Signed)
Called patient and informed her of UTI dx and Rx. Patient advised to increased PO hydration as tolerated. Patient voiced understanding.   Farris Has, PA-C 01/10/2014 3:16 PM

## 2014-01-13 ENCOUNTER — Ambulatory Visit: Payer: Medicaid Other | Admitting: Family Medicine

## 2014-01-13 VITALS — BP 98/72 | HR 78 | Temp 98.2°F | Wt 120.3 lb

## 2014-01-13 DIAGNOSIS — Z3490 Encounter for supervision of normal pregnancy, unspecified, unspecified trimester: Secondary | ICD-10-CM

## 2014-01-13 DIAGNOSIS — O219 Vomiting of pregnancy, unspecified: Secondary | ICD-10-CM

## 2014-01-13 DIAGNOSIS — O34219 Maternal care for unspecified type scar from previous cesarean delivery: Secondary | ICD-10-CM

## 2014-01-13 LAB — POCT URINALYSIS DIP (DEVICE)
Bilirubin Urine: NEGATIVE
Glucose, UA: NEGATIVE mg/dL
Hgb urine dipstick: NEGATIVE
Ketones, ur: NEGATIVE mg/dL
Leukocytes, UA: NEGATIVE
NITRITE: NEGATIVE
PROTEIN: NEGATIVE mg/dL
Specific Gravity, Urine: >=1.03 (ref 1.005–1.030)
UROBILINOGEN UA: 0.2 mg/dL (ref 0.0–1.0)
pH: 6.5 (ref 5.0–8.0)

## 2014-01-13 NOTE — Progress Notes (Signed)
C/o of pelvic pain especially when moving around.  Reports spotting last Wednesday morning; has not experienced since.

## 2014-01-13 NOTE — Patient Instructions (Signed)
Pregnancy - First Trimester  During sexual intercourse, millions of sperm go into the vagina. Only 1 sperm will penetrate and fertilize the female egg while it is in the Fallopian tube. One week later, the fertilized egg implants into the wall of the uterus. An embryo begins to develop into a baby. At 6 to 8 weeks, the eyes and face are formed and the heartbeat can be seen on ultrasound. At the end of 12 weeks (first trimester), all the baby's organs are formed. Now that you are pregnant, you will want to do everything you can to have a healthy baby. Two of the most important things are to get good prenatal care and follow your caregiver's instructions. Prenatal care is all the medical care you receive before the baby's birth. It is given to prevent, find, and treat problems during the pregnancy and childbirth.  PRENATAL EXAMS  · During prenatal visits, your weight, blood pressure, and urine are checked. This is done to make sure you are healthy and progressing normally during the pregnancy.  · A pregnant woman should gain 25 to 35 pounds during the pregnancy. However, if you are overweight or underweight, your caregiver will advise you regarding your weight.  · Your caregiver will ask and answer questions for you.  · Blood work, cervical cultures, other necessary tests, and a Pap test are done during your prenatal exams. These tests are done to check on your health and the probable health of your baby. Tests are strongly recommended and done for HIV with your permission. This is the virus that causes AIDS. These tests are done because medicines can be given to help prevent your baby from being born with this infection should you have been infected without knowing it. Blood work is also used to find out your blood type, previous infections, and follow your blood levels (hemoglobin).  · Low hemoglobin (anemia) is common during pregnancy. Iron and vitamins are given to help prevent this. Later in the pregnancy, blood  tests for diabetes will be done along with any other tests if any problems develop.  · You may need other tests to make sure you and the baby are doing well.  CHANGES DURING THE FIRST TRIMESTER   Your body goes through many changes during pregnancy. They vary from person to person. Talk to your caregiver about changes you notice and are concerned about. Changes can include:  · Your menstrual period stops.  · The egg and sperm carry the genes that determine what you look like. Genes from you and your partner are forming a baby. The female genes determine whether the baby is a boy or a girl.  · Your body increases in girth and you may feel bloated.  · Feeling sick to your stomach (nauseous) and throwing up (vomiting). If the vomiting is uncontrollable, call your caregiver.  · Your breasts will begin to enlarge and become tender.  · Your nipples may stick out more and become darker.  · The need to urinate more. Painful urination may mean you have a bladder infection.  · Tiring easily.  · Loss of appetite.  · Cravings for certain kinds of food.  · At first, you may gain or lose a couple of pounds.  · You may have changes in your emotions from day to day (excited to be pregnant or concerned something may go wrong with the pregnancy and baby).  · You may have more vivid and strange dreams.  HOME CARE INSTRUCTIONS   ·   It is very important to avoid all smoking, alcohol and non-prescribed drugs during your pregnancy. These affect the formation and growth of the baby. Avoid chemicals while pregnant to ensure the delivery of a healthy infant.  · Start your prenatal visits by the 12th week of pregnancy. They are usually scheduled monthly at first, then more often in the last 2 months before delivery. Keep your caregiver's appointments. Follow your caregiver's instructions regarding medicine use, blood and lab tests, exercise, and diet.  · During pregnancy, you are providing food for you and your baby. Eat regular, well-balanced  meals. Choose foods such as meat, fish, milk and other low fat dairy products, vegetables, fruits, and whole-grain breads and cereals. Your caregiver will tell you of the ideal weight gain.  · You can help morning sickness by keeping soda crackers at the bedside. Eat a couple before arising in the morning. You may want to use the crackers without salt on them.  · Eating 4 to 5 small meals rather than 3 large meals a day also may help the nausea and vomiting.  · Drinking liquids between meals instead of during meals also seems to help nausea and vomiting.  · A physical sexual relationship may be continued throughout pregnancy if there are no other problems. Problems may be early (premature) leaking of amniotic fluid from the membranes, vaginal bleeding, or belly (abdominal) pain.  · Exercise regularly if there are no restrictions. Check with your caregiver or physical therapist if you are unsure of the safety of some of your exercises. Greater weight gain will occur in the last 2 trimesters of pregnancy. Exercising will help:  · Control your weight.  · Keep you in shape.  · Prepare you for labor and delivery.  · Help you lose your pregnancy weight after you deliver your baby.  · Wear a good support or jogging bra for breast tenderness during pregnancy. This may help if worn during sleep too.  · Ask when prenatal classes are available. Begin classes when they are offered.  · Do not use hot tubs, steam rooms, or saunas.  · Wear your seat belt when driving. This protects you and your baby if you are in an accident.  · Avoid raw meat, uncooked cheese, cat litter boxes, and soil used by cats throughout the pregnancy. These carry germs that can cause birth defects in the baby.  · The first trimester is a good time to visit your dentist for your dental health. Getting your teeth cleaned is okay. Use a softer toothbrush and brush gently during pregnancy.  · Ask for help if you have financial, counseling, or nutritional needs  during pregnancy. Your caregiver will be able to offer counseling for these needs as well as refer you for other special needs.  · Do not take any medicines or herbs unless told by your caregiver.  · Inform your caregiver if there is any mental or physical domestic violence.  · Make a list of emergency phone numbers of family, friends, hospital, and police and fire departments.  · Write down your questions. Take them to your prenatal visit.  · Do not douche.  · Do not cross your legs.  · If you have to stand for long periods of time, rotate you feet or take small steps in a circle.  · You may have more vaginal secretions that may require a sanitary pad. Do not use tampons or scented sanitary pads.  MEDICINES AND DRUG USE IN PREGNANCY  ·   Take prenatal vitamins as directed. The vitamin should contain 1 milligram of folic acid. Keep all vitamins out of reach of children. Only a couple vitamins or tablets containing iron may be fatal to a baby or young child when ingested.  · Avoid use of all medicines, including herbs, over-the-counter medicines, not prescribed or suggested by your caregiver. Only take over-the-counter or prescription medicines for pain, discomfort, or fever as directed by your caregiver. Do not use aspirin, ibuprofen, or naproxen unless directed by your caregiver.  · Let your caregiver also know about herbs you may be using.  · Alcohol is related to a number of birth defects. This includes fetal alcohol syndrome. All alcohol, in any form, should be avoided completely. Smoking will cause low birth rate and premature babies.  · Street or illegal drugs are very harmful to the baby. They are absolutely forbidden. A baby born to an addicted mother will be addicted at birth. The baby will go through the same withdrawal an adult does.  · Let your caregiver know about any medicines that you have to take and for what reason you take them.  SEEK MEDICAL CARE IF:   You have any concerns or worries during your  pregnancy. It is better to call with your questions if you feel they cannot wait, rather than worry about them.  SEEK IMMEDIATE MEDICAL CARE IF:   · An unexplained oral temperature above 102° F (38.9° C) develops, or as your caregiver suggests.  · You have leaking of fluid from the vagina (birth canal). If leaking membranes are suspected, take your temperature and inform your caregiver of this when you call.  · There is vaginal spotting or bleeding. Notify your caregiver of the amount and how many pads are used.  · You develop a bad smelling vaginal discharge with a change in the color.  · You continue to feel sick to your stomach (nauseated) and have no relief from remedies suggested. You vomit blood or coffee ground-like materials.  · You lose more than 2 pounds of weight in 1 week.  · You gain more than 2 pounds of weight in 1 week and you notice swelling of your face, hands, feet, or legs.  · You gain 5 pounds or more in 1 week (even if you do not have swelling of your hands, face, legs, or feet).  · You get exposed to German measles and have never had them.  · You are exposed to fifth disease or chickenpox.  · You develop belly (abdominal) pain. Round ligament discomfort is a common non-cancerous (benign) cause of abdominal pain in pregnancy. Your caregiver still must evaluate this.  · You develop headache, fever, diarrhea, pain with urination, or shortness of breath.  · You fall or are in a car accident or have any kind of trauma.  · There is mental or physical violence in your home.  Document Released: 07/26/2001 Document Revised: 04/25/2012 Document Reviewed: 01/27/2009  [employer]® Patient Information ©2014 [employer].

## 2014-01-13 NOTE — Progress Notes (Signed)
S: 28 yo [id number] @ [redacted]w[redacted]d here for ROBV - having nausea and pain  - pain is severe enough she is missing work. Given tylenol #3 in MAU but not taking it - nausea "all the time". No vomiting.    O: See Flowsheet  A/P - pain- discussed normal pain in pregnancy likely complicated by multiparity and 2 prior c/s - encouraged stretching and heat  - nausea- declines other treatment at this time.  - f/u in 4 weeks.  - bleeding precautions discussed.

## 2014-01-21 ENCOUNTER — Encounter: Payer: Self-pay | Admitting: Family Medicine

## 2014-01-29 ENCOUNTER — Ambulatory Visit (HOSPITAL_COMMUNITY): Payer: Medicaid Other

## 2014-01-29 ENCOUNTER — Ambulatory Visit (HOSPITAL_COMMUNITY)
Admission: RE | Admit: 2014-01-29 | Discharge: 2014-01-29 | Disposition: A | Discharge status: Home / Self Care | Payer: Medicaid Other | Source: Ambulatory Visit | Attending: Obstetrics & Gynecology | Admitting: Obstetrics & Gynecology

## 2014-01-29 ENCOUNTER — Other Ambulatory Visit: Payer: Self-pay | Admitting: Obstetrics & Gynecology

## 2014-01-29 ENCOUNTER — Other Ambulatory Visit (HOSPITAL_COMMUNITY): Payer: Self-pay | Admitting: Obstetrics and Gynecology

## 2014-01-29 VITALS — BP 107/64 | HR 62 | Wt 121.0 lb

## 2014-01-29 DIAGNOSIS — Z0489 Encounter for examination and observation for other specified reasons: Secondary | ICD-10-CM

## 2014-01-29 DIAGNOSIS — Z3490 Encounter for supervision of normal pregnancy, unspecified, unspecified trimester: Secondary | ICD-10-CM

## 2014-01-29 DIAGNOSIS — Z3682 Encounter for antenatal screening for nuchal translucency: Secondary | ICD-10-CM

## 2014-01-29 DIAGNOSIS — O34219 Maternal care for unspecified type scar from previous cesarean delivery: Secondary | ICD-10-CM

## 2014-01-29 DIAGNOSIS — Z36 Encounter for antenatal screening of mother: Secondary | ICD-10-CM | POA: Insufficient documentation

## 2014-01-29 DIAGNOSIS — IMO0002 Reserved for concepts with insufficient information to code with codable children: Secondary | ICD-10-CM

## 2014-02-04 ENCOUNTER — Encounter: Payer: Self-pay | Admitting: Obstetrics & Gynecology

## 2014-02-06 ENCOUNTER — Ambulatory Visit (INDEPENDENT_AMBULATORY_CARE_PROVIDER_SITE_OTHER): Payer: Medicaid Other | Admitting: Advanced Practice Midwife

## 2014-02-06 VITALS — BP 116/77 | HR 67 | Wt 119.5 lb

## 2014-02-06 DIAGNOSIS — O34219 Maternal care for unspecified type scar from previous cesarean delivery: Secondary | ICD-10-CM

## 2014-02-06 DIAGNOSIS — E860 Dehydration: Secondary | ICD-10-CM

## 2014-02-06 LAB — POCT URINALYSIS DIP (DEVICE)
Bilirubin Urine: NEGATIVE
GLUCOSE, UA: NEGATIVE mg/dL
HGB URINE DIPSTICK: NEGATIVE
Ketones, ur: 40 mg/dL — AB
Leukocytes, UA: NEGATIVE
NITRITE: NEGATIVE
Protein, ur: NEGATIVE mg/dL
Specific Gravity, Urine: 1.02 (ref 1.005–1.030)
UROBILINOGEN UA: 1 mg/dL (ref 0.0–1.0)
pH: 7 (ref 5.0–8.0)

## 2014-02-06 MED ORDER — CYCLOBENZAPRINE HCL 10 MG PO TABS: 5.0000 mg | ORAL_TABLET | Freq: Three times a day (TID) | ORAL | Status: DC | PRN

## 2014-02-06 NOTE — Progress Notes (Signed)
Patient reports pelvic pressure and low back pain.

## 2014-02-06 NOTE — Progress Notes (Signed)
Doing well.  Denies vaginal bleeding, LOF, regular contractions.  Does report constant low back pain and pelvic pressure.  Stands at work sometimes 10 hours.  Recommend increasing water intake, begin wearing pregnancy support belt.  Pt had nausea/vomiting in early pregnancy but this has improved and she can tolerate PO fluids well.  Pt prescribed Tylenol #3 from MAU a few weeks ago but has not taken any because she is unsure about narcotics in pregnancy.  Discussed safety of short course of pain medication.  Rx for Flexeril 5-10 mg TID PRN.  Unable to give urine sample at time of visit. Urine with 40 ketones when sample given.

## 2014-02-11 ENCOUNTER — Encounter: Payer: Medicaid Other | Admitting: Obstetrics and Gynecology

## 2014-02-14 ENCOUNTER — Inpatient Hospital Stay (HOSPITAL_COMMUNITY)
Admission: AD | Admit: 2014-02-14 | Discharge: 2014-02-14 | Disposition: A | Discharge status: Home / Self Care | Payer: Medicaid Other | Source: Ambulatory Visit | Attending: Obstetrics and Gynecology | Admitting: Obstetrics and Gynecology

## 2014-02-14 ENCOUNTER — Inpatient Hospital Stay (HOSPITAL_COMMUNITY): Payer: Medicaid Other

## 2014-02-14 ENCOUNTER — Encounter (HOSPITAL_COMMUNITY): Payer: Self-pay | Admitting: *Deleted

## 2014-02-14 DIAGNOSIS — O9A212 Injury, poisoning and certain other consequences of external causes complicating pregnancy, second trimester: Secondary | ICD-10-CM

## 2014-02-14 DIAGNOSIS — Z87891 Personal history of nicotine dependence: Secondary | ICD-10-CM | POA: Insufficient documentation

## 2014-02-14 DIAGNOSIS — Y92009 Unspecified place in unspecified non-institutional (private) residence as the place of occurrence of the external cause: Secondary | ICD-10-CM | POA: Insufficient documentation

## 2014-02-14 DIAGNOSIS — O99891 Other specified diseases and conditions complicating pregnancy: Secondary | ICD-10-CM | POA: Insufficient documentation

## 2014-02-14 DIAGNOSIS — O9989 Other specified diseases and conditions complicating pregnancy, childbirth and the puerperium: Principal | ICD-10-CM

## 2014-02-14 DIAGNOSIS — N390 Urinary tract infection, site not specified: Secondary | ICD-10-CM | POA: Insufficient documentation

## 2014-02-14 DIAGNOSIS — O2390 Unspecified genitourinary tract infection in pregnancy, unspecified trimester: Secondary | ICD-10-CM | POA: Insufficient documentation

## 2014-02-14 DIAGNOSIS — R109 Unspecified abdominal pain: Secondary | ICD-10-CM | POA: Insufficient documentation

## 2014-02-14 DIAGNOSIS — M549 Dorsalgia, unspecified: Secondary | ICD-10-CM | POA: Insufficient documentation

## 2014-02-14 DIAGNOSIS — O2342 Unspecified infection of urinary tract in pregnancy, second trimester: Secondary | ICD-10-CM

## 2014-02-14 LAB — URINALYSIS, ROUTINE W REFLEX MICROSCOPIC
Bilirubin Urine: NEGATIVE
Glucose, UA: NEGATIVE mg/dL
HGB URINE DIPSTICK: NEGATIVE
Ketones, ur: NEGATIVE mg/dL
Nitrite: POSITIVE — AB
PROTEIN: 30 mg/dL — AB
Specific Gravity, Urine: 1.025 (ref 1.005–1.030)
UROBILINOGEN UA: 0.2 mg/dL (ref 0.0–1.0)
pH: 6 (ref 5.0–8.0)

## 2014-02-14 LAB — URINE MICROSCOPIC-ADD ON

## 2014-02-14 MED ORDER — CYCLOBENZAPRINE HCL 10 MG PO TABS
10.0000 mg | ORAL_TABLET | ORAL | Status: AC
  Administered 2014-02-14: 10 mg via ORAL
  Filled 2014-02-14: qty 1

## 2014-02-14 MED ORDER — NITROFURANTOIN MONOHYD MACRO 100 MG PO CAPS
100.0000 mg | ORAL_CAPSULE | ORAL | Status: AC
  Administered 2014-02-14: 100 mg via ORAL
  Filled 2014-02-14: qty 1

## 2014-02-14 MED ORDER — NITROFURANTOIN MONOHYD MACRO 100 MG PO CAPS: 100.0000 mg | ORAL_CAPSULE | ORAL | Status: DC

## 2014-02-14 NOTE — Discharge Instructions (Signed)
Domestic Abuse You are being battered or abused if someone close to you hits, pushes, or physically hurts you in any way. You also are being abused if you are forced into activities. You are being sexually abused if you are forced to have sexual contact of any kind. You are being emotionally abused if you are made to feel worthless or if you are constantly threatened. It is important to remember that help is available. No one has the right to abuse you. PREVENTION OF FURTHER ABUSE  Learn the warning signs of danger. This varies with situations but may include: the use of alcohol, threats, isolation from friends and family, or forced sexual contact. Leave if you feel that violence is going to occur.  If you are attacked or beaten, report it to the police so the abuse is documented. You do not have to press charges. The police can protect you while you or the attackers are leaving. Get the officer's name and badge number and a copy of the report.  Find someone you can trust and tell them what is happening to you: your caregiver, a nurse, clergy member, close friend or family member. Feeling ashamed is natural, but remember that you have done nothing wrong. No one deserves abuse.  It is important to develop a safety plan if you decide to leave the relationship. You may be at risk of harm if your abuser knows you are leaving. Include the following steps in your plan:  Keep extra clothing for you and your children, medicines, money, important phone numbers and papers, and an extra set of car and house keys at a friend's or neighbor's house.  Tell a supportive friend or family member that you may show up at any time of day or night in an emergency.  If you do not have a close friend or family member, make a list of other safe places to go, such as shelters and crisis centers. Keep an abuse hotline number available.  Consider filing a restraining order if your abuser stalks, harasses, or threatens you in  any way. Document Released: 07/29/2000 Document Revised: 10/24/2011 Document Reviewed: 10/07/2010 [facility] Patient Information 2015 [place], [place]. This information is not intended to replace advice given to you by your health care provider. Make sure you discuss any questions you have with your health care provider. Pregnancy and Urinary Tract Infection A urinary tract infection (UTI) is a bacterial infection of the urinary tract. Infection of the urinary tract can include the ureters, kidneys (pyelonephritis), bladder (cystitis), and urethra (urethritis). All pregnant women should be screened for bacteria in the urinary tract. Identifying and treating a UTI will decrease the risk of preterm labor and developing more serious infections in both the mother and baby. CAUSES Bacteria germs cause almost all UTIs.  RISK FACTORS Many factors can increase your chances of getting a UTI during pregnancy. These include:  Having a short urethra.  Poor toilet and hygiene habits.  Sexual intercourse.  Blockage of urine along the urinary tract.  Problems with the pelvic muscles or nerves.  Diabetes.  Obesity.  Bladder problems after having several children.  Previous history of UTI. SIGNS AND SYMPTOMS   Pain, burning, or a stinging feeling when urinating.  Suddenly feeling the need to urinate right away (urgency).  Loss of bladder control (urinary incontinence).  Frequent urination, more than is common with pregnancy.  Lower abdominal or back discomfort.  Cloudy urine.  Blood in the urine (hematuria).  Fever. When the kidneys  are infected, the symptoms may be:  Back pain.  Flank pain on the right side more so than the left.  Fever.  Chills.  Nausea.  Vomiting. DIAGNOSIS  A urinary tract infection is usually diagnosed through urine tests. Additional tests and procedures are sometimes done. These may include:  Ultrasound exam of the kidneys, ureters, bladder, and  urethra.  Looking in the bladder with a lighted tube (cystoscopy). TREATMENT Typically, UTIs can be treated with antibiotic medicines.  HOME CARE INSTRUCTIONS   Only take over-the-counter or prescription medicines as directed by your health care provider. If you were prescribed antibiotics, take them as directed. Finish them even if you start to feel better.  Drink enough fluids to keep your urine clear or pale yellow.  Do not have sexual intercourse until the infection is gone and your health care provider says it is okay.  Make sure you are tested for UTIs throughout your pregnancy. These infections often come back. Preventing a UTI in the Future  Practice good toilet habits. Always wipe from front to back. Use the tissue only once.  Do not hold your urine. Empty your bladder as soon as possible when the urge comes.  Do not douche or use deodorant sprays.  Wash with soap and warm water around the genital area and the anus.  Empty your bladder before and after sexual intercourse.  Wear underwear with a cotton crotch.  Avoid caffeine and carbonated drinks. They can irritate the bladder.  Drink cranberry juice or take cranberry pills. This may decrease the risk of getting a UTI.  Do not drink alcohol.  Keep all your appointments and tests as scheduled. SEEK MEDICAL CARE IF:   Your symptoms get worse.  You are still having fevers 2 or more days after treatment begins.  You have a rash.  You feel that you are having problems with medicines prescribed.  You have abnormal vaginal discharge. SEEK IMMEDIATE MEDICAL CARE IF:   You have back or flank pain.  You have chills.  You have blood in your urine.  You have nausea and vomiting.  You have contractions of your uterus.  You have a gush of fluid from the vagina. MAKE SURE YOU:  Understand these instructions.   Will watch your condition.   Will get help right away if you are not doing well or get worse.   Document Released: 11/26/2010 Document Revised: 05/22/2013 Document Reviewed: 02/28/2013 [facility] Patient Information 2015 [place], [place]. This information is not intended to replace advice given to you by your health care provider. Make sure you discuss any questions you have with your health care provider.
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
## 2014-02-16 NOTE — MAU Provider Note (Signed)
Attestation of Attending Supervision of Advanced Practitioner (CNM/NP): Evaluation and management procedures were performed by the Advanced Practitioner under my supervision and collaboration.  I have reviewed the Advanced Practitioner's note and chart, and I agree with the management and plan.  Crystal Jenkins 02/16/2014 8:12 PM

## 2014-02-28 ENCOUNTER — Encounter: Payer: Self-pay | Admitting: Obstetrics and Gynecology

## 2014-02-28 ENCOUNTER — Encounter: Payer: Medicaid Other | Admitting: Family Medicine

## 2014-03-05 ENCOUNTER — Encounter: Payer: Self-pay | Admitting: Obstetrics & Gynecology

## 2014-03-05 ENCOUNTER — Ambulatory Visit (INDEPENDENT_AMBULATORY_CARE_PROVIDER_SITE_OTHER): Payer: Medicaid Other | Admitting: Obstetrics & Gynecology

## 2014-03-05 VITALS — BP 108/69 | HR 76 | Temp 97.8°F | Wt 122.2 lb

## 2014-03-05 DIAGNOSIS — K0889 Other specified disorders of teeth and supporting structures: Secondary | ICD-10-CM

## 2014-03-05 DIAGNOSIS — Z3482 Encounter for supervision of other normal pregnancy, second trimester: Secondary | ICD-10-CM

## 2014-03-05 DIAGNOSIS — K089 Disorder of teeth and supporting structures, unspecified: Secondary | ICD-10-CM

## 2014-03-05 DIAGNOSIS — Z3480 Encounter for supervision of other normal pregnancy, unspecified trimester: Secondary | ICD-10-CM

## 2014-03-05 LAB — POCT URINALYSIS DIP (DEVICE)
Glucose, UA: NEGATIVE mg/dL
HGB URINE DIPSTICK: NEGATIVE
KETONES UR: 80 mg/dL — AB
Leukocytes, UA: NEGATIVE
NITRITE: NEGATIVE
PH: 5.5 (ref 5.0–8.0)
Protein, ur: 30 mg/dL — AB
Specific Gravity, Urine: 1.025 (ref 1.005–1.030)
Urobilinogen, UA: 1 mg/dL (ref 0.0–1.0)

## 2014-03-05 MED ORDER — TRAMADOL HCL 50 MG PO TABS: 50.0000 mg | ORAL_TABLET | Freq: Four times a day (QID) | ORAL | Status: DC | PRN

## 2014-03-05 MED ORDER — AMOXICILLIN-POT CLAVULANATE 875-125 MG PO TABS: 1.0000 | ORAL_TABLET | Freq: Two times a day (BID) | ORAL | Status: DC

## 2014-03-05 NOTE — Progress Notes (Signed)
Patient reports not feeling well due to pain on left side of facial, feels like it's a dental infection; also been vomiting this morning

## 2014-03-05 NOTE — Patient Instructions (Signed)
Genetic Testing This testing is done to find many different types of diseases that have abnormal genetics. The testing can be very expensive and is often not covered by insurance. Your caregiver will let you know why the testing is being done and the value of the testing to you. You can then discuss with your caregiver whether or not to proceed with the testing. This test will look for breast cancer genes that may be identified in a person who may have an increased chance for breast cancer. It is also good for colon cancer genetic testing, cardiovascular disease testing, Tay-Sachs disease testing (development of mental retardation in the first few months of life), cystic fibrosis genetic testing. PREPARATION FOR TEST There is no preparation for this test. NORMAL FINDINGS No genetic mutation.  Ranges for normal findings may vary among different laboratories and hospitals. You should always check with your doctor after having lab work or other tests done to discuss the meaning of your test results and whether your values are considered within normal limits. MEANING OF TEST Your caregiver will go over the test results with you and discuss the importance and meaning of your results, as well as treatment options and the need for additional tests if necessary. OBTAINING TEST RESULTS It is your responsibility to obtain your test results. Ask the lab or department performing the test when and how you will get your results. Document Released: 08/24/2004 Document Revised: 10/24/2011 Document Reviewed: 07/12/2008 [facility] Patient Information 2015 [place], [place]. This information is not intended to replace advice given to you by your health care provider. Make sure you discuss any questions you have with your health care provider.

## 2014-03-05 NOTE — Progress Notes (Signed)
Pt c/o severe tooth pain on left side.  She does not have a dentist. HEENT: tenderness to palpation on left upper Augmentin and Ultram prescribed- needs to see a dentist   Quad screen today Anatomy scan next week F/u 4 weeks

## 2014-03-06 ENCOUNTER — Encounter (HOSPITAL_COMMUNITY): Payer: Self-pay | Admitting: General Practice

## 2014-03-06 ENCOUNTER — Inpatient Hospital Stay (HOSPITAL_COMMUNITY)
Admission: AD | Admit: 2014-03-06 | Discharge: 2014-03-06 | Disposition: A | Discharge status: Home / Self Care | Payer: Medicaid Other | Source: Ambulatory Visit | Attending: Obstetrics & Gynecology | Admitting: Obstetrics & Gynecology

## 2014-03-06 DIAGNOSIS — Z87891 Personal history of nicotine dependence: Secondary | ICD-10-CM | POA: Diagnosis not present

## 2014-03-06 DIAGNOSIS — Z8249 Family history of ischemic heart disease and other diseases of the circulatory system: Secondary | ICD-10-CM | POA: Diagnosis not present

## 2014-03-06 DIAGNOSIS — O99891 Other specified diseases and conditions complicating pregnancy: Secondary | ICD-10-CM | POA: Insufficient documentation

## 2014-03-06 DIAGNOSIS — K044 Acute apical periodontitis of pulpal origin: Secondary | ICD-10-CM | POA: Diagnosis not present

## 2014-03-06 DIAGNOSIS — Z823 Family history of stroke: Secondary | ICD-10-CM | POA: Diagnosis not present

## 2014-03-06 DIAGNOSIS — K0889 Other specified disorders of teeth and supporting structures: Secondary | ICD-10-CM

## 2014-03-06 DIAGNOSIS — O34219 Maternal care for unspecified type scar from previous cesarean delivery: Secondary | ICD-10-CM

## 2014-03-06 DIAGNOSIS — O219 Vomiting of pregnancy, unspecified: Secondary | ICD-10-CM

## 2014-03-06 DIAGNOSIS — Z833 Family history of diabetes mellitus: Secondary | ICD-10-CM | POA: Insufficient documentation

## 2014-03-06 DIAGNOSIS — K089 Disorder of teeth and supporting structures, unspecified: Secondary | ICD-10-CM | POA: Diagnosis not present

## 2014-03-06 DIAGNOSIS — O210 Mild hyperemesis gravidarum: Secondary | ICD-10-CM | POA: Insufficient documentation

## 2014-03-06 DIAGNOSIS — O9989 Other specified diseases and conditions complicating pregnancy, childbirth and the puerperium: Principal | ICD-10-CM

## 2014-03-06 LAB — URINALYSIS, ROUTINE W REFLEX MICROSCOPIC
Glucose, UA: NEGATIVE mg/dL
Hgb urine dipstick: NEGATIVE
Ketones, ur: 40 mg/dL — AB
LEUKOCYTES UA: NEGATIVE
Nitrite: NEGATIVE
Protein, ur: 30 mg/dL — AB
Specific Gravity, Urine: >1.03 — ABNORMAL HIGH (ref 1.005–1.030)
Urobilinogen, UA: 1 mg/dL (ref 0.0–1.0)
pH: 6 (ref 5.0–8.0)

## 2014-03-06 LAB — COMPREHENSIVE METABOLIC PANEL
ALBUMIN: 3.4 g/dL — AB (ref 3.5–5.2)
ALT: 13 U/L (ref 0–35)
ANION GAP: 15 (ref 5–15)
AST: 15 U/L (ref 0–37)
Alkaline Phosphatase: 78 U/L (ref 39–117)
BUN: 9 mg/dL (ref 6–23)
CO2: 24 mEq/L (ref 19–32)
CREATININE: 0.51 mg/dL (ref 0.50–1.10)
Calcium: 10.2 mg/dL (ref 8.4–10.5)
Chloride: 97 mEq/L (ref 96–112)
GFR calc Af Amer: >90 mL/min (ref >90)
GFR calc non Af Amer: >90 mL/min (ref >90)
Glucose, Bld: 92 mg/dL (ref 70–99)
Potassium: 4.3 mEq/L (ref 3.7–5.3)
Sodium: 136 mEq/L — ABNORMAL LOW (ref 137–147)
Total Bilirubin: 0.9 mg/dL (ref 0.3–1.2)
Total Protein: 7.6 g/dL (ref 6.0–8.3)

## 2014-03-06 LAB — CBC
HEMATOCRIT: 34.5 % — AB (ref 36.0–46.0)
HEMOGLOBIN: 12 g/dL (ref 12.0–15.0)
MCH: 31.7 pg (ref 26.0–34.0)
MCHC: 34.8 g/dL (ref 30.0–36.0)
MCV: 91.3 fL (ref 78.0–100.0)
Platelets: 286 10*3/uL (ref 150–400)
RBC: 3.78 MIL/uL — ABNORMAL LOW (ref 3.87–5.11)
RDW: 13.7 % (ref 11.5–15.5)
WBC: 13.4 10*3/uL — AB (ref 4.0–10.5)

## 2014-03-06 LAB — URINE MICROSCOPIC-ADD ON

## 2014-03-06 MED ORDER — METOCLOPRAMIDE HCL 10 MG PO TABS: 10.0000 mg | ORAL_TABLET | Freq: Three times a day (TID) | ORAL | Status: DC

## 2014-03-06 MED ORDER — LACTATED RINGERS IV BOLUS (SEPSIS)
1000.0000 mL | Freq: Once | INTRAVENOUS | Status: AC
  Administered 2014-03-06: 1000 mL via INTRAVENOUS

## 2014-03-06 MED ORDER — METOCLOPRAMIDE HCL 5 MG/ML IJ SOLN
10.0000 mg | Freq: Once | INTRAMUSCULAR | Status: AC
  Administered 2014-03-06: 10 mg via INTRAVENOUS
  Filled 2014-03-06: qty 2

## 2014-03-06 NOTE — MAU Note (Addendum)
See note

## 2014-03-06 NOTE — Discharge Instructions (Signed)
Dental Pain  Toothache is pain in or around a tooth. It may get worse with chewing or with cold or heat.   HOME CARE  · Your dentist may use a numbing medicine during treatment. If so, you may need to avoid eating until the medicine wears off. Ask your dentist about this.  · Only take medicine as told by your dentist or doctor.  · Avoid chewing food near the painful tooth until after all treatment is done. Ask your dentist about this.  GET HELP RIGHT AWAY IF:   · The problem gets worse or new problems appear.  · You have a fever.  · There is redness and puffiness (swelling) of the face, jaw, or neck.  · You cannot open your mouth.  · There is pain in the jaw.  · There is very bad pain that is not helped by medicine.  MAKE SURE YOU:   · Understand these instructions.  · Will watch your condition.  · Will get help right away if you are not doing well or get worse.  Document Released: 01/18/2008 Document Revised: 10/24/2011 Document Reviewed: 01/18/2008  [employer]® Patient Information ©2015 [employer]. This information is not intended to replace advice given to you by your health care provider. Make sure you discuss any questions you have with your health care provider.

## 2014-03-06 NOTE — MAU Note (Signed)
Patient states she started on Amoxicillin yesterday for a tooth infection. Has had some vomiting but has gotten worse with the antibiotic use. Having tooth pain but no abdominal pain or bleeding.

## 2014-03-06 NOTE — MAU Note (Signed)
Pt presents to MAU with c/o vomiting for 3-4 days. She does not believe it is related to antibiotic use because she just started the antibiotics last night. She believes the vomiting is related to the tooth.

## 2014-03-06 NOTE — MAU Provider Note (Signed)

## 2014-03-06 NOTE — MAU Provider Note (Signed)
History     CSN: [id number]  Arrival date and time: 03/06/14 1029   First Provider Initiated Contact with Patient 03/06/14 1159      Chief Complaint  Patient presents with  . Emesis   Emesis  Associated symptoms include dizziness and headaches. Pertinent negatives include no fever.    Pt is a 28 yo G5P2022 at [redacted]w[redacted]d weeks IUP here with report of left sided dental infection diagnosed at the dentist two days ago.  Pt was put on antibiotics.  Here today due to concern of increased vomiting for the past two days.  States unable to hold down anything.  Denies fever, however reports dizziness.  Scheduled to see dental surgeon August 20th.    Past Medical History  Diagnosis Date  . Cancer     cervical  . Extrauterine pregnancy   . Vertigo   . Adenocarcinoma of Bartholin's gland, stage 1   . Vaginal Pap smear, abnormal     2005; had cryo  . Headache(784.0)   . Vertigo     Past Surgical History  Procedure Laterality Date  . Cesarean section    . Dilation and curettage of uterus    . Cryotherapy      Family History  Problem Relation Age of Onset  . Hypertension Mother   . Stroke Mother   . Heart disease Mother   . Epilepsy Mother   . Heart disease Paternal Aunt   . Hypertension Maternal Grandmother   . Diabetes Maternal Grandmother   . Cancer Maternal Grandmother     History  Substance Use Topics  . Smoking status: Former Smoker    Types: Cigarettes  . Smokeless tobacco: Never Used     Comment: social somoker  . Alcohol Use: No     Comment: occasionally    Allergies:  Allergies  Allergen Reactions  . Bactrim [Sulfamethoxazole-Trimethoprim] Other (See Comments)    Constipation   . Oxycodone Itching    Prescriptions prior to admission  Medication Sig Dispense Refill  . acetaminophen (TYLENOL) 500 MG tablet Take 1,000 mg by mouth daily as needed for mild pain or moderate pain.      Marland Kitchen amoxicillin-clavulanate (AUGMENTIN) 875-125 MG per tablet Take 1 tablet by  mouth 2 (two) times daily. Pt started on 03/05/14, to take for 10 days.      . cyclobenzaprine (FLEXERIL) 10 MG tablet Take 0.5-1 tablets (5-10 mg total) by mouth 3 (three) times daily as needed for muscle spasms.  30 tablet  0  . Prenatal Vit-Fe Fumarate-FA (PRENATAL MULTIVITAMIN) TABS tablet Take 1 tablet by mouth daily at 12 noon.      . traMADol (ULTRAM) 50 MG tablet Take 50 mg by mouth every 6 (six) hours as needed for moderate pain.        Review of Systems  Constitutional: Negative for fever.  HENT:       Dental pain.  Gastrointestinal: Positive for nausea and vomiting.  Neurological: Positive for dizziness and headaches.  All other systems reviewed and are negative.  Physical Exam   Blood pressure 93/65, pulse 86, temperature 98.4 F (36.9 C), temperature source Oral, resp. rate 14, height 5\' 1"  (1.549 m), weight 54.795 kg (120 lb 12.8 oz), last menstrual period 10/30/2013, SpO2 100.00%, unknown if currently breastfeeding.  Physical Exam  Constitutional: She is oriented to person, place, and time. She appears well-developed and well-nourished. No distress.  HENT:  Head: Normocephalic.  Left jaw swollen  Neck: Normal range of motion. Neck supple.  Cardiovascular: Normal rate, regular rhythm and normal heart sounds.   Respiratory: Effort normal and breath sounds normal.  GI: Soft. There is no tenderness.  FHR 152  Genitourinary: No bleeding around the vagina.  Neurological: She is alert and oriented to person, place, and time.  Skin: Skin is warm and dry.    MAU Course  Procedures IV LR 1000cc w/ 25 mg Reglan > pt reports improvement in nausea Assessment and Plan  Dental Pain Nausea and Vomiting  Plan: Discharge to home RX Reglan Keep scheduled dental appointments  Sharyon Medicus [facility] 03/06/2014, 12:01 PM

## 2014-03-07 ENCOUNTER — Ambulatory Visit (HOSPITAL_COMMUNITY): Payer: Medicaid Other

## 2014-03-07 ENCOUNTER — Ambulatory Visit (HOSPITAL_COMMUNITY)
Admission: RE | Admit: 2014-03-07 | Discharge: 2014-03-07 | Disposition: A | Discharge status: Home / Self Care | Payer: Medicaid Other | Source: Ambulatory Visit | Attending: Obstetrics and Gynecology | Admitting: Obstetrics and Gynecology

## 2014-03-07 DIAGNOSIS — IMO0002 Reserved for concepts with insufficient information to code with codable children: Secondary | ICD-10-CM

## 2014-03-07 DIAGNOSIS — Z0489 Encounter for examination and observation for other specified reasons: Secondary | ICD-10-CM

## 2014-03-07 DIAGNOSIS — Z1389 Encounter for screening for other disorder: Secondary | ICD-10-CM | POA: Diagnosis present

## 2014-03-07 DIAGNOSIS — Z363 Encounter for antenatal screening for malformations: Secondary | ICD-10-CM | POA: Insufficient documentation

## 2014-03-07 LAB — AFP, QUAD SCREEN
AFP: 82 IU/mL
Curr Gest Age: 18 wks.days
HCG, Total: 25910 m[IU]/mL
INH: 313.6 pg/mL
Interpretation-AFP: NEGATIVE
MOM FOR INH: 1.66
MoM for AFP: 1.7
MoM for hCG: 1.14
Open Spina bifida: NEGATIVE
Osb Risk: 1:3190 {titer}
TRI 18 SCR RISK EST: NEGATIVE
uE3 Mom: 1.19
uE3 Value: 1 ng/mL

## 2014-03-10 ENCOUNTER — Encounter: Payer: Self-pay | Admitting: Obstetrics & Gynecology

## 2014-03-12 ENCOUNTER — Encounter: Payer: Self-pay | Admitting: General Practice

## 2014-03-28 ENCOUNTER — Encounter: Payer: Self-pay | Admitting: General Practice

## 2014-04-02 ENCOUNTER — Ambulatory Visit (INDEPENDENT_AMBULATORY_CARE_PROVIDER_SITE_OTHER): Payer: Medicaid Other | Admitting: Advanced Practice Midwife

## 2014-04-02 ENCOUNTER — Encounter: Payer: Self-pay | Admitting: Obstetrics & Gynecology

## 2014-04-02 VITALS — BP 119/68 | HR 97 | Temp 98.3°F | Wt 128.6 lb

## 2014-04-02 DIAGNOSIS — N750 Cyst of Bartholin's gland: Secondary | ICD-10-CM

## 2014-04-02 DIAGNOSIS — Z3492 Encounter for supervision of normal pregnancy, unspecified, second trimester: Secondary | ICD-10-CM

## 2014-04-02 DIAGNOSIS — Z3480 Encounter for supervision of other normal pregnancy, unspecified trimester: Secondary | ICD-10-CM

## 2014-04-02 LAB — POCT URINALYSIS DIP (DEVICE)
BILIRUBIN URINE: NEGATIVE
Glucose, UA: NEGATIVE mg/dL
HGB URINE DIPSTICK: NEGATIVE
KETONES UR: NEGATIVE mg/dL
Nitrite: NEGATIVE
PH: 7.5 (ref 5.0–8.0)
Protein, ur: NEGATIVE mg/dL
Specific Gravity, Urine: 1.015 (ref 1.005–1.030)
Urobilinogen, UA: 1 mg/dL (ref 0.0–1.0)

## 2014-04-02 NOTE — Progress Notes (Signed)
Thinks she has Bartholin's cyst on left labia. Had many on right in past. This is the first time she's had one on left. 4x6 cm fluctuant, tender, non-erythematous mass left mower labia majora. Denies fever, chills. Reports severe pain. Difficulty standing and walking.  Bartholin Cyst I&D and Word Catheter Placement Enlarged abscess palpated in front of the hymenal ring around 5 o' clock.  Written informed consent was obtained.  Discussed complications and possible outcomes of procedure including recurrence of cyst, scarring leading to infecton, bleeding, dyspareunia, distortion of anatomy.  Patient was examined in the dorsal lithotomy position and mass was identified. Time-out was performed. The area was prepped with Iodine and draped in a sterile manner. Hurricaine spray was applied topically. 1% Lidocaine (3 ml) was then used to infiltrate area on top of the cyst, behind the hymenal ring.  A 7 mm incision was made using a sterile scapel. Upon palpation of the mass, a large amount of bloody purulent drainage was expressed through the incision. A hemostat was used to break up loculations, which resulted in expression of more bloody purulent drainage. The open cyst was then irrigated with normal saline, and a Word catheter was placed. The first Word catheter balloon would not inflate properly so a second Word catheter was obtained. 1.5 ml of sterile water was used to inflate the catheter balloon.  The end of the catheter was tucked into the vagina.  Patient tolerated the procedure well, reported feeling " a lot better." - Recommended Sitz baths bid and Motrin and Percocet was given  prn pain.   She was told to call to be examined if she experiences increasing swelling, pain, vaginal discharge, or fever.  - She was instructed to wear a peripad to absorb discharge, and to maintain pelvic rest while the Word catheter is in place.  -The catheter will be left in place for at least two to four weeks to promote  formation of an epithelialized tract for permanent drainage of glandular secretions.  -Follow up in one week in GYN clinic - She will need an appointment in GYN Clinicin 4-6 weeks from now for removal of word catheter.

## 2014-04-02 NOTE — Patient Instructions (Signed)
Bartholin's Cyst or Abscess Bartholin's glands are small glands located within the folds of skin (labia) along the sides of the lower opening of the vagina (birth canal). A cyst may develop when the duct of the gland becomes blocked. When this happens, fluid that accumulates within the cyst can become infected. This is known as an abscess. The Bartholin gland produces a mucous fluid to lubricate the outside of the vagina during sexual intercourse. SYMPTOMS   Patients with a small cyst may not have any symptoms.  Mild discomfort to severe pain depending on the size of the cyst and if it is infected (abscess).  Pain, redness, and swelling around the lower opening of the vagina.  Painful intercourse.  Pressure in the perineal area.  Swelling of the lips of the vagina (labia).  The cyst or abscess can be on one side or both sides of the vagina. DIAGNOSIS   A large swelling is seen in the lower vagina area by your caregiver.  Painful to touch.  Redness and pain, if it is an abscess. TREATMENT   Sometimes the cyst will go away on its own.  Apply warm wet compresses to the area or take hot sitz baths several times a day.  An incision to drain the cyst or abscess with local anesthesia.  Culture the pus, if it is an abscess.  Antibiotic treatment, if it is an abscess.  Cut open the gland and suture the edges to make the opening of the gland bigger (marsupialization).  Remove the whole gland if the cyst or abscess returns. PREVENTION   Practice good hygiene.  Clean the vaginal area with a mild soap and soft cloth when bathing.  Do not rub hard in the vaginal area when bathing.  Protect the crotch area with a padded cushion if you take long bike rides or ride horses.  Be sure you are well lubricated when you have sexual intercourse. HOME CARE INSTRUCTIONS   If your cyst or abscess was opened, a small piece of gauze, or a drain, may have been placed in the wound to allow  drainage. Do not remove this gauze or drain unless directed by your caregiver.  Wear feminine pads, not tampons, as needed for any drainage or bleeding.  If antibiotics were prescribed, take them exactly as directed. Finish the entire course.  Only take over-the-counter or prescription medicines for pain, discomfort, or fever as directed by your caregiver. SEEK IMMEDIATE MEDICAL CARE IF:   You have an increase in pain, redness, swelling, or drainage.  You have bleeding from the wound which results in the use of more than the number of pads suggested by your caregiver in 24 hours.  You have chills.  You have a fever.  You develop any new problems (symptoms) or aggravation of your existing condition. MAKE SURE YOU:   Understand these instructions.  Will watch your condition.  Will get help right away if you are not doing well or get worse. Document Released: 08/01/2005 Document Revised: 10/24/2011 Document Reviewed: 03/19/2008 [facility] Patient Information 2015 [place], [place]. This information is not intended to replace advice given to you by your health care provider. Make sure you discuss any questions you have with your health care provider.  Second Trimester of Pregnancy The second trimester is from week 13 through week 28, months 4 through 6. The second trimester is often a time when you feel your best. Your body has also adjusted to being pregnant, and you begin to feel better physically.  Usually, morning sickness has lessened or quit completely, you may have more energy, and you may have an increase in appetite. The second trimester is also a time when the fetus is growing rapidly. At the end of the sixth month, the fetus is about 9 inches long and weighs about 1 pounds. You will likely begin to feel the baby move (quickening) between 18 and 20 weeks of the pregnancy. BODY CHANGES Your body goes through many changes during pregnancy. The changes vary from woman to woman.   Your  weight will continue to increase. You will notice your lower abdomen bulging out.  You may begin to get stretch marks on your hips, abdomen, and breasts.  You may develop headaches that can be relieved by medicines approved by your health care provider.  You may urinate more often because the fetus is pressing on your bladder.  You may develop or continue to have heartburn as a result of your pregnancy.  You may develop constipation because certain hormones are causing the muscles that push waste through your intestines to slow down.  You may develop hemorrhoids or swollen, bulging veins (varicose veins).  You may have back pain because of the weight gain and pregnancy hormones relaxing your joints between the bones in your pelvis and as a result of a shift in weight and the muscles that support your balance.  Your breasts will continue to grow and be tender.  Your gums may bleed and may be sensitive to brushing and flossing.  Dark spots or blotches (chloasma, mask of pregnancy) may develop on your face. This will likely fade after the baby is born.  A dark line from your belly button to the pubic area (linea nigra) may appear. This will likely fade after the baby is born.  You may have changes in your hair. These can include thickening of your hair, rapid growth, and changes in texture. Some women also have hair loss during or after pregnancy, or hair that feels dry or thin. Your hair will most likely return to normal after your baby is born. WHAT TO EXPECT AT YOUR PRENATAL VISITS During a routine prenatal visit:  You will be weighed to make sure you and the fetus are growing normally.  Your blood pressure will be taken.  Your abdomen will be measured to track your baby's growth.  The fetal heartbeat will be listened to.  Any test results from the previous visit will be discussed. Your health care provider may ask you:  How you are feeling.  If you are feeling the baby  move.  If you have had any abnormal symptoms, such as leaking fluid, bleeding, severe headaches, or abdominal cramping.  If you have any questions. Other tests that may be performed during your second trimester include:  Blood tests that check for:  Low iron levels (anemia).  Gestational diabetes (between 24 and 28 weeks).  Rh antibodies.  Urine tests to check for infections, diabetes, or protein in the urine.  An ultrasound to confirm the proper growth and development of the baby.  An amniocentesis to check for possible genetic problems.  Fetal screens for spina bifida and Down syndrome. HOME CARE INSTRUCTIONS   Avoid all smoking, herbs, alcohol, and unprescribed drugs. These chemicals affect the formation and growth of the baby.  Follow your health care provider's instructions regarding medicine use. There are medicines that are either safe or unsafe to take during pregnancy.  Exercise only as directed by your health care provider.  Experiencing uterine cramps is a good sign to stop exercising.  Continue to eat regular, healthy meals.  Wear a good support bra for breast tenderness.  Do not use hot tubs, steam rooms, or saunas.  Wear your seat belt at all times when driving.  Avoid raw meat, uncooked cheese, cat litter boxes, and soil used by cats. These carry germs that can cause birth defects in the baby.  Take your prenatal vitamins.  Try taking a stool softener (if your health care provider approves) if you develop constipation. Eat more high-fiber foods, such as fresh vegetables or fruit and whole grains. Drink plenty of fluids to keep your urine clear or pale yellow.  Take warm sitz baths to soothe any pain or discomfort caused by hemorrhoids. Use hemorrhoid cream if your health care provider approves.  If you develop varicose veins, wear support hose. Elevate your feet for 15 minutes, 3-4 times a day. Limit salt in your diet.  Avoid heavy lifting, wear low heel  shoes, and practice good posture.  Rest with your legs elevated if you have leg cramps or low back pain.  Visit your dentist if you have not gone yet during your pregnancy. Use a soft toothbrush to brush your teeth and be gentle when you floss.  A sexual relationship may be continued unless your health care provider directs you otherwise.  Continue to go to all your prenatal visits as directed by your health care provider. SEEK MEDICAL CARE IF:   You have dizziness.  You have mild pelvic cramps, pelvic pressure, or nagging pain in the abdominal area.  You have persistent nausea, vomiting, or diarrhea.  You have a bad smelling vaginal discharge.  You have pain with urination. SEEK IMMEDIATE MEDICAL CARE IF:   You have a fever.  You are leaking fluid from your vagina.  You have spotting or bleeding from your vagina.  You have severe abdominal cramping or pain.  You have rapid weight gain or loss.  You have shortness of breath with chest pain.  You notice sudden or extreme swelling of your face, hands, ankles, feet, or legs.  You have not felt your baby move in over an hour.  You have severe headaches that do not go away with medicine.  You have vision changes. Document Released: 07/26/2001 Document Revised: 08/06/2013 Document Reviewed: 10/02/2012 [facility] Patient Information 2015 [place], [place]. This information is not intended to replace advice given to you by your health care provider. Make sure you discuss any questions you have with your health care provider.

## 2014-04-02 NOTE — Progress Notes (Signed)
Patient reports pelvic pressure; also states she thinks she has another bartholin's cyst because she has been swollen for the past 5 days, also reporting pain and a "knot" as well

## 2014-04-04 ENCOUNTER — Encounter: Payer: Self-pay | Admitting: General Practice

## 2014-04-07 ENCOUNTER — Ambulatory Visit (INDEPENDENT_AMBULATORY_CARE_PROVIDER_SITE_OTHER): Payer: Medicaid Other | Admitting: Obstetrics & Gynecology

## 2014-04-07 ENCOUNTER — Encounter: Payer: Self-pay | Admitting: Obstetrics & Gynecology

## 2014-04-07 VITALS — BP 118/76 | Wt 129.9 lb

## 2014-04-07 DIAGNOSIS — N750 Cyst of Bartholin's gland: Secondary | ICD-10-CM | POA: Insufficient documentation

## 2014-04-07 MED ORDER — AMOXICILLIN-POT CLAVULANATE 875-125 MG PO TABS: 1.0000 | ORAL_TABLET | Freq: Two times a day (BID) | ORAL | Status: DC

## 2014-04-07 NOTE — Progress Notes (Signed)
   Subjective:    Patient ID: Crystal Jenkins, female    DOB: 1986-06-28, 28 y.o.   MRN: [id number]  HPI Comments: 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 She had cyst on left labia which Word catheter was placed 8/19 and fell out on the same day. Area in labia is still sore but has decreased in size though still draining yellow pus.  She takes Tylenol with codeine prn for pain when she needs it.  She reports never taking Augmentin for 10 days bid b/c when she went to [facility] on [address] prescription was not there.       Review of Systems  Constitutional: Negative for fever and chills.  Genitourinary:       +vaginal cyst       Objective:   Physical Exam  Nursing note and vitals reviewed. Constitutional: She is oriented to person, place, and time. Vital signs are normal. She appears well-developed and well-nourished. She is cooperative. No distress.  HENT:  Head: Normocephalic and atraumatic.  Mouth/Throat: No oropharyngeal exudate.  Eyes: Conjunctivae are normal. Right eye exhibits no discharge. Left eye exhibits no discharge. No scleral icterus.  Cardiovascular: Normal rate, regular rhythm, S1 normal, S2 normal and normal heart sounds.   No murmur heard. No lower ext edema   Pulmonary/Chest: Effort normal and breath sounds normal. No respiratory distress. She has no wheezes.  Abdominal: Soft. Bowel sounds are normal. There is no tenderness.  Genitourinary:    Pelvic exam was performed with patient supine.  Neurological: She is alert and oriented to person, place, and time.  Skin: Skin is warm and dry. No rash noted. She is not diaphoretic.  Psychiatric: She has a normal mood and affect. Her speech is normal and behavior is normal. Judgment and thought content normal. Cognition and memory are normal.          Assessment & Plan:  F/u in 4 weeks. Amoxicillin as Rx refilled  Agree with the resident note, I  examined and counseled patient  Woodroe Mode, MD 04/07/2014

## 2014-04-07 NOTE — Assessment & Plan Note (Signed)
Left bartholins cyst still with some fluctuance  Advised to try warm compress and sitz baths  Will Rx again Augmentin 875-125 mg bid x 10 days  F/u in 4 weeks

## 2014-04-07 NOTE — Patient Instructions (Signed)
Amoxicillin; Clavulanic Acid tablets What is this medicine? AMOXICILLIN; CLAVULANIC ACID (a mox i SIL in; KLAV yoo lan ic AS id) is a penicillin antibiotic. It is used to treat certain kinds of bacterial infections. It will not work for colds, flu, or other viral infections. This medicine may be used for other purposes; ask your health care provider or pharmacist if you have questions. COMMON BRAND NAME(S): Augmentin What should I tell my health care provider before I take this medicine? They need to know if you have any of these conditions: -bowel disease, like colitis -kidney disease -liver disease -mononucleosis -an unusual or allergic reaction to amoxicillin, penicillin, cephalosporin, other antibiotics, clavulanic acid, other medicines, foods, dyes, or preservatives -pregnant or trying to get pregnant -breast-feeding How should I use this medicine? Take this medicine by mouth with a full glass of water. Follow the directions on the prescription label. Take at the start of a meal. Do not crush or chew. If the tablet has a score line, you may cut it in half at the score line for easier swallowing. Take your medicine at regular intervals. Do not take your medicine more often than directed. Take all of your medicine as directed even if you think you are better. Do not skip doses or stop your medicine early. Talk to your pediatrician regarding the use of this medicine in children. Special care may be needed. Overdosage: If you think you have taken too much of this medicine contact a poison control center or emergency room at once. NOTE: This medicine is only for you. Do not share this medicine with others. What if I miss a dose? If you miss a dose, take it as soon as you can. If it is almost time for your next dose, take only that dose. Do not take double or extra doses. What may interact with this medicine? -allopurinol -anticoagulants -birth control pills -methotrexate -probenecid This  list may not describe all possible interactions. Give your health care provider a list of all the medicines, herbs, non-prescription drugs, or dietary supplements you use. Also tell them if you smoke, drink alcohol, or use illegal drugs. Some items may interact with your medicine. What should I watch for while using this medicine? Tell your doctor or health care professional if your symptoms do not improve. Do not treat diarrhea with over the counter products. Contact your doctor if you have diarrhea that lasts more than 2 days or if it is severe and watery. If you have diabetes, you may get a false-positive result for sugar in your urine. Check with your doctor or health care professional. Birth control pills may not work properly while you are taking this medicine. Talk to your doctor about using an extra method of birth control. What side effects may I notice from receiving this medicine? Side effects that you should report to your doctor or health care professional as soon as possible: -allergic reactions like skin rash, itching or hives, swelling of the face, lips, or tongue -breathing problems -dark urine -fever or chills, sore throat -redness, blistering, peeling or loosening of the skin, including inside the mouth -seizures -trouble passing urine or change in the amount of urine -unusual bleeding, bruising -unusually weak or tired -white patches or sores in the mouth or throat Side effects that usually do not require medical attention (report to your doctor or health care professional if they continue or are bothersome): -diarrhea -dizziness -headache -nausea, vomiting -stomach upset -vaginal or anal irritation This list may   not describe all possible side effects. Call your doctor for medical advice about side effects. You may report side effects to FDA at 1-800-FDA-1088. Where should I keep my medicine? Keep out of the reach of children. Store at room temperature below 25 degrees  C (77 degrees F). Keep container tightly closed. Throw away any unused medicine after the expiration date. NOTE: This sheet is a summary. It may not cover all possible information. If you have questions about this medicine, talk to your doctor, pharmacist, or health care provider.  2015, [employer] Standard. (2007-10-25 12:04:30)  Bartholin's Cyst or Abscess Bartholin's glands are small glands located within the folds of skin (labia) along the sides of the lower opening of the vagina (birth canal). A cyst may develop when the duct of the gland becomes blocked. When this happens, fluid that accumulates within the cyst can become infected. This is known as an abscess. The Bartholin gland produces a mucous fluid to lubricate the outside of the vagina during sexual intercourse. SYMPTOMS   Patients with a small cyst may not have any symptoms.  Mild discomfort to severe pain depending on the size of the cyst and if it is infected (abscess).  Pain, redness, and swelling around the lower opening of the vagina.  Painful intercourse.  Pressure in the perineal area.  Swelling of the lips of the vagina (labia).  The cyst or abscess can be on one side or both sides of the vagina. DIAGNOSIS   A large swelling is seen in the lower vagina area by your caregiver.  Painful to touch.  Redness and pain, if it is an abscess. TREATMENT   Sometimes the cyst will go away on its own.  Apply warm wet compresses to the area or take hot sitz baths several times a day.  An incision to drain the cyst or abscess with local anesthesia.  Culture the pus, if it is an abscess.  Antibiotic treatment, if it is an abscess.  Cut open the gland and suture the edges to make the opening of the gland bigger (marsupialization).  Remove the whole gland if the cyst or abscess returns. PREVENTION   Practice good hygiene.  Clean the vaginal area with a mild soap and soft cloth when bathing.  Do not rub hard in  the vaginal area when bathing.  Protect the crotch area with a padded cushion if you take long bike rides or ride horses.  Be sure you are well lubricated when you have sexual intercourse. HOME CARE INSTRUCTIONS   If your cyst or abscess was opened, a small piece of gauze, or a drain, may have been placed in the wound to allow drainage. Do not remove this gauze or drain unless directed by your caregiver.  Wear feminine pads, not tampons, as needed for any drainage or bleeding.  If antibiotics were prescribed, take them exactly as directed. Finish the entire course.  Only take over-the-counter or prescription medicines for pain, discomfort, or fever as directed by your caregiver. SEEK IMMEDIATE MEDICAL CARE IF:   You have an increase in pain, redness, swelling, or drainage.  You have bleeding from the wound which results in the use of more than the number of pads suggested by your caregiver in 24 hours.  You have chills.  You have a fever.  You develop any new problems (symptoms) or aggravation of your existing condition. MAKE SURE YOU:   Understand these instructions.  Will watch your condition.  Will get help right away if  you are not doing well or get worse. Document Released: 08/01/2005 Document Revised: 10/24/2011 Document Reviewed: 03/19/2008 [facility] Patient Information 2015 [place], [place]. This information is not intended to replace advice given to you by your health care provider. Make sure you discuss any questions you have with your health care provider.

## 2014-04-23 ENCOUNTER — Ambulatory Visit (INDEPENDENT_AMBULATORY_CARE_PROVIDER_SITE_OTHER): Payer: Medicaid Other | Admitting: Physician Assistant

## 2014-04-23 VITALS — BP 107/61 | HR 87 | Wt 135.2 lb

## 2014-04-23 DIAGNOSIS — Z23 Encounter for immunization: Secondary | ICD-10-CM

## 2014-04-23 DIAGNOSIS — Z3480 Encounter for supervision of other normal pregnancy, unspecified trimester: Secondary | ICD-10-CM

## 2014-04-23 DIAGNOSIS — Z3492 Encounter for supervision of normal pregnancy, unspecified, second trimester: Secondary | ICD-10-CM

## 2014-04-23 LAB — POCT URINALYSIS DIP (DEVICE)
Bilirubin Urine: NEGATIVE
Glucose, UA: NEGATIVE mg/dL
Hgb urine dipstick: NEGATIVE
KETONES UR: NEGATIVE mg/dL
Nitrite: NEGATIVE
PH: 7 (ref 5.0–8.0)
PROTEIN: NEGATIVE mg/dL
SPECIFIC GRAVITY, URINE: 1.02 (ref 1.005–1.030)
Urobilinogen, UA: 0.2 mg/dL (ref 0.0–1.0)

## 2014-04-23 NOTE — Patient Instructions (Signed)
Breastfeeding Deciding to breastfeed is one of the best choices you can make for you and your baby. A change in hormones during pregnancy causes your breast tissue to grow and increases the number and size of your milk ducts. These hormones also allow proteins, sugars, and fats from your blood supply to make breast milk in your milk-producing glands. Hormones prevent breast milk from being released before your baby is born as well as prompt milk flow after birth. Once breastfeeding has begun, thoughts of your baby, as well as his or her sucking or crying, can stimulate the release of milk from your milk-producing glands.  BENEFITS OF BREASTFEEDING For Your Baby  Your first milk (colostrum) helps your baby's digestive system function better.   There are antibodies in your milk that help your baby fight off infections.   Your baby has a lower incidence of asthma, allergies, and sudden infant death syndrome.   The nutrients in breast milk are better for your baby than infant formulas and are designed uniquely for your baby's needs.   Breast milk improves your baby's brain development.   Your baby is less likely to develop other conditions, such as childhood obesity, asthma, or type 2 diabetes mellitus.  For You   Breastfeeding helps to create a very special bond between you and your baby.   Breastfeeding is convenient. Breast milk is always available at the correct temperature and costs nothing.   Breastfeeding helps to burn calories and helps you lose the weight gained during pregnancy.   Breastfeeding makes your uterus contract to its prepregnancy size faster and slows bleeding (lochia) after you give birth.   Breastfeeding helps to lower your risk of developing type 2 diabetes mellitus, osteoporosis, and breast or ovarian cancer later in life. SIGNS THAT YOUR BABY IS HUNGRY Early Signs of Hunger  Increased alertness or activity.  Stretching.  Movement of the head from  side to side.  Movement of the head and opening of the mouth when the corner of the mouth or cheek is stroked (rooting).  Increased sucking sounds, smacking lips, cooing, sighing, or squeaking.  Hand-to-mouth movements.  Increased sucking of fingers or hands. Late Signs of Hunger  Fussing.  Intermittent crying. Extreme Signs of Hunger Signs of extreme hunger will require calming and consoling before your baby will be able to breastfeed successfully. Do not wait for the following signs of extreme hunger to occur before you initiate breastfeeding:   Restlessness.  A loud, strong cry.   Screaming. BREASTFEEDING BASICS Breastfeeding Initiation  Find a comfortable place to sit or lie down, with your neck and back well supported.  Place a pillow or rolled up blanket under your baby to bring him or her to the level of your breast (if you are seated). Nursing pillows are specially designed to help support your arms and your baby while you breastfeed.  Make sure that your baby's abdomen is facing your abdomen.   Gently massage your breast. With your fingertips, massage from your chest wall toward your nipple in a circular motion. This encourages milk flow. You may need to continue this action during the feeding if your milk flows slowly.  Support your breast with 4 fingers underneath and your thumb above your nipple. Make sure your fingers are well away from your nipple and your baby's mouth.   Stroke your baby's lips gently with your finger or nipple.   When your baby's mouth is open wide enough, quickly bring your baby to your  breast, placing your entire nipple and as much of the colored area around your nipple (areola) as possible into your baby's mouth.   More areola should be visible above your baby's upper lip than below the lower lip.   Your baby's tongue should be between his or her lower gum and your breast.   Ensure that your baby's mouth is correctly positioned  around your nipple (latched). Your baby's lips should create a seal on your breast and be turned out (everted).  It is common for your baby to suck about 2-3 minutes in order to start the flow of breast milk. Latching Teaching your baby how to latch on to your breast properly is very important. An improper latch can cause nipple pain and decreased milk supply for you and poor weight gain in your baby. Also, if your baby is not latched onto your nipple properly, he or she may swallow some air during feeding. This can make your baby fussy. Burping your baby when you switch breasts during the feeding can help to get rid of the air. However, teaching your baby to latch on properly is still the best way to prevent fussiness from swallowing air while breastfeeding. Signs that your baby has successfully latched on to your nipple:    Silent tugging or silent sucking, without causing you pain.   Swallowing heard between every 3-4 sucks.    Muscle movement above and in front of his or her ears while sucking.  Signs that your baby has not successfully latched on to nipple:   Sucking sounds or smacking sounds from your baby while breastfeeding.  Nipple pain. If you think your baby has not latched on correctly, slip your finger into the corner of your baby's mouth to break the suction and place it between your baby's gums. Attempt breastfeeding initiation again. Signs of Successful Breastfeeding Signs from your baby:   A gradual decrease in the number of sucks or complete cessation of sucking.   Falling asleep.   Relaxation of his or her body.   Retention of a small amount of milk in his or her mouth.   Letting go of your breast by himself or herself. Signs from you:  Breasts that have increased in firmness, weight, and size 1-3 hours after feeding.   Breasts that are softer immediately after breastfeeding.  Increased milk volume, as well as a change in milk consistency and color by  the fifth day of breastfeeding.   Nipples that are not sore, cracked, or bleeding. Signs That Your [employer] is Getting Enough Milk  Wetting at least 3 diapers in a 24-hour period. The urine should be clear and pale yellow by age 11 days.  At least 3 stools in a 24-hour period by age 11 days. The stool should be soft and yellow.  At least 3 stools in a 24-hour period by age 18 days. The stool should be seedy and yellow.  No loss of weight greater than 10% of birth weight during the first 55 days of age.  Average weight gain of 4-7 ounces (113-198 g) per week after age 36 days.  Consistent daily weight gain by age 9 days, without weight loss after the age of 2 weeks. After a feeding, your baby may spit up a small amount. This is common. BREASTFEEDING FREQUENCY AND DURATION Frequent feeding will help you make more milk and can prevent sore nipples and breast engorgement. Breastfeed when you feel the need to reduce the fullness of your breasts  or when your baby shows signs of hunger. This is called "breastfeeding on demand." Avoid introducing a pacifier to your baby while you are working to establish breastfeeding (the first 4-6 weeks after your baby is born). After this time you may choose to use a pacifier. Research has shown that pacifier use during the first year of a baby's life decreases the risk of sudden infant death syndrome (SIDS). Allow your baby to feed on each breast as long as he or she wants. Breastfeed until your baby is finished feeding. When your baby unlatches or falls asleep while feeding from the first breast, offer the second breast. Because newborns are often sleepy in the first few weeks of life, you may need to awaken your baby to get him or her to feed. Breastfeeding times will vary from baby to baby. However, the following rules can serve as a guide to help you ensure that your baby is properly fed:  Newborns (babies 5 weeks of age or younger) may breastfeed every 1-3  hours.  Newborns should not go longer than 3 hours during the day or 5 hours during the night without breastfeeding.  You should breastfeed your baby a minimum of 8 times in a 24-hour period until you begin to introduce solid foods to your baby at around 54 months of age. BREAST MILK PUMPING Pumping and storing breast milk allows you to ensure that your baby is exclusively fed your breast milk, even at times when you are unable to breastfeed. This is especially important if you are going back to work while you are still breastfeeding or when you are not able to be present during feedings. Your lactation consultant can give you guidelines on how long it is safe to store breast milk.  A breast pump is a machine that allows you to pump milk from your breast into a sterile bottle. The pumped breast milk can then be stored in a refrigerator or freezer. Some breast pumps are operated by hand, while others use electricity. Ask your lactation consultant which type will work best for you. Breast pumps can be purchased, but some hospitals and breastfeeding support groups lease breast pumps on a monthly basis. A lactation consultant can teach you how to hand express breast milk, if you prefer not to use a pump.  CARING FOR YOUR BREASTS WHILE YOU BREASTFEED Nipples can become dry, cracked, and sore while breastfeeding. The following recommendations can help keep your breasts moisturized and healthy:  Avoid using soap on your nipples.   Wear a supportive bra. Although not required, special nursing bras and tank tops are designed to allow access to your breasts for breastfeeding without taking off your entire bra or top. Avoid wearing underwire-style bras or extremely tight bras.  Air dry your nipples for 3-98minutes after each feeding.   Use only cotton bra pads to absorb leaked breast milk. Leaking of breast milk between feedings is normal.   Use lanolin on your nipples after breastfeeding. Lanolin helps to  maintain your skin's normal moisture barrier. If you use pure lanolin, you do not need to wash it off before feeding your baby again. Pure lanolin is not toxic to your baby. You may also hand express a few drops of breast milk and gently massage that milk into your nipples and allow the milk to air dry. In the first few weeks after giving birth, some women experience extremely full breasts (engorgement). Engorgement can make your breasts feel heavy, warm, and tender to the  touch. Engorgement peaks within 3-5 days after you give birth. The following recommendations can help ease engorgement:  Completely empty your breasts while breastfeeding or pumping. You may want to start by applying warm, moist heat (in the shower or with warm water-soaked hand towels) just before feeding or pumping. This increases circulation and helps the milk flow. If your baby does not completely empty your breasts while breastfeeding, pump any extra milk after he or she is finished.  Wear a snug bra (nursing or regular) or tank top for 1-2 days to signal your body to slightly decrease milk production.  Apply ice packs to your breasts, unless this is too uncomfortable for you.  Make sure that your baby is latched on and positioned properly while breastfeeding. If engorgement persists after 48 hours of following these recommendations, contact your health care provider or a lactation consultant. OVERALL HEALTH CARE RECOMMENDATIONS WHILE BREASTFEEDING  Eat healthy foods. Alternate between meals and snacks, eating 3 of each per day. Because what you eat affects your breast milk, some of the foods may make your baby more irritable than usual. Avoid eating these foods if you are sure that they are negatively affecting your baby.  Drink milk, fruit juice, and water to satisfy your thirst (about 10 glasses a day).   Rest often, relax, and continue to take your prenatal vitamins to prevent fatigue, stress, and anemia.  Continue  breast self-awareness checks.  Avoid chewing and smoking tobacco.  Avoid alcohol and drug use. Some medicines that may be harmful to your baby can pass through breast milk. It is important to ask your health care provider before taking any medicine, including all over-the-counter and prescription medicine as well as vitamin and herbal supplements. It is possible to become pregnant while breastfeeding. If birth control is desired, ask your health care provider about options that will be safe for your baby. SEEK MEDICAL CARE IF:   You feel like you want to stop breastfeeding or have become frustrated with breastfeeding.  You have painful breasts or nipples.  Your nipples are cracked or bleeding.  Your breasts are red, tender, or warm.  You have a swollen area on either breast.  You have a fever or chills.  You have nausea or vomiting.  You have drainage other than breast milk from your nipples.  Your breasts do not become full before feedings by the fifth day after you give birth.  You feel sad and depressed.  Your baby is too sleepy to eat well.  Your baby is having trouble sleeping.   Your baby is wetting less than 3 diapers in a 24-hour period.  Your baby has less than 3 stools in a 24-hour period.  Your baby's skin or the white part of his or her eyes becomes yellow.   Your baby is not gaining weight by 5 days of age. SEEK IMMEDIATE MEDICAL CARE IF:   Your baby is overly tired (lethargic) and does not want to wake up and feed.  Your baby develops an unexplained fever. Document Released: 08/01/2005 Document Revised: 08/06/2013 Document Reviewed: 01/23/2013 [employer] Patient Information 2015 [employer]. This information is not intended to replace advice given to you by your health care provider. Make sure you discuss any questions you have with your health care provider.  

## 2014-04-23 NOTE — Progress Notes (Signed)
25 weeks, stable Large leuks on U/A: urine cx pending- denies urinary symptoms Good FM, no bleeding or LOF RTC 3 weeks

## 2014-04-23 NOTE — Progress Notes (Signed)
Pt declines flu shot.  

## 2014-04-24 ENCOUNTER — Encounter (HOSPITAL_COMMUNITY): Payer: Self-pay | Admitting: *Deleted

## 2014-04-24 ENCOUNTER — Inpatient Hospital Stay (HOSPITAL_COMMUNITY)
Admission: AD | Admit: 2014-04-24 | Discharge: 2014-04-24 | Disposition: A | Discharge status: Home / Self Care | Payer: Medicaid Other | Source: Ambulatory Visit | Attending: Obstetrics & Gynecology | Admitting: Obstetrics & Gynecology

## 2014-04-24 DIAGNOSIS — M545 Low back pain, unspecified: Secondary | ICD-10-CM | POA: Diagnosis not present

## 2014-04-24 DIAGNOSIS — O99891 Other specified diseases and conditions complicating pregnancy: Secondary | ICD-10-CM | POA: Diagnosis not present

## 2014-04-24 DIAGNOSIS — O9989 Other specified diseases and conditions complicating pregnancy, childbirth and the puerperium: Principal | ICD-10-CM

## 2014-04-24 DIAGNOSIS — O26892 Other specified pregnancy related conditions, second trimester: Secondary | ICD-10-CM

## 2014-04-24 DIAGNOSIS — M549 Dorsalgia, unspecified: Secondary | ICD-10-CM | POA: Diagnosis present

## 2014-04-24 HISTORY — DX: Methicillin resistant Staphylococcus aureus infection, unspecified site: A49.02

## 2014-04-24 HISTORY — DX: Unspecified infectious disease: B99.9

## 2014-04-24 LAB — URINALYSIS, ROUTINE W REFLEX MICROSCOPIC
Bilirubin Urine: NEGATIVE
Glucose, UA: NEGATIVE mg/dL
Hgb urine dipstick: NEGATIVE
KETONES UR: NEGATIVE mg/dL
Leukocytes, UA: NEGATIVE
NITRITE: NEGATIVE
PH: 7.5 (ref 5.0–8.0)
Protein, ur: NEGATIVE mg/dL
SPECIFIC GRAVITY, URINE: 1.015 (ref 1.005–1.030)
Urobilinogen, UA: 0.2 mg/dL (ref 0.0–1.0)

## 2014-04-24 MED ORDER — IBUPROFEN 600 MG PO TABS
600.0000 mg | ORAL_TABLET | Freq: Once | ORAL | Status: AC
  Administered 2014-04-24: 600 mg via ORAL
  Filled 2014-04-24: qty 1

## 2014-04-24 MED ORDER — IBUPROFEN 600 MG PO TABS: 600.0000 mg | ORAL_TABLET | Freq: Four times a day (QID) | ORAL | Status: DC | PRN

## 2014-04-24 NOTE — Discharge Instructions (Signed)

## 2014-04-24 NOTE — MAU Note (Signed)
Patient states she has been having low back pain since last night. Feels like contractions every 10 minutes. Denies bleeding or leaking and reports feeling fetal movement.

## 2014-04-24 NOTE — MAU Provider Note (Signed)
First Provider Initiated Contact with Patient 04/24/14 1029      Chief Complaint:  Back Pain   Crystal Jenkins is  28 y.o. [id number] at [redacted]w[redacted]d presents complaining of Back Pain Patient woke up with lower back pain around 1-2am this morning. Was occuring every109minutes, now irmproving. She notes the pain does not occur why lying or sitting, only while standing. It does not occur each time with standing. Also notes that she's been prescribed Flexeril in the past for back pain which did now help, it only made her sleepy.  No dysuria, urinary urgency, or urinary frequency. Denies fever/chills, or vaginal discharge/prutius.   Additionally, the patient noted that earlier she felt stomach tightening but this has since resolved. No N/V.  No LOF or vaginal bleeding. Good fetal movement.   Obstetrical/Gynecological History: OB History   Grav Para Term Preterm Abortions TAB SAB Ect Mult Living   5 2 2  0 2  1 1  2      Past Medical History: Past Medical History  Diagnosis Date  . Cancer     cervical  . Extrauterine pregnancy   . Vertigo   . Adenocarcinoma of Bartholin's gland, stage 1   . Vaginal Pap smear, abnormal     2005; had cryo  . Headache(784.0)   . Vertigo   . Infection     UTI  . MRSA (methicillin resistant Staphylococcus aureus)     Past Surgical History: Past Surgical History  Procedure Laterality Date  . Cesarean section    . Dilation and curettage of uterus    . Cryotherapy    . Laparotomy      removal of ectopic preg    Family History: Family History  Problem Relation Age of Onset  . Hypertension Mother   . Stroke Mother   . Heart disease Mother   . Epilepsy Mother   . Heart disease Paternal Aunt   . Hypertension Maternal Grandmother   . Diabetes Maternal Grandmother   . Cancer Maternal Grandmother     Social History: History  Substance Use Topics  . Smoking status: Former Smoker    Types: Cigarettes  . Smokeless tobacco: Never Used     Comment: social  somoker, quit with +preg  . Alcohol Use: No     Comment: occasionally    Allergies:  Allergies  Allergen Reactions  . Bactrim [Sulfamethoxazole-Trimethoprim] Other (See Comments)    Constipation   . Oxycodone Itching    Meds:  Prescriptions prior to admission  Medication Sig Dispense Refill  . cyclobenzaprine (FLEXERIL) 10 MG tablet Take 0.5-1 tablets (5-10 mg total) by mouth 3 (three) times daily as needed for muscle spasms.  30 tablet  0  . Prenatal Vit-Fe Fumarate-FA (PRENATAL MULTIVITAMIN) TABS tablet Take 1 tablet by mouth daily at 12 noon.        Review of Systems -   Review of Systems  Constitutional: Negative for fever, chills, weight loss, malaise/fatigue and diaphoresis.  HENT: Negative for hearing loss, ear pain, nosebleeds, congestion, sore throat, neck pain, tinnitus and ear discharge.   Eyes: Negative for blurred vision, double vision, photophobia, pain, discharge and redness.  Respiratory: Negative for cough, hemoptysis, sputum production, shortness of breath, wheezing and stridor.   Cardiovascular: Negative for chest pain, palpitations, orthopnea,  leg swelling  Gastrointestinal: Negative for abdominal pain heartburn, nausea, vomiting, diarrhea, constipation, blood in stool Genitourinary: Negative for dysuria, urgency, frequency, hematuria and flank pain.  Musculoskeletal: Negative for myalgias, back pain, joint pain and falls.  Skin: Negative for itching and rash.  Neurological: Negative for dizziness, tingling, tremors, sensory change, speech change, focal weakness, seizures, loss of consciousness, weakness and headaches.  Endo/Heme/Allergies: Negative for environmental allergies and polydipsia. Does not bruise/bleed easily.  Psychiatric/Behavioral: Negative for depression, suicidal ideas, hallucinations, memory loss and substance abuse. The patient is not nervous/anxious and does not have insomnia.      Physical Exam  Blood pressure 99/63, pulse 84,  temperature 98.7 F (37.1 C), temperature source Oral, resp. rate 18, height 5\' 2"  (1.575 m), weight 62.143 kg (137 lb), last menstrual period 10/30/2013, unknown if currently breastfeeding. GENERAL: Well-developed, well-nourished female in no acute distress.  LUNGS: Clear to auscultation bilaterally.  HEART: Regular rate and rhythm. ABDOMEN: Soft, nontender, nondistended, gravid.  BACK: No CVAT. No pain over the spinal processes. Tenderness to palpation over the lumbar paraspinal muscles b/l. EXTREMITIES: Nontender, no edema, 2+ distal pulses. DTR's 2+ CERVICAL EXAM: Dilatation 0cm    FHT:  Baseline rate 150 bpm   Variability moderate  Accelerations none (appropriate) Decelerations none Contractions: none   Labs: Results for orders placed during the hospital encounter of 04/24/14 (from the past 24 hour(s))  URINALYSIS, ROUTINE W REFLEX MICROSCOPIC   Collection Time    04/24/14 10:00 AM      Result Value Ref Range   Color, Urine YELLOW  YELLOW   APPearance CLEAR  CLEAR   Specific Gravity, Urine 1.015  1.005 - 1.030   pH 7.5  5.0 - 8.0   Glucose, UA NEGATIVE  NEGATIVE mg/dL   Hgb urine dipstick NEGATIVE  NEGATIVE   Bilirubin Urine NEGATIVE  NEGATIVE   Ketones, ur NEGATIVE  NEGATIVE mg/dL   Protein, ur NEGATIVE  NEGATIVE mg/dL   Urobilinogen, UA 0.2  0.0 - 1.0 mg/dL   Nitrite NEGATIVE  NEGATIVE   Leukocytes, UA NEGATIVE  NEGATIVE   Imaging Studies:  No results found.  Assessment: Crystal Jenkins is  28 y.o. [id number] at [redacted]w[redacted]d presents with lower back pain since this AM.  She was given Ibuprofen 600mg  once for pain.  Archie Patten 9/10/201512:19 PM  MDM Care assumed from L&D for discharge of patient. Patient reports resolution of back pain with Ibuprofen. She states that this has been present throughout the recent stages of pregnancy  Plan: Discharge home Rx for Ibuprofen given to patient. Advised to discontinue use in the third trimester Discussed abdominal binder, warm  bath/shower, heat and moderation of activity Patient advised to discuss with provider in [facility] if back pain becomes an issues with her performing her work duties Patient may return to MAU as needed or if her condition were to change or worsen  Luvenia Redden, PA-C  04/24/2014 12:49 PM

## 2014-04-24 NOTE — MAU Note (Signed)
Back pain started last night, feels like labor.  Denies hx of PTL

## 2014-04-25 LAB — URINE CULTURE

## 2014-05-14 ENCOUNTER — Encounter: Payer: Self-pay | Admitting: Physician Assistant

## 2014-05-14 ENCOUNTER — Ambulatory Visit (INDEPENDENT_AMBULATORY_CARE_PROVIDER_SITE_OTHER): Payer: Medicaid Other | Admitting: Physician Assistant

## 2014-05-14 VITALS — BP 105/60 | HR 88 | Wt 138.2 lb

## 2014-05-14 DIAGNOSIS — O34219 Maternal care for unspecified type scar from previous cesarean delivery: Secondary | ICD-10-CM

## 2014-05-14 DIAGNOSIS — Z3480 Encounter for supervision of other normal pregnancy, unspecified trimester: Secondary | ICD-10-CM

## 2014-05-14 DIAGNOSIS — Z3493 Encounter for supervision of normal pregnancy, unspecified, third trimester: Secondary | ICD-10-CM

## 2014-05-14 DIAGNOSIS — Z23 Encounter for immunization: Secondary | ICD-10-CM

## 2014-05-14 LAB — POCT URINALYSIS DIP (DEVICE)
Bilirubin Urine: NEGATIVE
Glucose, UA: NEGATIVE mg/dL
Ketones, ur: NEGATIVE mg/dL
NITRITE: NEGATIVE
PH: 6.5 (ref 5.0–8.0)
PROTEIN: NEGATIVE mg/dL
Specific Gravity, Urine: 1.02 (ref 1.005–1.030)
UROBILINOGEN UA: 1 mg/dL (ref 0.0–1.0)

## 2014-05-14 MED ORDER — TETANUS-DIPHTH-ACELL PERTUSSIS 5-2.5-18.5 LF-MCG/0.5 IM SUSP
0.5000 mL | Freq: Once | INTRAMUSCULAR | Status: DC
Start: 2014-05-14 — End: 2014-08-02

## 2014-05-14 NOTE — Patient Instructions (Signed)
Breastfeeding Deciding to breastfeed is one of the best choices you can make for you and your baby. A change in hormones during pregnancy causes your breast tissue to grow and increases the number and size of your milk ducts. These hormones also allow proteins, sugars, and fats from your blood supply to make breast milk in your milk-producing glands. Hormones prevent breast milk from being released before your baby is born as well as prompt milk flow after birth. Once breastfeeding has begun, thoughts of your baby, as well as his or her sucking or crying, can stimulate the release of milk from your milk-producing glands.  BENEFITS OF BREASTFEEDING For Your Baby  Your first milk (colostrum) helps your baby's digestive system function better.   There are antibodies in your milk that help your baby fight off infections.   Your baby has a lower incidence of asthma, allergies, and sudden infant death syndrome.   The nutrients in breast milk are better for your baby than infant formulas and are designed uniquely for your baby's needs.   Breast milk improves your baby's brain development.   Your baby is less likely to develop other conditions, such as childhood obesity, asthma, or type 2 diabetes mellitus.  For You   Breastfeeding helps to create a very special bond between you and your baby.   Breastfeeding is convenient. Breast milk is always available at the correct temperature and costs nothing.   Breastfeeding helps to burn calories and helps you lose the weight gained during pregnancy.   Breastfeeding makes your uterus contract to its prepregnancy size faster and slows bleeding (lochia) after you give birth.   Breastfeeding helps to lower your risk of developing type 2 diabetes mellitus, osteoporosis, and breast or ovarian cancer later in life. SIGNS THAT YOUR BABY IS HUNGRY Early Signs of Hunger  Increased alertness or activity.  Stretching.  Movement of the head from  side to side.  Movement of the head and opening of the mouth when the corner of the mouth or cheek is stroked (rooting).  Increased sucking sounds, smacking lips, cooing, sighing, or squeaking.  Hand-to-mouth movements.  Increased sucking of fingers or hands. Late Signs of Hunger  Fussing.  Intermittent crying. Extreme Signs of Hunger Signs of extreme hunger will require calming and consoling before your baby will be able to breastfeed successfully. Do not wait for the following signs of extreme hunger to occur before you initiate breastfeeding:   Restlessness.  A loud, strong cry.   Screaming. BREASTFEEDING BASICS Breastfeeding Initiation  Find a comfortable place to sit or lie down, with your neck and back well supported.  Place a pillow or rolled up blanket under your baby to bring him or her to the level of your breast (if you are seated). Nursing pillows are specially designed to help support your arms and your baby while you breastfeed.  Make sure that your baby's abdomen is facing your abdomen.   Gently massage your breast. With your fingertips, massage from your chest wall toward your nipple in a circular motion. This encourages milk flow. You may need to continue this action during the feeding if your milk flows slowly.  Support your breast with 4 fingers underneath and your thumb above your nipple. Make sure your fingers are well away from your nipple and your baby's mouth.   Stroke your baby's lips gently with your finger or nipple.   When your baby's mouth is open wide enough, quickly bring your baby to your  breast, placing your entire nipple and as much of the colored area around your nipple (areola) as possible into your baby's mouth.   More areola should be visible above your baby's upper lip than below the lower lip.   Your baby's tongue should be between his or her lower gum and your breast.   Ensure that your baby's mouth is correctly positioned  around your nipple (latched). Your baby's lips should create a seal on your breast and be turned out (everted).  It is common for your baby to suck about 2-3 minutes in order to start the flow of breast milk. Latching Teaching your baby how to latch on to your breast properly is very important. An improper latch can cause nipple pain and decreased milk supply for you and poor weight gain in your baby. Also, if your baby is not latched onto your nipple properly, he or she may swallow some air during feeding. This can make your baby fussy. Burping your baby when you switch breasts during the feeding can help to get rid of the air. However, teaching your baby to latch on properly is still the best way to prevent fussiness from swallowing air while breastfeeding. Signs that your baby has successfully latched on to your nipple:    Silent tugging or silent sucking, without causing you pain.   Swallowing heard between every 3-4 sucks.    Muscle movement above and in front of his or her ears while sucking.  Signs that your baby has not successfully latched on to nipple:   Sucking sounds or smacking sounds from your baby while breastfeeding.  Nipple pain. If you think your baby has not latched on correctly, slip your finger into the corner of your baby's mouth to break the suction and place it between your baby's gums. Attempt breastfeeding initiation again. Signs of Successful Breastfeeding Signs from your baby:   A gradual decrease in the number of sucks or complete cessation of sucking.   Falling asleep.   Relaxation of his or her body.   Retention of a small amount of milk in his or her mouth.   Letting go of your breast by himself or herself. Signs from you:  Breasts that have increased in firmness, weight, and size 1-3 hours after feeding.   Breasts that are softer immediately after breastfeeding.  Increased milk volume, as well as a change in milk consistency and color by  the fifth day of breastfeeding.   Nipples that are not sore, cracked, or bleeding. Signs That Your [employer] is Getting Enough Milk  Wetting at least 3 diapers in a 24-hour period. The urine should be clear and pale yellow by age 11 days.  At least 3 stools in a 24-hour period by age 11 days. The stool should be soft and yellow.  At least 3 stools in a 24-hour period by age 18 days. The stool should be seedy and yellow.  No loss of weight greater than 10% of birth weight during the first 55 days of age.  Average weight gain of 4-7 ounces (113-198 g) per week after age 36 days.  Consistent daily weight gain by age 9 days, without weight loss after the age of 2 weeks. After a feeding, your baby may spit up a small amount. This is common. BREASTFEEDING FREQUENCY AND DURATION Frequent feeding will help you make more milk and can prevent sore nipples and breast engorgement. Breastfeed when you feel the need to reduce the fullness of your breasts  or when your baby shows signs of hunger. This is called "breastfeeding on demand." Avoid introducing a pacifier to your baby while you are working to establish breastfeeding (the first 4-6 weeks after your baby is born). After this time you may choose to use a pacifier. Research has shown that pacifier use during the first year of a baby's life decreases the risk of sudden infant death syndrome (SIDS). Allow your baby to feed on each breast as long as he or she wants. Breastfeed until your baby is finished feeding. When your baby unlatches or falls asleep while feeding from the first breast, offer the second breast. Because newborns are often sleepy in the first few weeks of life, you may need to awaken your baby to get him or her to feed. Breastfeeding times will vary from baby to baby. However, the following rules can serve as a guide to help you ensure that your baby is properly fed:  Newborns (babies 5 weeks of age or younger) may breastfeed every 1-3  hours.  Newborns should not go longer than 3 hours during the day or 5 hours during the night without breastfeeding.  You should breastfeed your baby a minimum of 8 times in a 24-hour period until you begin to introduce solid foods to your baby at around 54 months of age. BREAST MILK PUMPING Pumping and storing breast milk allows you to ensure that your baby is exclusively fed your breast milk, even at times when you are unable to breastfeed. This is especially important if you are going back to work while you are still breastfeeding or when you are not able to be present during feedings. Your lactation consultant can give you guidelines on how long it is safe to store breast milk.  A breast pump is a machine that allows you to pump milk from your breast into a sterile bottle. The pumped breast milk can then be stored in a refrigerator or freezer. Some breast pumps are operated by hand, while others use electricity. Ask your lactation consultant which type will work best for you. Breast pumps can be purchased, but some hospitals and breastfeeding support groups lease breast pumps on a monthly basis. A lactation consultant can teach you how to hand express breast milk, if you prefer not to use a pump.  CARING FOR YOUR BREASTS WHILE YOU BREASTFEED Nipples can become dry, cracked, and sore while breastfeeding. The following recommendations can help keep your breasts moisturized and healthy:  Avoid using soap on your nipples.   Wear a supportive bra. Although not required, special nursing bras and tank tops are designed to allow access to your breasts for breastfeeding without taking off your entire bra or top. Avoid wearing underwire-style bras or extremely tight bras.  Air dry your nipples for 3-98minutes after each feeding.   Use only cotton bra pads to absorb leaked breast milk. Leaking of breast milk between feedings is normal.   Use lanolin on your nipples after breastfeeding. Lanolin helps to  maintain your skin's normal moisture barrier. If you use pure lanolin, you do not need to wash it off before feeding your baby again. Pure lanolin is not toxic to your baby. You may also hand express a few drops of breast milk and gently massage that milk into your nipples and allow the milk to air dry. In the first few weeks after giving birth, some women experience extremely full breasts (engorgement). Engorgement can make your breasts feel heavy, warm, and tender to the  touch. Engorgement peaks within 3-5 days after you give birth. The following recommendations can help ease engorgement:  Completely empty your breasts while breastfeeding or pumping. You may want to start by applying warm, moist heat (in the shower or with warm water-soaked hand towels) just before feeding or pumping. This increases circulation and helps the milk flow. If your baby does not completely empty your breasts while breastfeeding, pump any extra milk after he or she is finished.  Wear a snug bra (nursing or regular) or tank top for 1-2 days to signal your body to slightly decrease milk production.  Apply ice packs to your breasts, unless this is too uncomfortable for you.  Make sure that your baby is latched on and positioned properly while breastfeeding. If engorgement persists after 48 hours of following these recommendations, contact your health care provider or a lactation consultant. OVERALL HEALTH CARE RECOMMENDATIONS WHILE BREASTFEEDING  Eat healthy foods. Alternate between meals and snacks, eating 3 of each per day. Because what you eat affects your breast milk, some of the foods may make your baby more irritable than usual. Avoid eating these foods if you are sure that they are negatively affecting your baby.  Drink milk, fruit juice, and water to satisfy your thirst (about 10 glasses a day).   Rest often, relax, and continue to take your prenatal vitamins to prevent fatigue, stress, and anemia.  Continue  breast self-awareness checks.  Avoid chewing and smoking tobacco.  Avoid alcohol and drug use. Some medicines that may be harmful to your baby can pass through breast milk. It is important to ask your health care provider before taking any medicine, including all over-the-counter and prescription medicine as well as vitamin and herbal supplements. It is possible to become pregnant while breastfeeding. If birth control is desired, ask your health care provider about options that will be safe for your baby. SEEK MEDICAL CARE IF:   You feel like you want to stop breastfeeding or have become frustrated with breastfeeding.  You have painful breasts or nipples.  Your nipples are cracked or bleeding.  Your breasts are red, tender, or warm.  You have a swollen area on either breast.  You have a fever or chills.  You have nausea or vomiting.  You have drainage other than breast milk from your nipples.  Your breasts do not become full before feedings by the fifth day after you give birth.  You feel sad and depressed.  Your baby is too sleepy to eat well.  Your baby is having trouble sleeping.   Your baby is wetting less than 3 diapers in a 24-hour period.  Your baby has less than 3 stools in a 24-hour period.  Your baby's skin or the white part of his or her eyes becomes yellow.   Your baby is not gaining weight by 5 days of age. SEEK IMMEDIATE MEDICAL CARE IF:   Your baby is overly tired (lethargic) and does not want to wake up and feed.  Your baby develops an unexplained fever. Document Released: 08/01/2005 Document Revised: 08/06/2013 Document Reviewed: 01/23/2013 [employer] Patient Information 2015 [employer]. This information is not intended to replace advice given to you by your health care provider. Make sure you discuss any questions you have with your health care provider.  

## 2014-05-14 NOTE — Addendum Note (Signed)
Addended by: Rutherford Nail E on: 05/14/2014 12:03 PM   Modules accepted: Orders

## 2014-05-14 NOTE — Progress Notes (Signed)
1hr today due at 1145.

## 2014-05-14 NOTE — Progress Notes (Signed)
28 weeks, complaining of decreased energy and increase in contractions, at night.  Difficulty finding comfortable sleep position.   Endorses good fetal movement.  Denies vaginal bleeding, LOF, dysuria.   Continue PNV.   Tdap today.   Comfort measures discussed.   RTC 2 weeks.

## 2014-05-15 LAB — GLUCOSE TOLERANCE, 1 HOUR (50G) W/O FASTING: Glucose, 1 Hour GTT: 112 mg/dL (ref 70–140)

## 2014-05-15 LAB — CBC
HCT: 30.1 % — ABNORMAL LOW (ref 36.0–46.0)
Hemoglobin: 9.8 g/dL — ABNORMAL LOW (ref 12.0–15.0)
MCH: 29.9 pg (ref 26.0–34.0)
MCHC: 32.6 g/dL (ref 30.0–36.0)
MCV: 91.8 fL (ref 78.0–100.0)
PLATELETS: 244 10*3/uL (ref 150–400)
RBC: 3.28 MIL/uL — AB (ref 3.87–5.11)
RDW: 13.5 % (ref 11.5–15.5)
WBC: 10.9 10*3/uL — ABNORMAL HIGH (ref 4.0–10.5)

## 2014-05-15 LAB — RPR

## 2014-05-15 LAB — HIV ANTIBODY (ROUTINE TESTING W REFLEX): HIV 1&2 Ab, 4th Generation: NONREACTIVE

## 2014-05-28 ENCOUNTER — Telehealth: Payer: Self-pay | Admitting: *Deleted

## 2014-05-28 ENCOUNTER — Ambulatory Visit (INDEPENDENT_AMBULATORY_CARE_PROVIDER_SITE_OTHER): Payer: Medicaid Other | Admitting: Advanced Practice Midwife

## 2014-05-28 VITALS — BP 102/64 | HR 92 | Temp 98.3°F | Wt 140.0 lb

## 2014-05-28 DIAGNOSIS — Z3490 Encounter for supervision of normal pregnancy, unspecified, unspecified trimester: Secondary | ICD-10-CM

## 2014-05-28 DIAGNOSIS — Z23 Encounter for immunization: Secondary | ICD-10-CM

## 2014-05-28 LAB — POCT URINALYSIS DIP (DEVICE)
Bilirubin Urine: NEGATIVE
Glucose, UA: NEGATIVE mg/dL
Hgb urine dipstick: NEGATIVE
Ketones, ur: NEGATIVE mg/dL
NITRITE: NEGATIVE
PROTEIN: NEGATIVE mg/dL
Specific Gravity, Urine: 1.02 (ref 1.005–1.030)
UROBILINOGEN UA: 1 mg/dL (ref 0.0–1.0)
pH: 7 (ref 5.0–8.0)

## 2014-05-28 MED ORDER — CYCLOBENZAPRINE HCL 10 MG PO TABS: 5.0000 mg | ORAL_TABLET | Freq: Three times a day (TID) | ORAL | Status: DC | PRN

## 2014-05-28 NOTE — Progress Notes (Signed)
Requests refill of flexeril.  Reports intermittent lower back pain and braxton hicks.  Discussed flu vaccine-- patient declines today.

## 2014-05-28 NOTE — Progress Notes (Signed)
Doing well.  Good fetal movement, denies vaginal bleeding, LOF, regular contractions.  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  Pregnancy support belt causes itching, recommend wearing over shirt but wearing when at work.  Renewed flexeril Rx.

## 2014-05-28 NOTE — Telephone Encounter (Signed)
Pt called front office desk with c/o nausea, vomiting and diarrhea.  I was asked by Morley Kos to speak with the pt. Crystal Jenkins stated she began having N,V and D this morning and wanted to know if she needed to be seen today or wait until her scheduled appt tomorrow. Pt also stated that she has had these same symptoms previously when she had a stomach virus. I advised pt to take frequent sips of clear liquids/water so as not to become dehydrated. If she is able to tolerate the liquids, she may advance to a light diet such as toast, soup. If she is unable to keep down liquids, develops fever and/or becomes weak and dizzy she should go to MAU for evaluation. Pt voiced understanding.

## 2014-05-29 ENCOUNTER — Inpatient Hospital Stay (HOSPITAL_COMMUNITY)
Admission: AD | Admit: 2014-05-29 | Discharge: 2014-05-29 | Disposition: A | Discharge status: Home / Self Care | Payer: Medicaid Other | Source: Ambulatory Visit | Attending: Family Medicine | Admitting: Family Medicine

## 2014-05-29 DIAGNOSIS — Z87891 Personal history of nicotine dependence: Secondary | ICD-10-CM | POA: Diagnosis not present

## 2014-05-29 DIAGNOSIS — O26893 Other specified pregnancy related conditions, third trimester: Secondary | ICD-10-CM

## 2014-05-29 DIAGNOSIS — Z3A30 30 weeks gestation of pregnancy: Secondary | ICD-10-CM

## 2014-05-29 DIAGNOSIS — O9989 Other specified diseases and conditions complicating pregnancy, childbirth and the puerperium: Secondary | ICD-10-CM | POA: Diagnosis not present

## 2014-05-29 DIAGNOSIS — W1839XA Other fall on same level, initial encounter: Secondary | ICD-10-CM

## 2014-05-29 DIAGNOSIS — M549 Dorsalgia, unspecified: Secondary | ICD-10-CM | POA: Insufficient documentation

## 2014-05-29 DIAGNOSIS — Y92009 Unspecified place in unspecified non-institutional (private) residence as the place of occurrence of the external cause: Secondary | ICD-10-CM | POA: Diagnosis not present

## 2014-05-29 DIAGNOSIS — W19XXXA Unspecified fall, initial encounter: Secondary | ICD-10-CM | POA: Insufficient documentation

## 2014-05-29 DIAGNOSIS — S3982XA Other specified injuries of lower back, initial encounter: Secondary | ICD-10-CM

## 2014-05-29 DIAGNOSIS — Z3493 Encounter for supervision of normal pregnancy, unspecified, third trimester: Secondary | ICD-10-CM

## 2014-05-29 LAB — URINALYSIS, ROUTINE W REFLEX MICROSCOPIC
Bilirubin Urine: NEGATIVE
Glucose, UA: NEGATIVE mg/dL
KETONES UR: 15 mg/dL — AB
NITRITE: NEGATIVE
Protein, ur: NEGATIVE mg/dL
Specific Gravity, Urine: 1.025 (ref 1.005–1.030)
UROBILINOGEN UA: 1 mg/dL (ref 0.0–1.0)
pH: 6.5 (ref 5.0–8.0)

## 2014-05-29 LAB — URINE MICROSCOPIC-ADD ON

## 2014-05-29 NOTE — MAU Provider Note (Signed)
History     CSN: [id number]  Arrival date and time: 05/29/14 1114   None     Chief Complaint  Patient presents with  . Fall   Fall Pertinent negatives include no fever, nausea or vomiting.   Patient is 28 y.o. [id number] [redacted]w[redacted]d here with complaints of back pain, wanting to make sure baby is ok and pink tinged urine earlier this morning.  Overall she denies vaginal bleeding, no blood on underwear or toilet paper.  Pt comes in today due to fall on her back last night at apx 11pm yesterday, she denies hitting or falling on her abdomen. She went to sleep without pain however woke up this morning in pain, mostly in lower back and side. Does not have pain when she is not moving. Works on her feet and has been in pain.  Feels baby move. Does not feel contractions.  +FM, denies LOF, contractions, vaginal discharge.   Past Medical History  Diagnosis Date  . Cancer     cervical  . Extrauterine pregnancy   . Vertigo   . Adenocarcinoma of Bartholin's gland, stage 1   . Vaginal Pap smear, abnormal     2005; had cryo  . Headache(784.0)   . Vertigo   . Infection     UTI  . MRSA (methicillin resistant Staphylococcus aureus)     Past Surgical History  Procedure Laterality Date  . Cesarean section    . Dilation and curettage of uterus    . Cryotherapy    . Laparotomy      removal of ectopic preg    Family History  Problem Relation Age of Onset  . Hypertension Mother   . Stroke Mother   . Heart disease Mother   . Epilepsy Mother   . Heart disease Paternal Aunt   . Hypertension Maternal Grandmother   . Diabetes Maternal Grandmother   . Cancer Maternal Grandmother     History  Substance Use Topics  . Smoking status: Former Smoker    Types: Cigarettes  . Smokeless tobacco: Never Used     Comment: social somoker, quit with +preg  . Alcohol Use: No     Comment: occasionally    Allergies:  Allergies  Allergen Reactions  . Bactrim [Sulfamethoxazole-Trimethoprim] Other  (See Comments)    Constipation   . Oxycodone Itching    Facility-administered medications prior to admission  Medication Dose Route Frequency Provider Last Rate Last Dose  . Tdap (BOOSTRIX) injection 0.5 mL  0.5 mL Intramuscular Once Paticia Stack, PA-C       Prescriptions prior to admission  Medication Sig Dispense Refill  . cyclobenzaprine (FLEXERIL) 10 MG tablet Take 0.5-1 tablets (5-10 mg total) by mouth 3 (three) times daily as needed for muscle spasms.  30 tablet  2  . meclizine (ANTIVERT) 25 MG tablet Take 25 mg by mouth 3 (three) times daily as needed for dizziness.      . Prenatal Vit-Fe Fumarate-FA (PRENATAL MULTIVITAMIN) TABS tablet Take 1 tablet by mouth daily at 12 noon.        Review of Systems  Constitutional: Negative for fever and chills.  Respiratory: Negative for cough and shortness of breath.   Cardiovascular: Negative for chest pain and leg swelling.  Gastrointestinal: Negative for heartburn, nausea, vomiting and diarrhea.  Genitourinary: Negative for dysuria, urgency and frequency.  Musculoskeletal: Positive for back pain and falls. Negative for neck pain.  Neurological: Negative for dizziness and focal weakness.  No headache   Physical Exam   Blood pressure 106/67, pulse 90, temperature 99.1 F (37.3 C), temperature source Oral, resp. rate 18, last menstrual period 10/30/2013, unknown if currently breastfeeding.  Physical Exam  Constitutional: She is oriented to person, place, and time. She appears well-developed and well-nourished.  HENT:  Head: Normocephalic and atraumatic.  Eyes: Conjunctivae and EOM are normal.  Neck: Normal range of motion.  Cardiovascular: Normal rate.   Respiratory: Effort normal. No respiratory distress.  GI: Soft. She exhibits no distension. There is no tenderness.  Genitourinary:  Cervical os visualized with speculum, no vaginal discharge, no blood in vault, no blood in os  Musculoskeletal: Normal range of motion.  She exhibits no edema.  Neurological: She is alert and oriented to person, place, and time.  Skin: Skin is warm and dry. No erythema.   Dilation: Closed Effacement (%): Thick Cervical Position: Middle  MAU Course  Procedures  MDM Prolonged monitoring x 4 hours => baseline 145, +accels, no decels, rare irregular contractions <6/hour  Urinalysis    Component Value Date/Time   COLORURINE YELLOW 05/29/2014 [place] 05/29/2014 1145   LABSPEC 1.025 05/29/2014 1145   PHURINE 6.5 05/29/2014 1145   GLUCOSEU NEGATIVE 05/29/2014 1145   HGBUR LARGE* 05/29/2014 1145   BILIRUBINUR NEGATIVE 05/29/2014 1145   KETONESUR 15* 05/29/2014 1145   PROTEINUR NEGATIVE 05/29/2014 1145   UROBILINOGEN 1.0 05/29/2014 1145   NITRITE NEGATIVE 05/29/2014 1145   LEUKOCYTESUR TRACE* 05/29/2014 1145      Assessment and Plan  Patient is 28 y.o. [id number] [redacted]w[redacted]d reporting back pain and pink tinged urine likely secondary to fall - fetal kick counts reinforced - preterm labor precautions - abruption precautions, no signs of preterm labor or abruption at this time - large hgb and trace leuks on UA ==> urine culture sent, no abx at this time as pt is asymptomatic  Porchia Sinkler ROCIO 05/29/2014, 12:13 PM

## 2014-05-29 NOTE — MAU Note (Signed)
Fell last night around 11. Fell back onto back and left side.  Pain in lower left side of abd. Pinkish spotting this morning. Went on to work. Pain got worse with movement.

## 2014-05-29 NOTE — Discharge Instructions (Signed)
What Do I Need to Know About Injuries During Pregnancy? °Injuries can happen during pregnancy. Minor falls and accidents usually do not harm you or your baby. However, any injury should be reported to your doctor. °WHAT CAN I DO TO PROTECT MYSELF FROM INJURIES? °· Remove rugs and loose objects on the floor. °· Wear comfortable shoes that have a good grip. Do not wear high-heeled shoes. °· Always wear your seat belt. The lap belt should be below your belly. Always practice safe driving. °· Do not ride on a motorcycle. °· Do not participate in high-impact activities or sports. °· Avoid: °¨ Walking on wet or slippery floors. °¨ Fires. °¨ Starting fires. °¨ Lifting heavy pots of boiling or hot liquids. °¨ Fixing electrical problems. °· Only take medicine as told by your doctor. °· Know your blood type and the blood type of the baby's father. °· Call your local emergency services (911 in the [place] if you are a victim of domestic violence or assault. For help and support, contact the National Domestic Violence Hotline. °WHEN SHOULD I GET HELP RIGHT AWAY? °· You fall on your belly or have any high-impact accident or injury. °· You have been a victim of domestic violence or any kind of violence. °· You have been in a car accident. °· You have bleeding from your vagina. °· Fluid is leaking from your vagina. °· You start to have belly cramping (contractions) or pain. °· You feel weak or pass out (faint). °· You start to throw up (vomit) after an injury. °· You have been burned. °· You have a stiff neck or neck pain. °· You get a headache or have vision problems after an injury. °· You do not feel the baby move or the baby is not moving as much as normal. °Document Released: 09/03/2010 Document Revised: 12/16/2013 Document Reviewed: 05/08/2013 °ExitCare® Patient Information ©2015 [employer]. This information is not intended to replace advice given to you by your health care provider. Make sure you discuss any questions you  have with your health care provider. ° °

## 2014-05-30 LAB — CULTURE, OB URINE: Colony Count: 30000

## 2014-05-30 NOTE — MAU Provider Note (Signed)
Attestation of Attending Supervision of Advanced Practitioner: Evaluation and management procedures were performed by the PA/NP/CNM/OB Fellow under my supervision/collaboration. Chart reviewed and agree with management and plan.  Jahnay Lantier V 05/30/2014 10:06 PM

## 2014-06-11 ENCOUNTER — Encounter (HOSPITAL_COMMUNITY): Payer: Self-pay | Admitting: *Deleted

## 2014-06-11 ENCOUNTER — Inpatient Hospital Stay (HOSPITAL_COMMUNITY)
Admission: AD | Admit: 2014-06-11 | Discharge: 2014-06-11 | Disposition: A | Discharge status: Home / Self Care | Payer: Medicaid Other | Source: Ambulatory Visit | Attending: Obstetrics & Gynecology | Admitting: Obstetrics & Gynecology

## 2014-06-11 DIAGNOSIS — Z87891 Personal history of nicotine dependence: Secondary | ICD-10-CM | POA: Diagnosis not present

## 2014-06-11 DIAGNOSIS — Z3493 Encounter for supervision of normal pregnancy, unspecified, third trimester: Secondary | ICD-10-CM

## 2014-06-11 DIAGNOSIS — Z3A32 32 weeks gestation of pregnancy: Secondary | ICD-10-CM | POA: Diagnosis not present

## 2014-06-11 DIAGNOSIS — N750 Cyst of Bartholin's gland: Secondary | ICD-10-CM | POA: Insufficient documentation

## 2014-06-11 DIAGNOSIS — O34219 Maternal care for unspecified type scar from previous cesarean delivery: Secondary | ICD-10-CM

## 2014-06-11 MED ORDER — SULFAMETHOXAZOLE-TMP DS 800-160 MG PO TABS: 1.0000 | ORAL_TABLET | Freq: Two times a day (BID) | ORAL | Status: DC

## 2014-06-11 MED ORDER — HYDROMORPHONE HCL 1 MG/ML IJ SOLN
1.0000 mg | Freq: Once | INTRAMUSCULAR | Status: AC
  Administered 2014-06-11: 1 mg via INTRAMUSCULAR
  Filled 2014-06-11: qty 1

## 2014-06-11 NOTE — MAU Note (Signed)
Consent signed for drainage of Bartolin's cyst.

## 2014-06-11 NOTE — Discharge Instructions (Signed)
Bartholin's Cyst or Abscess °Bartholin's glands are small glands located within the folds of skin (labia) along the sides of the lower opening of the vagina (birth canal). A cyst may develop when the duct of the gland becomes blocked. When this happens, fluid that accumulates within the cyst can become infected. This is known as an abscess. The Bartholin gland produces a mucous fluid to lubricate the outside of the vagina during sexual intercourse. °SYMPTOMS  °· Patients with a small cyst may not have any symptoms. °· Mild discomfort to severe pain depending on the size of the cyst and if it is infected (abscess). °· Pain, redness, and swelling around the lower opening of the vagina. °· Painful intercourse. °· Pressure in the perineal area. °· Swelling of the lips of the vagina (labia). °· The cyst or abscess can be on one side or both sides of the vagina. °DIAGNOSIS  °· A large swelling is seen in the lower vagina area by your caregiver. °· Painful to touch. °· Redness and pain, if it is an abscess. °TREATMENT  °· Sometimes the cyst will go away on its own. °· Apply warm wet compresses to the area or take hot sitz baths several times a day. °· An incision to drain the cyst or abscess with local anesthesia. °· Culture the pus, if it is an abscess. °· Antibiotic treatment, if it is an abscess. °· Cut open the gland and suture the edges to make the opening of the gland bigger (marsupialization). °· Remove the whole gland if the cyst or abscess returns. °PREVENTION  °· Practice good hygiene. °· Clean the vaginal area with a mild soap and soft cloth when bathing. °· Do not rub hard in the vaginal area when bathing. °· Protect the crotch area with a padded cushion if you take long bike rides or ride horses. °· Be sure you are well lubricated when you have sexual intercourse. °HOME CARE INSTRUCTIONS  °· If your cyst or abscess was opened, a small piece of gauze, or a drain, may have been placed in the wound to allow  drainage. Do not remove this gauze or drain unless directed by your caregiver. °· Wear feminine pads, not tampons, as needed for any drainage or bleeding. °· If antibiotics were prescribed, take them exactly as directed. Finish the entire course. °· Only take over-the-counter or prescription medicines for pain, discomfort, or fever as directed by your caregiver. °SEEK IMMEDIATE MEDICAL CARE IF:  °· You have an increase in pain, redness, swelling, or drainage. °· You have bleeding from the wound which results in the use of more than the number of pads suggested by your caregiver in 24 hours. °· You have chills. °· You have a fever. °· You develop any new problems (symptoms) or aggravation of your existing condition. °MAKE SURE YOU:  °· Understand these instructions. °· Will watch your condition. °· Will get help right away if you are not doing well or get worse. °Document Released: 08/01/2005 Document Revised: 10/24/2011 Document Reviewed: 03/19/2008 °ExitCare® Patient Information ©2015 [employer]. This information is not intended to replace advice given to you by your health care provider. Make sure you discuss any questions you have with your health care provider. ° °

## 2014-06-11 NOTE — MAU Provider Note (Signed)
History     CSN: [id number]  Arrival date and time: 06/11/14 1643   First Provider Initiated Contact with Patient 06/11/14 1848      Chief Complaint  Patient presents with  . Recurrent Skin Infections   HPI Crystal Jenkins is a 28 y.o. [id number] at [redacted]w[redacted]d who presents with left labial swelling for the past 4 days consistent with previous Bartholin cysts/abcsesses. Patient reports numerous prior Bartholin cysts, however this is the second on her left side. Patient had one 2 months ago which was drained and had catheter placed, however fell out the next day. Says that the swelling never completely went away. No fevers or chills.  No LOF or vaginal bleeding. No contractions. Good fetal movement.   OB History   Grav Para Term Preterm Abortions TAB SAB Ect Mult Living   5 2 2  0 2  1 1  2       Past Medical History  Diagnosis Date  . Cancer     cervical  . Extrauterine pregnancy   . Vertigo   . Adenocarcinoma of Bartholin's gland, stage 1   . Vaginal Pap smear, abnormal     2005; had cryo  . Headache(784.0)   . Vertigo   . Infection     UTI  . MRSA (methicillin resistant Staphylococcus aureus)     Past Surgical History  Procedure Laterality Date  . Cesarean section    . Dilation and curettage of uterus    . Cryotherapy    . Laparotomy      removal of ectopic preg    Family History  Problem Relation Age of Onset  . Hypertension Mother   . Stroke Mother   . Heart disease Mother   . Epilepsy Mother   . Heart disease Paternal Aunt   . Hypertension Maternal Grandmother   . Diabetes Maternal Grandmother   . Cancer Maternal Grandmother     History  Substance Use Topics  . Smoking status: Former Smoker    Types: Cigarettes  . Smokeless tobacco: Never Used     Comment: social somoker, quit with +preg  . Alcohol Use: No     Comment: occasionally    Allergies:  Allergies  Allergen Reactions  . Bactrim [Sulfamethoxazole-Trimethoprim] Other (See Comments)   Constipation   . Oxycodone Itching    Facility-administered medications prior to admission  Medication Dose Route Frequency Provider Last Rate Last Dose  . Tdap (BOOSTRIX) injection 0.5 mL  0.5 mL Intramuscular Once Paticia Stack, PA-C       Prescriptions prior to admission  Medication Sig Dispense Refill  . cyclobenzaprine (FLEXERIL) 10 MG tablet Take 0.5-1 tablets (5-10 mg total) by mouth 3 (three) times daily as needed for muscle spasms.  30 tablet  2  . Prenatal Vit-Fe Fumarate-FA (PRENATAL MULTIVITAMIN) TABS tablet Take 1 tablet by mouth daily at 12 noon.        Review of Systems  All other systems reviewed and are negative.  Physical Exam   Blood pressure 106/66, pulse 118, temperature 98.6 F (37 C), temperature source Oral, resp. rate 18, last menstrual period 10/30/2013, unknown if currently breastfeeding.  Physical Exam  Nursing note and vitals reviewed. Constitutional: She is oriented to person, place, and time. She appears well-developed and well-nourished.  HENT:  Head: Atraumatic.  Eyes: EOM are normal.  Neck: Normal range of motion.  Cardiovascular: Normal rate, regular rhythm and normal heart sounds.   No murmur heard. Respiratory: Effort normal and breath sounds  normal. No respiratory distress. She has no wheezes.  GI: Soft.  Uterine size appropriate for gestational age  Genitourinary:  Left labia swollen and indurated, most significant along inferior portion.  Musculoskeletal: Normal range of motion. She exhibits no edema.  Neurological: She is alert and oriented to person, place, and time. She has normal reflexes.  Skin: Skin is warm and dry.  FHT:  FHR: 145 bpm, variability: Moderate,  accelerations:  present,  decelerations:  none   MAU Course  Procedures-I&D of Bartholin's cyst, Word catheter placed.    Assessment and Plan  A: [redacted]w[redacted]d IUP. FHT category 1. Bartholin's abscess I&D'd.  P: Discharge home in stable condition. Word catheter in  place.  Will give 10 day course of Bactrim. Patient will follow up in 2 weeks for removal of catheter.   Dimas Chyle 06/11/2014, 6:53 PM   OB fellow attestation:  I have seen and examined this patient; I agree with above documentation in the resident's note.   Crystal Jenkins is a 28 y.o. [phone] reporting left labial bartholin cyst +FM, denies LOF, VB, contractions, vaginal discharge.  PE: BP 108/66  Pulse 101  Temp(Src) 97.4 F (36.3 C) (Oral)  Resp 18  LMP 10/30/2013 Gen: calm comfortable, NAD Resp: normal effort, no distress Abd: gravid  ROS, labs, PMH reviewed NST reactive  Procedure note: - verbally consented, risks and benefits of I&D and word catheter discussed - left labia cleansed with betadine - 10mL 1% lidocaine without epinephrine for skin wheel and then used to infiltrate incision site - 15 blade used for initial incision, 10cc of purulent sanguinous drained - word catheter placed and filled with 3cc fluid, remained in place appropriately after procedure - patient tolerated well, Dr. Elonda Husky present for procedure in its entirety   Plan: - 1mg  IM dilaudid for procedure - tylenol prn pain - po bactrim 1DS BID x 10 days, f/u clinic   => no bactrim allergy, reports allergy is constipation  Merla Riches, MD 8:54 PM

## 2014-06-11 NOTE — MAU Note (Signed)
Drainage of Bartolins cyst being performed by Dr. Deniece Ree, Dr. Elonda Husky, and Dr. Jerline Pain

## 2014-06-11 NOTE — Progress Notes (Signed)
Dr. Jerline Pain and Dr. Deniece Ree in to evaluate patient.

## 2014-06-11 NOTE — MAU Note (Signed)
Recurrent bartholin's cyst.  Coming on for about 4 days. is on left side.  No drainage.  Has been doing soaks

## 2014-06-13 ENCOUNTER — Encounter: Payer: Self-pay | Admitting: Family Medicine

## 2014-06-13 ENCOUNTER — Ambulatory Visit (INDEPENDENT_AMBULATORY_CARE_PROVIDER_SITE_OTHER): Payer: Medicaid Other | Admitting: Family Medicine

## 2014-06-13 VITALS — BP 114/69 | HR 112 | Temp 97.8°F | Wt 142.2 lb

## 2014-06-13 DIAGNOSIS — Z3493 Encounter for supervision of normal pregnancy, unspecified, third trimester: Secondary | ICD-10-CM

## 2014-06-13 DIAGNOSIS — N750 Cyst of Bartholin's gland: Secondary | ICD-10-CM

## 2014-06-13 NOTE — Progress Notes (Signed)
Seen in MAU two days ago due to bartholin cyst, which was I&D - has word catheter and is on bactrim.  Continues to drain.  Continues to have some pain, but improving slowly.  Good fetal movmenet.  No contractions.  F/u 2 weeks.

## 2014-06-13 NOTE — Progress Notes (Signed)
Went to MAU on 06/11/14 for bartholin cyst currently has a cath

## 2014-06-16 ENCOUNTER — Encounter (HOSPITAL_COMMUNITY): Payer: Self-pay | Admitting: *Deleted

## 2014-06-17 ENCOUNTER — Inpatient Hospital Stay (HOSPITAL_COMMUNITY)
Admission: AD | Admit: 2014-06-17 | Discharge: 2014-06-17 | Disposition: A | Discharge status: Home / Self Care | Payer: Medicaid Other | Source: Ambulatory Visit | Attending: Obstetrics & Gynecology | Admitting: Obstetrics & Gynecology

## 2014-06-17 ENCOUNTER — Encounter (HOSPITAL_COMMUNITY): Payer: Self-pay | Admitting: *Deleted

## 2014-06-17 DIAGNOSIS — Z3A32 32 weeks gestation of pregnancy: Secondary | ICD-10-CM | POA: Diagnosis not present

## 2014-06-17 DIAGNOSIS — Z3493 Encounter for supervision of normal pregnancy, unspecified, third trimester: Secondary | ICD-10-CM

## 2014-06-17 DIAGNOSIS — K5900 Constipation, unspecified: Secondary | ICD-10-CM | POA: Diagnosis present

## 2014-06-17 DIAGNOSIS — K5909 Other constipation: Secondary | ICD-10-CM

## 2014-06-17 DIAGNOSIS — O34219 Maternal care for unspecified type scar from previous cesarean delivery: Secondary | ICD-10-CM

## 2014-06-17 DIAGNOSIS — O9989 Other specified diseases and conditions complicating pregnancy, childbirth and the puerperium: Secondary | ICD-10-CM | POA: Insufficient documentation

## 2014-06-17 DIAGNOSIS — Z87891 Personal history of nicotine dependence: Secondary | ICD-10-CM | POA: Insufficient documentation

## 2014-06-17 MED ORDER — POLYETHYLENE GLYCOL 3350 17 GM/SCOOP PO POWD: 17.0000 g | Freq: Every day | ORAL | Status: DC

## 2014-06-17 NOTE — Discharge Instructions (Signed)
You were seen at [facility] for constipation. You were given a soap suds enema and are safe for discharge home. You should make sure you are drinking plenty of water, eating plenty of fruits and vegetables. You may use prune juice daily and stay active to help prevent constipation. You may use Miralax 1 capful daily to help soften your stool. You should follow up in clinic as scheduled in one week for reevaluation. You should seek care sooner if you develop vomiting, inability to keep down fluids, severe abdominal pain, or if you stop passing gas completely.   Constipation Constipation is when a person has fewer than three bowel movements a week, has difficulty having a bowel movement, or has stools that are dry, hard, or larger than normal. As people grow older, constipation is more common. If you try to fix constipation with medicines that make you have a bowel movement (laxatives), the problem may get worse. Long-term laxative use may cause the muscles of the colon to become weak. A low-fiber diet, not taking in enough fluids, and taking certain medicines may make constipation worse.  CAUSES   Certain medicines, such as antidepressants, pain medicine, iron supplements, antacids, and water pills.   Certain diseases, such as diabetes, irritable bowel syndrome (IBS), thyroid disease, or depression.   Not drinking enough water.   Not eating enough fiber-rich foods.   Stress or travel.   Lack of physical activity or exercise.   Ignoring the urge to have a bowel movement.   Using laxatives too much.  SIGNS AND SYMPTOMS   Having fewer than three bowel movements a week.   Straining to have a bowel movement.   Having stools that are hard, dry, or larger than normal.   Feeling full or bloated.   Pain in the lower abdomen.   Not feeling relief after having a bowel movement.  DIAGNOSIS  Your health care provider will take a medical history and perform a physical exam.  Further testing may be done for severe constipation. Some tests may include:  A barium enema X-ray to examine your rectum, colon, and, sometimes, your small intestine.   A sigmoidoscopy to examine your lower colon.   A colonoscopy to examine your entire colon. TREATMENT  Treatment will depend on the severity of your constipation and what is causing it. Some dietary treatments include drinking more fluids and eating more fiber-rich foods. Lifestyle treatments may include regular exercise. If these diet and lifestyle recommendations do not help, your health care provider may recommend taking over-the-counter laxative medicines to help you have bowel movements. Prescription medicines may be prescribed if over-the-counter medicines do not work.  HOME CARE INSTRUCTIONS   Eat foods that have a lot of fiber, such as fruits, vegetables, whole grains, and beans.  Limit foods high in fat and processed sugars, such as french fries, hamburgers, cookies, candies, and soda.   A fiber supplement may be added to your diet if you cannot get enough fiber from foods.   Drink enough fluids to keep your urine clear or pale yellow.   Exercise regularly or as directed by your health care provider.   Go to the restroom when you have the urge to go. Do not hold it.   Only take over-the-counter or prescription medicines as directed by your health care provider. Do not take other medicines for constipation without talking to your health care provider first.  [place] IF:   You have bright red blood in your  stool.   Your constipation lasts for more than 4 days or gets worse.   You have abdominal or rectal pain.   You have thin, pencil-like stools.   You have unexplained weight loss. MAKE SURE YOU:   Understand these instructions.  Will watch your condition.  Will get help right away if you are not doing well or get worse. Document Released: 04/29/2004 Document Revised:  08/06/2013 Document Reviewed: 05/13/2013 [facility] Patient Information 2015 [place], [place]. This information is not intended to replace advice given to you by your health care provider. Make sure you discuss any questions you have with your health care provider.

## 2014-06-17 NOTE — MAU Provider Note (Cosign Needed)
History     CSN: [id number]  Arrival date and time: 06/17/14 1156   First Provider Initiated Contact with Patient 06/17/14 1215      Chief Complaint  Patient presents with  . Constipation  . Abdominal Cramping   HPI Crystal Jenkins is a 28 y.o. [id number] at [redacted]w[redacted]d who presents with a chief complaint of no bowel movements for 3 weeks. Patient said that she tried to have a bowel movement this morning but was unable to. Has not tried any stool softeners or laxatives. Endorses flatus. No nausea or vomiting. No diarrhea. No fevers or chills. Also endorses some lower abdominal pain and back pain for the past 3 days that is constant and described as aching. Has not tried anything for the pain.    Denies LOF, vaginal bleeding, and contractions. Endorses good FM.   OB History    Gravida Para Term Preterm AB TAB SAB Ectopic Multiple Living   5 2 2  0 2  1 1  2       Past Medical History  Diagnosis Date  . Cancer     cervical  . Extrauterine pregnancy   . Vertigo   . Adenocarcinoma of Bartholin's gland, stage 1   . Vaginal Pap smear, abnormal     2005; had cryo  . Headache(784.0)   . Vertigo   . Infection     UTI  . MRSA (methicillin resistant Staphylococcus aureus)     Past Surgical History  Procedure Laterality Date  . Cesarean section    . Dilation and curettage of uterus    . Cryotherapy    . Laparotomy      removal of ectopic preg    Family History  Problem Relation Age of Onset  . Hypertension Mother   . Stroke Mother   . Heart disease Mother   . Epilepsy Mother   . Heart disease Paternal Aunt   . Hypertension Maternal Grandmother   . Diabetes Maternal Grandmother   . Cancer Maternal Grandmother     History  Substance Use Topics  . Smoking status: Former Smoker    Types: Cigarettes  . Smokeless tobacco: Never Used     Comment: social somoker, quit with +preg  . Alcohol Use: No     Comment: occasionally    Allergies:  Allergies  Allergen Reactions  .  Bactrim [Sulfamethoxazole-Trimethoprim] Other (See Comments)    Constipation   . Oxycodone Itching    Facility-administered medications prior to admission  Medication Dose Route Frequency Provider Last Rate Last Dose  . Tdap (BOOSTRIX) injection 0.5 mL  0.5 mL Intramuscular Once Paticia Stack, PA-C       Prescriptions prior to admission  Medication Sig Dispense Refill Last Dose  . cyclobenzaprine (FLEXERIL) 10 MG tablet Take 0.5-1 tablets (5-10 mg total) by mouth 3 (three) times daily as needed for muscle spasms. 30 tablet 2 06/16/2014 at Unknown time  . Prenatal Vit-Fe Fumarate-FA (PRENATAL MULTIVITAMIN) TABS tablet Take 1 tablet by mouth daily at 12 noon.   06/17/2014 at Unknown time  . sulfamethoxazole-trimethoprim (BACTRIM DS) 800-160 MG per tablet Take 1 tablet by mouth 2 (two) times daily. (Patient not taking: Reported on 06/17/2014) 20 tablet 0 Taking    Review of Systems  All other systems reviewed and are negative.  Physical Exam   Blood pressure 114/74, pulse 113, temperature 98.4 F (36.9 C), temperature source Oral, resp. rate 18, height 5' 2.5" (1.588 m), weight 65.862 kg (145 lb 3.2 oz), last  menstrual period 10/30/2013, unknown if currently breastfeeding.  Physical Exam  Nursing note and vitals reviewed. Constitutional: She is oriented to person, place, and time. She appears well-developed and well-nourished. No distress.  HENT:  Head: Normocephalic and atraumatic.  Eyes: EOM are normal.  Neck: Normal range of motion.  Cardiovascular: Normal rate, regular rhythm and normal heart sounds.   No murmur heard. Respiratory: Effort normal and breath sounds normal. No respiratory distress. She has no wheezes.  GI: Soft. Bowel sounds are normal. She exhibits no distension. There is tenderness (Mild lower abdominal tenderness).  Uterine size appropriate for gestational age  Musculoskeletal: Normal range of motion. She exhibits no edema.  Neurological: She is alert and  oriented to person, place, and time. She has normal reflexes.  Skin: Skin is warm and dry.  FHT:  FHR: 145 bpm, variability: mod,  accelerations:  present,  decelerations:  none  Procedures-None  Assessment and Plan  A:[redacted]w[redacted]d IUP with constipation. FHT category 1. No signs of obstruction (no n/v, good BS, passing flatus). Gave soap sud enema prior to discharge, tolerated well.   P: Discharge home in stable condition. Recommended daily miralax for constipation. Will follow up at scheduled prenatal visit.   Dimas Chyle 06/17/2014, 12:16 PM   OB fellow attestation:  I have seen and examined this patient; I agree with above documentation in the resident's note.   Suleyma Wafer is a 28 y.o. [phone] reporting constipation for the last several weeks. Passing flatus. Mild abdominal pain, but no nausea/vomiting. Tolerating PO intake.  +FM, denies LOF, VB, contractions, vaginal discharge.  PE: BP 114/74 mmHg  Pulse 113  Temp(Src) 98.4 F (36.9 C) (Oral)  Resp 18  Ht 5' 2.5" (1.588 m)  Wt 145 lb 3.2 oz (65.862 kg)  BMI 26.12 kg/m2  LMP 10/30/2013 Gen: calm comfortable, NAD Resp: normal effort, no distress Abd: gravid, mildly tender to palpation in lower abdomen, no rebound or guarding  ROS, labs, PMH reviewed NST reactive  Plan: #Constipation, no evidence of obstruction - Soap suds enema given - Rx Miralax daily - Counseled on hydration, walking, fruits/vegetables  #Not in labor and safe for discharge home - FM/PTL precautions reviewed - Follow up in OB clinic as scheduled.   Shelbie Hutching, MD 1:02 PM

## 2014-06-17 NOTE — MAU Note (Signed)
Patient states she has not had a bowel movement in 3 weeks. Has been cramping for about 3 days. Denies bleeding or leaking and reports good fetal movement.

## 2014-06-25 ENCOUNTER — Encounter: Payer: Self-pay | Admitting: Obstetrics and Gynecology

## 2014-06-25 ENCOUNTER — Ambulatory Visit (INDEPENDENT_AMBULATORY_CARE_PROVIDER_SITE_OTHER): Payer: Medicaid Other | Admitting: Obstetrics and Gynecology

## 2014-06-25 VITALS — BP 115/78 | HR 92 | Temp 98.2°F | Wt 144.7 lb

## 2014-06-25 DIAGNOSIS — Z3493 Encounter for supervision of normal pregnancy, unspecified, third trimester: Secondary | ICD-10-CM

## 2014-06-25 DIAGNOSIS — N750 Cyst of Bartholin's gland: Secondary | ICD-10-CM

## 2014-06-25 LAB — POCT URINALYSIS DIP (DEVICE)
Bilirubin Urine: NEGATIVE
GLUCOSE, UA: NEGATIVE mg/dL
HGB URINE DIPSTICK: NEGATIVE
Ketones, ur: NEGATIVE mg/dL
Nitrite: NEGATIVE
PROTEIN: NEGATIVE mg/dL
SPECIFIC GRAVITY, URINE: 1.02 (ref 1.005–1.030)
Urobilinogen, UA: 2 mg/dL — ABNORMAL HIGH (ref 0.0–1.0)
pH: 7 (ref 5.0–8.0)

## 2014-06-25 NOTE — Progress Notes (Signed)
Bartholin's with Word catheter still in but improving. Still has slight serous drainage. Declines check. Reviewed labor s/sx and plans. Will schedule next visit for RCS at 39 wks.

## 2014-06-25 NOTE — Progress Notes (Signed)
Pt reports having increased back pain.  Word cath still in place with discomfort but continues to drain.  Pt reports having to wear bedroom slippers to work and needs to have a work restrictions letter.  Contractions up to 7-8 contractions per day Pt reports having discharge clear, questions possible water breaking

## 2014-06-25 NOTE — Patient Instructions (Signed)
Third Trimester of Pregnancy The third trimester is from week 29 through week 42, months 7 through 9. The third trimester is a time when the fetus is growing rapidly. At the end of the ninth month, the fetus is about 20 inches in length and weighs 6-10 pounds.  BODY CHANGES Your body goes through many changes during pregnancy. The changes vary from woman to woman.   Your weight will continue to increase. You can expect to gain 25-35 pounds (11-16 kg) by the end of the pregnancy.  You may begin to get stretch marks on your hips, abdomen, and breasts.  You may urinate more often because the fetus is moving lower into your pelvis and pressing on your bladder.  You may develop or continue to have heartburn as a result of your pregnancy.  You may develop constipation because certain hormones are causing the muscles that push waste through your intestines to slow down.  You may develop hemorrhoids or swollen, bulging veins (varicose veins).  You may have pelvic pain because of the weight gain and pregnancy hormones relaxing your joints between the bones in your pelvis. Backaches may result from overexertion of the muscles supporting your posture.  You may have changes in your hair. These can include thickening of your hair, rapid growth, and changes in texture. Some women also have hair loss during or after pregnancy, or hair that feels dry or thin. Your hair will most likely return to normal after your baby is born.  Your breasts will continue to grow and be tender. A yellow discharge may leak from your breasts called colostrum.  Your belly button may stick out.  You may feel short of breath because of your expanding uterus.  You may notice the fetus "dropping," or moving lower in your abdomen.  You may have a bloody mucus discharge. This usually occurs a few days to a week before labor begins.  Your cervix becomes thin and soft (effaced) near your due date. WHAT TO EXPECT AT YOUR PRENATAL  EXAMS  You will have prenatal exams every 2 weeks until week 36. Then, you will have weekly prenatal exams. During a routine prenatal visit:  You will be weighed to make sure you and the fetus are growing normally.  Your blood pressure is taken.  Your abdomen will be measured to track your baby's growth.  The fetal heartbeat will be listened to.  Any test results from the previous visit will be discussed.  You may have a cervical check near your due date to see if you have effaced. At around 36 weeks, your caregiver will check your cervix. At the same time, your caregiver will also perform a test on the secretions of the vaginal tissue. This test is to determine if a type of bacteria, Group B streptococcus, is present. Your caregiver will explain this further. Your caregiver may ask you:  What your birth plan is.  How you are feeling.  If you are feeling the baby move.  If you have had any abnormal symptoms, such as leaking fluid, bleeding, severe headaches, or abdominal cramping.  If you have any questions. Other tests or screenings that may be performed during your third trimester include:  Blood tests that check for low iron levels (anemia).  Fetal testing to check the health, activity level, and growth of the fetus. Testing is done if you have certain medical conditions or if there are problems during the pregnancy. FALSE LABOR You may feel small, irregular contractions that   eventually go away. These are called Braxton Hicks contractions, or false labor. Contractions may last for hours, days, or even weeks before true labor sets in. If contractions come at regular intervals, intensify, or become painful, it is best to be seen by your caregiver.  SIGNS OF LABOR   Menstrual-like cramps.  Contractions that are 5 minutes apart or less.  Contractions that start on the top of the uterus and spread down to the lower abdomen and back.  A sense of increased pelvic pressure or back  pain.  A watery or bloody mucus discharge that comes from the vagina. If you have any of these signs before the 37th week of pregnancy, call your caregiver right away. You need to go to the hospital to get checked immediately. HOME CARE INSTRUCTIONS   Avoid all smoking, herbs, alcohol, and unprescribed drugs. These chemicals affect the formation and growth of the baby.  Follow your caregiver's instructions regarding medicine use. There are medicines that are either safe or unsafe to take during pregnancy.  Exercise only as directed by your caregiver. Experiencing uterine cramps is a good sign to stop exercising.  Continue to eat regular, healthy meals.  Wear a good support bra for breast tenderness.  Do not use hot tubs, steam rooms, or saunas.  Wear your seat belt at all times when driving.  Avoid raw meat, uncooked cheese, cat litter boxes, and soil used by cats. These carry germs that can cause birth defects in the baby.  Take your prenatal vitamins.  Try taking a stool softener (if your caregiver approves) if you develop constipation. Eat more high-fiber foods, such as fresh vegetables or fruit and whole grains. Drink plenty of fluids to keep your urine clear or pale yellow.  Take warm sitz baths to soothe any pain or discomfort caused by hemorrhoids. Use hemorrhoid cream if your caregiver approves.  If you develop varicose veins, wear support hose. Elevate your feet for 15 minutes, 3-4 times a day. Limit salt in your diet.  Avoid heavy lifting, wear low heal shoes, and practice good posture.  Rest a lot with your legs elevated if you have leg cramps or low back pain.  Visit your dentist if you have not gone during your pregnancy. Use a soft toothbrush to brush your teeth and be gentle when you floss.  A sexual relationship may be continued unless your caregiver directs you otherwise.  Do not travel far distances unless it is absolutely necessary and only with the approval  of your caregiver.  Take prenatal classes to understand, practice, and ask questions about the labor and delivery.  Make a trial run to the hospital.  Pack your hospital bag.  Prepare the baby's nursery.  Continue to go to all your prenatal visits as directed by your caregiver. SEEK MEDICAL CARE IF:  You are unsure if you are in labor or if your water has broken.  You have dizziness.  You have mild pelvic cramps, pelvic pressure, or nagging pain in your abdominal area.  You have persistent nausea, vomiting, or diarrhea.  You have a bad smelling vaginal discharge.  You have pain with urination. SEEK IMMEDIATE MEDICAL CARE IF:   You have a fever.  You are leaking fluid from your vagina.  You have spotting or bleeding from your vagina.  You have severe abdominal cramping or pain.  You have rapid weight loss or gain.  You have shortness of breath with chest pain.  You notice sudden or extreme swelling   of your face, hands, ankles, feet, or legs.  You have not felt your baby move in over an hour.  You have severe headaches that do not go away with medicine.  You have vision changes. Document Released: 07/26/2001 Document Revised: 08/06/2013 Document Reviewed: 10/02/2012 [employer] Patient Information 2015 [employer]. This information is not intended to replace advice given to you by your health care provider. Make sure you discuss any questions you have with your health care provider.  

## 2014-07-03 ENCOUNTER — Telehealth: Payer: Self-pay | Admitting: General Practice

## 2014-07-03 NOTE — Telephone Encounter (Signed)
Patient called and left message stating she is 35w pregnant and had a ward cath placed 2 weeks ago and it still hasn't fallen off and the area is still swollen and its hard around where the cath is. Called patient and asked if she had the place checked at her last appt with [place] and patient stated no because she was hurting so bad. Told patient that unfortunately this isn't something we can handle over the phone and that we have to see her and exam her and to try to make it until her next appt with [place] on Wednesday and if not she should go to MAU. Patient verbalized understanding and had no other questions

## 2014-07-09 ENCOUNTER — Ambulatory Visit (INDEPENDENT_AMBULATORY_CARE_PROVIDER_SITE_OTHER): Payer: Medicaid Other | Admitting: Family

## 2014-07-09 ENCOUNTER — Other Ambulatory Visit: Payer: Self-pay | Admitting: Family

## 2014-07-09 ENCOUNTER — Encounter: Payer: Self-pay | Admitting: Family

## 2014-07-09 VITALS — BP 118/79 | HR 100 | Temp 98.0°F | Wt 150.7 lb

## 2014-07-09 DIAGNOSIS — Z3493 Encounter for supervision of normal pregnancy, unspecified, third trimester: Secondary | ICD-10-CM

## 2014-07-09 LAB — POCT URINALYSIS DIP (DEVICE)
Bilirubin Urine: NEGATIVE
GLUCOSE, UA: NEGATIVE mg/dL
Hgb urine dipstick: NEGATIVE
Ketones, ur: NEGATIVE mg/dL
Nitrite: NEGATIVE
Protein, ur: NEGATIVE mg/dL
SPECIFIC GRAVITY, URINE: 1.015 (ref 1.005–1.030)
Urobilinogen, UA: 1 mg/dL (ref 0.0–1.0)
pH: 7 (ref 5.0–8.0)

## 2014-07-09 LAB — OB RESULTS CONSOLE GC/CHLAMYDIA
CHLAMYDIA, DNA PROBE: NEGATIVE
GC PROBE AMP, GENITAL: NEGATIVE

## 2014-07-09 LAB — OB RESULTS CONSOLE GBS: GBS: POSITIVE

## 2014-07-09 NOTE — Progress Notes (Signed)
I examined pt and agree with documentation above and resident plan of care.  GBS and GC/CT collected. Venia Carbon Michiel Cowboy, CNM

## 2014-07-09 NOTE — Progress Notes (Signed)
A: [id number] at [redacted]w[redacted]d here today for routine prenatal follow up. Bartholin Cyst s/p word catheter placement complicating pregnancy. Catheter placement 10/30 has now fallen out, and pt. Denies discharge, erythema, or tenderness around the previous catheter site at this time. She denies fever, chills, nausea, vomiting, LOF, VB, or Abdominal pain. She says that the baby is moving a lot and that she is having a hard time finding a comfortable position to sleep in and is experiencing some Heart Burn.   P:  - GBS swab done today.  - U/A normal.  - problem list updated.  - RTC in 4 weeks.

## 2014-07-09 NOTE — Progress Notes (Signed)
Edema- feet/legs Pt reports word cath fell out last Friday Pt states that she would like to meet the provider who will be doing her c-section

## 2014-07-10 LAB — GC/CHLAMYDIA PROBE AMP
CT PROBE, AMP APTIMA: NEGATIVE
GC PROBE AMP APTIMA: NEGATIVE

## 2014-07-11 LAB — CULTURE, BETA STREP (GROUP B ONLY)

## 2014-07-16 ENCOUNTER — Encounter: Payer: Self-pay | Admitting: *Deleted

## 2014-07-16 ENCOUNTER — Ambulatory Visit (INDEPENDENT_AMBULATORY_CARE_PROVIDER_SITE_OTHER): Payer: Medicaid Other | Admitting: Advanced Practice Midwife

## 2014-07-16 VITALS — BP 96/75 | HR 114 | Temp 98.5°F | Wt 149.9 lb

## 2014-07-16 DIAGNOSIS — Z3493 Encounter for supervision of normal pregnancy, unspecified, third trimester: Secondary | ICD-10-CM

## 2014-07-16 DIAGNOSIS — O3421 Maternal care for scar from previous cesarean delivery: Secondary | ICD-10-CM

## 2014-07-16 LAB — POCT URINALYSIS DIP (DEVICE)
Bilirubin Urine: NEGATIVE
Glucose, UA: NEGATIVE mg/dL
Hgb urine dipstick: NEGATIVE
NITRITE: NEGATIVE
PROTEIN: NEGATIVE mg/dL
Specific Gravity, Urine: 1.015 (ref 1.005–1.030)
UROBILINOGEN UA: 1 mg/dL (ref 0.0–1.0)
pH: 7 (ref 5.0–8.0)

## 2014-07-16 NOTE — Patient Instructions (Signed)
C-section is scheduled for 07/30/14.  Cesarean Delivery  Cesarean delivery is the birth of a baby through a cut (incision) in the abdomen and womb (uterus).  LET [facility] CARE PROVIDER KNOW ABOUT:  All medicines you are taking, including vitamins, herbs, eye drops, creams, and over-the-counter medicines.  Previous problems you or members of your family have had with the use of anesthetics.  Any blood disorders you have.  Previous surgeries you have had.  Medical conditions you have.  Any allergies you have.  Complicationsinvolving the pregnancy. RISKS AND COMPLICATIONS  Generally, this is a safe procedure. However, as with any procedure, complications can occur. Possible complications include: 1. Bleeding. 2. Infection. 3. Blood clots. 4. Injury to surrounding organs. 5. Problems with anesthesia. 6. Injury to the baby. BEFORE THE PROCEDURE   You may be given an antacid medicine to drink. This will prevent acid contents in your stomach from going into your lungs if you vomit during the surgery.  You may be given an antibiotic medicine to prevent infection. PROCEDURE   Hair may be removed from your pubic area and your lower abdomen. This is to prevent infection in the incision site.  A tube (Foley catheter) will be placed in your bladder to drain your urine from your bladder into a bag. This keeps your bladder empty during surgery.  An IV tube will be placed in your vein.  You may be given medicine to numb the lower half of your body (regional anesthetic). If you were in labor, you may have already had an epidural in place which can be used in both labor and cesarean delivery. You may possibly be given medicine to make you sleep (general anesthetic) though this is not as common.  An incision will be made in your abdomen that extends to your uterus. There are 2 basic kinds of incisions:  The horizontal (transverse) incision. Horizontal incisions are from side to side and  are used for most routine cesarean deliveries.  The vertical incision. The vertical incision is from the top of the abdomen to the bottom and is less commonly used. It is often done for women who have a serious complication (extreme prematurity) or under emergency situations.  The horizontal and vertical incisions may both be used at the same time. However, this is very uncommon.  An incision is then made in your uterus to deliver the baby.  Your baby will then be delivered.  Both incisions are then closed with absorbable stitches. AFTER THE PROCEDURE   If you were awake during the surgery, you will see your baby right away. If you were asleep, you will see your baby as soon as you are awake.  You may breastfeed your baby after surgery.  You may be able to get up and walk the same day as the surgery. If you need to stay in bed for a period of time, you will receive help to turn, cough, and take deep breaths after surgery. This helps prevent lung problems such as pneumonia.  Do not get out of bed alone the first time after surgery. You will need help getting out of bed until you are able to do this by yourself.  You may be able to shower the day after your cesarean delivery. After the bandage (dressing) is taken off the incision site, a nurse will assist you to shower if you would like help.  You will have pneumatic compression hose placed on your lower legs. This is done to prevent  blood clots. When you are up and walking regularly, they will no longer be necessary.  Do not cross your legs when you sit.  Save any blood clots that you pass. If you pass a clot while on the toilet, do not flush it. Call for the nurse. Tell the nurse if you think you are bleeding too much or passing too many clots.  You will be given medicine as needed. Let your health care providers know if you are hurting. You may also be given an antibiotic to prevent an infection.  Your IV tube will be taken out  when you are drinking a reasonable amount of fluids. The Foley catheter is taken out when you are up and walking.  If your blood type is Rh negative and your baby's blood type is Rh positive, you will be given a shot of anti-D immune globulin. This shot prevents you from having Rh problems with a future pregnancy. You should get the shot even if you had your tubes tied (tubal ligation).  If you are allowed to take the baby for a walk, place the baby in the bassinet and push it. Do not carry your baby in your arms. Document Released: 08/01/2005 Document Revised: 05/22/2013 Document Reviewed: 02/20/2013 [facility] Patient Information 2015 [place], [place]. This information is not intended to replace advice given to you by your health care provider. Make sure you discuss any questions you have with your health care provider.  Fetal Movement Counts Patient Name: __________________________________________________ Patient Due Date: ____________________ Performing a fetal movement count is highly recommended in high-risk pregnancies, but it is good for every pregnant woman to do. Your health care provider may ask you to start counting fetal movements at 28 weeks of the pregnancy. Fetal movements often increase:  After eating a full meal.  After physical activity.  After eating or drinking something sweet or cold.  At rest. Pay attention to when you feel the baby is most active. This will help you notice a pattern of your baby's sleep and wake cycles and what factors contribute to an increase in fetal movement. It is important to perform a fetal movement count at the same time each day when your baby is normally most active.  HOW TO COUNT FETAL MOVEMENTS 7. Find a quiet and comfortable area to sit or lie down on your left side. Lying on your left side provides the best blood and oxygen circulation to your baby. 8. Write down the day and time on a sheet of paper or in a journal. 9. Start counting kicks,  flutters, swishes, rolls, or jabs in a 2-hour period. You should feel at least 10 movements within 2 hours. 10. If you do not feel 10 movements in 2 hours, wait 2-3 hours and count again. Look for a change in the pattern or not enough counts in 2 hours. SEEK MEDICAL CARE IF:  You feel less than 10 counts in 2 hours, tried twice.  There is no movement in over an hour.  The pattern is changing or taking longer each day to reach 10 counts in 2 hours.  You feel the baby is not moving as he or she usually does. Date: ____________ Movements: ____________ Start time: ____________ Elizebeth Koller time: ____________  Date: ____________ Movements: ____________ Start time: ____________ Elizebeth Koller time: ____________ Date: ____________ Movements: ____________ Start time: ____________ Elizebeth Koller time: ____________ Date: ____________ Movements: ____________ Start time: ____________ Elizebeth Koller time: ____________ Date: ____________ Movements: ____________ Start time: ____________ Elizebeth Koller time: ____________ Date: ____________ Movements:  ____________ Start time: ____________ Elizebeth Koller time: ____________ Date: ____________ Movements: ____________ Start time: ____________ Elizebeth Koller time: ____________ Date: ____________ Movements: ____________ Start time: ____________ Elizebeth Koller time: ____________  Date: ____________ Movements: ____________ Start time: ____________ Elizebeth Koller time: ____________ Date: ____________ Movements: ____________ Start time: ____________ Elizebeth Koller time: ____________ Date: ____________ Movements: ____________ Start time: ____________ Elizebeth Koller time: ____________ Date: ____________ Movements: ____________ Start time: ____________ Elizebeth Koller time: ____________ Date: ____________ Movements: ____________ Start time: ____________ Elizebeth Koller time: ____________ Date: ____________ Movements: ____________ Start time: ____________ Elizebeth Koller time: ____________ Date: ____________ Movements: ____________ Start time: ____________ Elizebeth Koller time: ____________   Date: ____________ Movements: ____________ Start time: ____________ Elizebeth Koller time: ____________ Date: ____________ Movements: ____________ Start time: ____________ Elizebeth Koller time: ____________ Date: ____________ Movements: ____________ Start time: ____________ Elizebeth Koller time: ____________ Date: ____________ Movements: ____________ Start time: ____________ Elizebeth Koller time: ____________ Date: ____________ Movements: ____________ Start time: ____________ Elizebeth Koller time: ____________ Date: ____________ Movements: ____________ Start time: ____________ Elizebeth Koller time: ____________ Date: ____________ Movements: ____________ Start time: ____________ Elizebeth Koller time: ____________  Date: ____________ Movements: ____________ Start time: ____________ Elizebeth Koller time: ____________ Date: ____________ Movements: ____________ Start time: ____________ Elizebeth Koller time: ____________ Date: ____________ Movements: ____________ Start time: ____________ Elizebeth Koller time: ____________ Date: ____________ Movements: ____________ Start time: ____________ Elizebeth Koller time: ____________ Date: ____________ Movements: ____________ Start time: ____________ Elizebeth Koller time: ____________ Date: ____________ Movements: ____________ Start time: ____________ Elizebeth Koller time: ____________ Date: ____________ Movements: ____________ Start time: ____________ Elizebeth Koller time: ____________  Date: ____________ Movements: ____________ Start time: ____________ Elizebeth Koller time: ____________ Date: ____________ Movements: ____________ Start time: ____________ Elizebeth Koller time: ____________ Date: ____________ Movements: ____________ Start time: ____________ Elizebeth Koller time: ____________ Date: ____________ Movements: ____________ Start time: ____________ Elizebeth Koller time: ____________ Date: ____________ Movements: ____________ Start time: ____________ Elizebeth Koller time: ____________ Date: ____________ Movements: ____________ Start time: ____________ Elizebeth Koller time: ____________ Date: ____________ Movements: ____________  Start time: ____________ Elizebeth Koller time: ____________  Date: ____________ Movements: ____________ Start time: ____________ Elizebeth Koller time: ____________ Date: ____________ Movements: ____________ Start time: ____________ Elizebeth Koller time: ____________ Date: ____________ Movements: ____________ Start time: ____________ Elizebeth Koller time: ____________ Date: ____________ Movements: ____________ Start time: ____________ Elizebeth Koller time: ____________ Date: ____________ Movements: ____________ Start time: ____________ Elizebeth Koller time: ____________ Date: ____________ Movements: ____________ Start time: ____________ Elizebeth Koller time: ____________ Date: ____________ Movements: ____________ Start time: ____________ Elizebeth Koller time: ____________  Date: ____________ Movements: ____________ Start time: ____________ Elizebeth Koller time: ____________ Date: ____________ Movements: ____________ Start time: ____________ Elizebeth Koller time: ____________ Date: ____________ Movements: ____________ Start time: ____________ Elizebeth Koller time: ____________ Date: ____________ Movements: ____________ Start time: ____________ Elizebeth Koller time: ____________ Date: ____________ Movements: ____________ Start time: ____________ Elizebeth Koller time: ____________ Date: ____________ Movements: ____________ Start time: ____________ Elizebeth Koller time: ____________ Date: ____________ Movements: ____________ Start time: ____________ Elizebeth Koller time: ____________  Date: ____________ Movements: ____________ Start time: ____________ Elizebeth Koller time: ____________ Date: ____________ Movements: ____________ Start time: ____________ Elizebeth Koller time: ____________ Date: ____________ Movements: ____________ Start time: ____________ Elizebeth Koller time: ____________ Date: ____________ Movements: ____________ Start time: ____________ Elizebeth Koller time: ____________ Date: ____________ Movements: ____________ Start time: ____________ Elizebeth Koller time: ____________ Date: ____________ Movements: ____________ Start time: ____________ Elizebeth Koller time:  ____________ Document Released: 08/31/2006 Document Revised: 12/16/2013 Document Reviewed: 05/28/2012 [employer] Patient Information 2015 [place] [employer]. This information is not intended to replace advice given to you by your health care provider. Make sure you discuss any questions you have with your health care provider.

## 2014-07-16 NOTE — Progress Notes (Signed)
GBS pos. Repeat C/S scheduled 07/30/14.

## 2014-07-17 ENCOUNTER — Telehealth: Payer: Self-pay | Admitting: Obstetrics & Gynecology

## 2014-07-17 ENCOUNTER — Encounter (HOSPITAL_COMMUNITY): Payer: Self-pay | Admitting: *Deleted

## 2014-07-17 ENCOUNTER — Inpatient Hospital Stay (HOSPITAL_COMMUNITY)
Admission: AD | Admit: 2014-07-17 | Discharge: 2014-07-17 | Disposition: A | Discharge status: Home / Self Care | Payer: Medicaid Other | Source: Ambulatory Visit | Attending: Obstetrics & Gynecology | Admitting: Obstetrics & Gynecology

## 2014-07-17 DIAGNOSIS — O34219 Maternal care for unspecified type scar from previous cesarean delivery: Secondary | ICD-10-CM

## 2014-07-17 DIAGNOSIS — Z3A37 37 weeks gestation of pregnancy: Secondary | ICD-10-CM | POA: Diagnosis not present

## 2014-07-17 DIAGNOSIS — Z3493 Encounter for supervision of normal pregnancy, unspecified, third trimester: Secondary | ICD-10-CM

## 2014-07-17 MED ORDER — MORPHINE SULFATE 4 MG/ML IJ SOLN
4.0000 mg | Freq: Once | INTRAMUSCULAR | Status: AC
Start: 2014-07-17 — End: 2014-07-17
  Administered 2014-07-17: 4 mg via INTRAMUSCULAR
  Filled 2014-07-17: qty 1

## 2014-07-17 NOTE — Progress Notes (Signed)
CNM reviewed FHR. Discussed decels at 0430 and 0505. CNM gave discharge orders.

## 2014-07-17 NOTE — Discharge Instructions (Signed)
Fetal Movement Counts °Patient Name: __________________________________________________ Patient Due Date: ____________________ °Performing a fetal movement count is highly recommended in high-risk pregnancies, but it is good for every pregnant woman to do. Your health care provider may ask you to start counting fetal movements at 28 weeks of the pregnancy. Fetal movements often increase: °· After eating a full meal. °· After physical activity. °· After eating or drinking something sweet or cold. °· At rest. °Pay attention to when you feel the baby is most active. This will help you notice a pattern of your baby's sleep and wake cycles and what factors contribute to an increase in fetal movement. It is important to perform a fetal movement count at the same time each day when your baby is normally most active.  °HOW TO COUNT FETAL MOVEMENTS °1. Find a quiet and comfortable area to sit or lie down on your left side. Lying on your left side provides the best blood and oxygen circulation to your baby. °2. Write down the day and time on a sheet of paper or in a journal. °3. Start counting kicks, flutters, swishes, rolls, or jabs in a 2-hour period. You should feel at least 10 movements within 2 hours. °4. If you do not feel 10 movements in 2 hours, wait 2-3 hours and count again. Look for a change in the pattern or not enough counts in 2 hours. °SEEK MEDICAL CARE IF: °· You feel less than 10 counts in 2 hours, tried twice. °· There is no movement in over an hour. °· The pattern is changing or taking longer each day to reach 10 counts in 2 hours. °· You feel the baby is not moving as he or she usually does. °Date: ____________ Movements: ____________ Start time: ____________ Finish time: ____________  °Date: ____________ Movements: ____________ Start time: ____________ Finish time: ____________ °Date: ____________ Movements: ____________ Start time: ____________ Finish time: ____________ °Date: ____________ Movements:  ____________ Start time: ____________ Finish time: ____________ °Date: ____________ Movements: ____________ Start time: ____________ Finish time: ____________ °Date: ____________ Movements: ____________ Start time: ____________ Finish time: ____________ °Date: ____________ Movements: ____________ Start time: ____________ Finish time: ____________ °Date: ____________ Movements: ____________ Start time: ____________ Finish time: ____________  °Date: ____________ Movements: ____________ Start time: ____________ Finish time: ____________ °Date: ____________ Movements: ____________ Start time: ____________ Finish time: ____________ °Date: ____________ Movements: ____________ Start time: ____________ Finish time: ____________ °Date: ____________ Movements: ____________ Start time: ____________ Finish time: ____________ °Date: ____________ Movements: ____________ Start time: ____________ Finish time: ____________ °Date: ____________ Movements: ____________ Start time: ____________ Finish time: ____________ °Date: ____________ Movements: ____________ Start time: ____________ Finish time: ____________  °Date: ____________ Movements: ____________ Start time: ____________ Finish time: ____________ °Date: ____________ Movements: ____________ Start time: ____________ Finish time: ____________ °Date: ____________ Movements: ____________ Start time: ____________ Finish time: ____________ °Date: ____________ Movements: ____________ Start time: ____________ Finish time: ____________ °Date: ____________ Movements: ____________ Start time: ____________ Finish time: ____________ °Date: ____________ Movements: ____________ Start time: ____________ Finish time: ____________ °Date: ____________ Movements: ____________ Start time: ____________ Finish time: ____________  °Date: ____________ Movements: ____________ Start time: ____________ Finish time: ____________ °Date: ____________ Movements: ____________ Start time: ____________ Finish  time: ____________ °Date: ____________ Movements: ____________ Start time: ____________ Finish time: ____________ °Date: ____________ Movements: ____________ Start time: ____________ Finish time: ____________ °Date: ____________ Movements: ____________ Start time: ____________ Finish time: ____________ °Date: ____________ Movements: ____________ Start time: ____________ Finish time: ____________ °Date: ____________ Movements: ____________ Start time: ____________ Finish time: ____________  °Date: ____________ Movements: ____________ Start time: ____________ Finish   time: ____________ °Date: ____________ Movements: ____________ Start time: ____________ Finish time: ____________ °Date: ____________ Movements: ____________ Start time: ____________ Finish time: ____________ °Date: ____________ Movements: ____________ Start time: ____________ Finish time: ____________ °Date: ____________ Movements: ____________ Start time: ____________ Finish time: ____________ °Date: ____________ Movements: ____________ Start time: ____________ Finish time: ____________ °Date: ____________ Movements: ____________ Start time: ____________ Finish time: ____________  °Date: ____________ Movements: ____________ Start time: ____________ Finish time: ____________ °Date: ____________ Movements: ____________ Start time: ____________ Finish time: ____________ °Date: ____________ Movements: ____________ Start time: ____________ Finish time: ____________ °Date: ____________ Movements: ____________ Start time: ____________ Finish time: ____________ °Date: ____________ Movements: ____________ Start time: ____________ Finish time: ____________ °Date: ____________ Movements: ____________ Start time: ____________ Finish time: ____________ °Date: ____________ Movements: ____________ Start time: ____________ Finish time: ____________  °Date: ____________ Movements: ____________ Start time: ____________ Finish time: ____________ °Date: ____________  Movements: ____________ Start time: ____________ Finish time: ____________ °Date: ____________ Movements: ____________ Start time: ____________ Finish time: ____________ °Date: ____________ Movements: ____________ Start time: ____________ Finish time: ____________ °Date: ____________ Movements: ____________ Start time: ____________ Finish time: ____________ °Date: ____________ Movements: ____________ Start time: ____________ Finish time: ____________ °Date: ____________ Movements: ____________ Start time: ____________ Finish time: ____________  °Date: ____________ Movements: ____________ Start time: ____________ Finish time: ____________ °Date: ____________ Movements: ____________ Start time: ____________ Finish time: ____________ °Date: ____________ Movements: ____________ Start time: ____________ Finish time: ____________ °Date: ____________ Movements: ____________ Start time: ____________ Finish time: ____________ °Date: ____________ Movements: ____________ Start time: ____________ Finish time: ____________ °Date: ____________ Movements: ____________ Start time: ____________ Finish time: ____________ °Document Released: 08/31/2006 Document Revised: 12/16/2013 Document Reviewed: 05/28/2012 °[employer]® Patient Information ©2015 [employer]. This information is not intended to replace advice given to you by your health care provider. Make sure you discuss any questions you have with your health care provider. °Braxton Hicks Contractions °Contractions of the uterus can occur throughout pregnancy. Contractions are not always a sign that you are in labor.  °WHAT ARE BRAXTON HICKS CONTRACTIONS?  °Contractions that occur before labor are called Braxton Hicks contractions, or false labor. Toward the end of pregnancy (32-34 weeks), these contractions can develop more often and may become more forceful. This is not true labor because these contractions do not result in opening (dilatation) and thinning of the cervix. They  are sometimes difficult to tell apart from true labor because these contractions can be forceful and people have different pain tolerances. You should not feel embarrassed if you go to the hospital with false labor. Sometimes, the only way to tell if you are in true labor is for your health care provider to look for changes in the cervix. °If there are no prenatal problems or other health problems associated with the pregnancy, it is completely safe to be sent home with false labor and await the onset of true labor. °HOW CAN YOU TELL THE DIFFERENCE BETWEEN TRUE AND FALSE LABOR? °False Labor °· The contractions of false labor are usually shorter and not as hard as those of true labor.   °· The contractions are usually irregular.   °· The contractions are often felt in the front of the lower abdomen and in the groin.   °· The contractions may go away when you walk around or change positions while lying down.   °· The contractions get weaker and are shorter lasting as time goes on.   °· The contractions do not usually become progressively stronger, regular, and closer together as with true labor.   °True Labor °5. Contractions in true labor last 30-70 seconds, become   very regular, usually become more intense, and increase in frequency.   °6. The contractions do not go away with walking.   °7. The discomfort is usually felt in the top of the uterus and spreads to the lower abdomen and low back.   °8. True labor can be determined by your health care provider with an exam. This will show that the cervix is dilating and getting thinner.   °WHAT TO REMEMBER °· Keep up with your usual exercises and follow other instructions given by your health care provider.   °· Take medicines as directed by your health care provider.   °· Keep your regular prenatal appointments.   °· Eat and drink lightly if you think you are going into labor.   °· If Braxton Hicks contractions are making you uncomfortable:   °· Change your position from  lying down or resting to walking, or from walking to resting.   °· Sit and rest in a tub of warm water.   °· Drink 2-3 glasses of water. Dehydration may cause these contractions.   °· Do slow and deep breathing several times an hour.   °WHEN SHOULD I SEEK IMMEDIATE MEDICAL CARE? °Seek immediate medical care if: °· Your contractions become stronger, more regular, and closer together.   °· You have fluid leaking or gushing from your vagina.   °· You have a fever.   °· You pass blood-tinged mucus.   °· You have vaginal bleeding.   °· You have continuous abdominal pain.   °· You have low back pain that you never had before.   °· You feel your baby's head pushing down and causing pelvic pressure.   °· Your baby is not moving as much as it used to.   °Document Released: 08/01/2005 Document Revised: 08/06/2013 Document Reviewed: 05/13/2013 °[employer]® Patient Information ©2015 [employer]. This information is not intended to replace advice given to you by your health care provider. Make sure you discuss any questions you have with your health care provider. ° °

## 2014-07-17 NOTE — Telephone Encounter (Signed)
Patient called to inform the clinic she was still contracting, and was on her way to the hospital.

## 2014-07-17 NOTE — MAU Note (Signed)
Was on L&D last night, discharged this morning, has continued to contract.  Has had diarrhea today.  Noted decrease in fetal movement.

## 2014-07-17 NOTE — Progress Notes (Signed)
RN spoke to K. Brigitte Pulse, CNM. RN relayed to provider that patient's cervix is fingertip and thick, contracting every 2-3 minutes, and is a repeat c/s x2. Provider said to let patient ambulate for 1-2 hours, and recheck her after.

## 2014-07-23 ENCOUNTER — Ambulatory Visit (INDEPENDENT_AMBULATORY_CARE_PROVIDER_SITE_OTHER): Payer: Medicaid Other | Admitting: Physician Assistant

## 2014-07-23 VITALS — BP 112/76 | HR 92 | Temp 98.4°F | Wt 148.5 lb

## 2014-07-23 DIAGNOSIS — Z3493 Encounter for supervision of normal pregnancy, unspecified, third trimester: Secondary | ICD-10-CM

## 2014-07-23 LAB — POCT URINALYSIS DIP (DEVICE)
BILIRUBIN URINE: NEGATIVE
Glucose, UA: NEGATIVE mg/dL
Ketones, ur: NEGATIVE mg/dL
NITRITE: NEGATIVE
Protein, ur: 30 mg/dL — AB
Specific Gravity, Urine: 1.02 (ref 1.005–1.030)
UROBILINOGEN UA: 2 mg/dL — AB (ref 0.0–1.0)
pH: 6.5 (ref 5.0–8.0)

## 2014-07-23 NOTE — Progress Notes (Signed)
38 weeks and very uncomfortable with contractions.  Endorses good fetal movement.  Denies LOF, vaginal bleeding, dysuria.  She has been to [facility]U several times hoping they would keep her.   Pre-op appt discussed - 12/14 with c/s sched 12/16.  GBS positive Labor precautions discussed Urine cx pending No need to return to clinic prior to section unless problems arise.  Will need postpartum appt.

## 2014-07-23 NOTE — Patient Instructions (Signed)
Cesarean Delivery  Cesarean delivery is the birth of a baby through a cut (incision) in the abdomen and womb (uterus).  LET YOUR HEALTH CARE PROVIDER KNOW ABOUT:  All medicines you are taking, including vitamins, herbs, eye drops, creams, and over-the-counter medicines.  Previous problems you or members of your family have had with the use of anesthetics.  Any blood disorders you have.  Previous surgeries you have had.  Medical conditions you have.  Any allergies you have.  Complicationsinvolving the pregnancy. RISKS AND COMPLICATIONS  Generally, this is a safe procedure. However, as with any procedure, complications can occur. Possible complications include:  Bleeding.  Infection.  Blood clots.  Injury to surrounding organs.  Problems with anesthesia.  Injury to the baby. BEFORE THE PROCEDURE   You may be given an antacid medicine to drink. This will prevent acid contents in your stomach from going into your lungs if you vomit during the surgery.  You may be given an antibiotic medicine to prevent infection. PROCEDURE   Hair may be removed from your pubic area and your lower abdomen. This is to prevent infection in the incision site.  A tube (Foley catheter) will be placed in your bladder to drain your urine from your bladder into a bag. This keeps your bladder empty during surgery.  An IV tube will be placed in your vein.  You may be given medicine to numb the lower half of your body (regional anesthetic). If you were in labor, you may have already had an epidural in place which can be used in both labor and cesarean delivery. You may possibly be given medicine to make you sleep (general anesthetic) though this is not as common.  An incision will be made in your abdomen that extends to your uterus. There are 2 basic kinds of incisions:  The horizontal (transverse) incision. Horizontal incisions are from side to side and are used for most routine cesarean  deliveries.  The vertical incision. The vertical incision is from the top of the abdomen to the bottom and is less commonly used. It is often done for women who have a serious complication (extreme prematurity) or under emergency situations.  The horizontal and vertical incisions may both be used at the same time. However, this is very uncommon.  An incision is then made in your uterus to deliver the baby.  Your baby will then be delivered.  Both incisions are then closed with absorbable stitches. AFTER THE PROCEDURE   If you were awake during the surgery, you will see your baby right away. If you were asleep, you will see your baby as soon as you are awake.  You may breastfeed your baby after surgery.  You may be able to get up and walk the same day as the surgery. If you need to stay in bed for a period of time, you will receive help to turn, cough, and take deep breaths after surgery. This helps prevent lung problems such as pneumonia.  Do not get out of bed alone the first time after surgery. You will need help getting out of bed until you are able to do this by yourself.  You may be able to shower the day after your cesarean delivery. After the bandage (dressing) is taken off the incision site, a nurse will assist you to shower if you would like help.  You will have pneumatic compression hose placed on your lower legs. This is done to prevent blood clots. When you are up   and walking regularly, they will no longer be necessary.  Do not cross your legs when you sit.  Save any blood clots that you pass. If you pass a clot while on the toilet, do not flush it. Call for the nurse. Tell the nurse if you think you are bleeding too much or passing too many clots.  You will be given medicine as needed. Let your health care providers know if you are hurting. You may also be given an antibiotic to prevent an infection.  Your IV tube will be taken out when you are drinking a reasonable  amount of fluids. The Foley catheter is taken out when you are up and walking.  If your blood type is Rh negative and your baby's blood type is Rh positive, you will be given a shot of anti-D immune globulin. This shot prevents you from having Rh problems with a future pregnancy. You should get the shot even if you had your tubes tied (tubal ligation).  If you are allowed to take the baby for a walk, place the baby in the bassinet and push it. Do not carry your baby in your arms. Document Released: 08/01/2005 Document Revised: 05/22/2013 Document Reviewed: 02/20/2013 [employer] Patient Information 2015 [employer]. This information is not intended to replace advice given to you by your health care provider. Make sure you discuss any questions you have with your health care provider.  

## 2014-07-25 LAB — URINE CULTURE

## 2014-07-28 ENCOUNTER — Encounter (HOSPITAL_COMMUNITY)
Admission: RE | Admit: 2014-07-28 | Discharge: 2014-07-28 | Disposition: A | Discharge status: Home / Self Care | Payer: Medicaid Other | Source: Ambulatory Visit | Attending: Family Medicine | Admitting: Family Medicine

## 2014-07-28 ENCOUNTER — Encounter (HOSPITAL_COMMUNITY): Payer: Self-pay | Admitting: *Deleted

## 2014-07-28 ENCOUNTER — Inpatient Hospital Stay (HOSPITAL_COMMUNITY)
Admission: AD | Admit: 2014-07-28 | Discharge: 2014-07-28 | Disposition: A | Discharge status: Home / Self Care | Payer: Medicaid Other | Source: Ambulatory Visit | Attending: Family Medicine | Admitting: Family Medicine

## 2014-07-28 DIAGNOSIS — O471 False labor at or after 37 completed weeks of gestation: Secondary | ICD-10-CM | POA: Diagnosis not present

## 2014-07-28 DIAGNOSIS — Z87891 Personal history of nicotine dependence: Secondary | ICD-10-CM | POA: Diagnosis not present

## 2014-07-28 DIAGNOSIS — Z3A38 38 weeks gestation of pregnancy: Secondary | ICD-10-CM

## 2014-07-28 DIAGNOSIS — O479 False labor, unspecified: Secondary | ICD-10-CM

## 2014-07-28 LAB — TYPE AND SCREEN
ABO/RH(D): O POS
Antibody Screen: NEGATIVE

## 2014-07-28 LAB — CBC
HCT: 31.2 % — ABNORMAL LOW (ref 36.0–46.0)
Hemoglobin: 10 g/dL — ABNORMAL LOW (ref 12.0–15.0)
MCH: 27.1 pg (ref 26.0–34.0)
MCHC: 32.1 g/dL (ref 30.0–36.0)
MCV: 84.6 fL (ref 78.0–100.0)
PLATELETS: 274 10*3/uL (ref 150–400)
RBC: 3.69 MIL/uL — AB (ref 3.87–5.11)
RDW: 14.4 % (ref 11.5–15.5)
WBC: 12 10*3/uL — ABNORMAL HIGH (ref 4.0–10.5)

## 2014-07-28 LAB — ABO/RH: ABO/RH(D): O POS

## 2014-07-28 LAB — RPR

## 2014-07-28 MED ORDER — ZOLPIDEM TARTRATE 5 MG PO TABS: 5.0000 mg | ORAL_TABLET | Freq: Every evening | ORAL | Status: DC | PRN

## 2014-07-28 MED ORDER — LACTATED RINGERS IV BOLUS (SEPSIS)
1000.0000 mL | Freq: Once | INTRAVENOUS | Status: AC
  Administered 2014-07-28: 1000 mL via INTRAVENOUS

## 2014-07-28 NOTE — [facility] Note (Signed)
Patient presents with complaint of contractions since last night. Denies bleeding or LOF. Fetus active.

## 2014-07-28 NOTE — MAU Provider Note (Signed)
History     CSN: [id number]  Arrival date and time: 07/28/14 4008   None     Chief Complaint  Patient presents with  . Labor Eval   HPI  Crystal Jenkins is a 28 y.o. Q7Y1950 at [redacted]w[redacted]d who presents to the MAU for evaluation of contractions that started overnight. Now about 5 minutes apart. +Fetal movement. Denies loss of fluid, vaginal bleeding. She has a c-section scheduled for Wednesday.   OB History    Gravida Para Term Preterm AB TAB SAB Ectopic Multiple Living   5 2 2  0 2  1 1  2       Past Medical History  Diagnosis Date  . Cancer     cervical  . Extrauterine pregnancy   . Vertigo   . Adenocarcinoma of Bartholin's gland, stage 1   . Vaginal Pap smear, abnormal     2005; had cryo  . Headache(784.0)   . Vertigo   . Infection     UTI  . MRSA (methicillin resistant Staphylococcus aureus)     Past Surgical History  Procedure Laterality Date  . Cesarean section    . Dilation and curettage of uterus    . Cryotherapy    . Laparotomy      removal of ectopic preg    Family History  Problem Relation Age of Onset  . Hypertension Mother   . Stroke Mother   . Heart disease Mother   . Epilepsy Mother   . Heart disease Paternal Aunt   . Hypertension Maternal Grandmother   . Diabetes Maternal Grandmother   . Cancer Maternal Grandmother     History  Substance Use Topics  . Smoking status: Former Smoker    Types: Cigarettes  . Smokeless tobacco: Never Used     Comment: social somoker, quit with +preg  . Alcohol Use: No     Comment: occasionally    Allergies:  Allergies  Allergen Reactions  . Bactrim [Sulfamethoxazole-Trimethoprim] Other (See Comments)    Constipation   . Oxycodone Itching    Facility-administered medications prior to admission  Medication Dose Route Frequency Provider Last Rate Last Dose  . Tdap (BOOSTRIX) injection 0.5 mL  0.5 mL Intramuscular Once Paticia Stack, PA-C       Prescriptions prior to admission  Medication Sig  Dispense Refill Last Dose  . cyclobenzaprine (FLEXERIL) 10 MG tablet Take 0.5-1 tablets (5-10 mg total) by mouth 3 (three) times daily as needed for muscle spasms. 30 tablet 2 Past Week at Unknown time  . polyethylene glycol powder (MIRALAX) powder Take 17 g by mouth daily. (Patient taking differently: Take 17 g by mouth daily as needed. ) 255 g 0 Past Month at Unknown time  . Prenatal Vit-Fe Fumarate-FA (PRENATAL MULTIVITAMIN) TABS tablet Take 1 tablet by mouth daily at 12 noon.   07/27/2014 at Unknown time    Review of Systems  Constitutional: Negative.  Negative for fever, chills and malaise/fatigue.  HENT: Negative.  Negative for congestion and sore throat.   Eyes: Negative.  Negative for double vision and photophobia.  Respiratory: Negative.  Negative for cough, shortness of breath and wheezing.   Cardiovascular: Negative.  Negative for chest pain and leg swelling.  Gastrointestinal: Positive for abdominal pain (contractions). Negative for nausea, vomiting, diarrhea and constipation.  Genitourinary: Negative.  Negative for dysuria, urgency, frequency and hematuria.  Musculoskeletal: Negative.  Negative for myalgias.  Skin: Negative.   Neurological: Negative.  Negative for weakness and headaches.  Psychiatric/Behavioral:  Negative.   All other systems reviewed and are negative.  Physical Exam   Blood pressure 112/79, pulse 111, temperature 98.6 F (37 C), temperature source Oral, resp. rate 18, height 5\' 3"  (1.6 m), weight 152 lb 4 oz (69.06 kg), last menstrual period 10/30/2013, unknown if currently breastfeeding.  Physical Exam  Nursing note and vitals reviewed. Constitutional: She is oriented to person, place, and time. She appears well-developed and well-nourished. No distress.  HENT:  Head: Normocephalic and atraumatic.  Cardiovascular: Normal rate.   Respiratory: Effort normal.  GI: Soft. There is no tenderness. There is no rebound and no guarding.  Neurological: She is  alert and oriented to person, place, and time.  Skin: Skin is warm and dry.  Psychiatric: She has a normal mood and affect. Her behavior is normal.    MAU Course  Procedures  MDM NST reactive: 135/mod/+A/-D Toco: contractions every 6 minutes 1L NS given  Assessment and Plan  Crystal Jenkins is a 28 y.o. [id number] at [redacted]w[redacted]d here with contractions at term. Monitored for several hours and no cervical change noted. Suspect she is in early latent labor. No indication to move up her scheduled c-section date at this time. - Not in labor. C-section not indicated at this time.  - Labor precautions given.  - Return for scheduled c-section or sooner if in labor.    Shelbie Hutching 07/28/2014, 1:26 PM

## 2014-07-28 NOTE — Progress Notes (Signed)
Dr. Deniece Ree notified patient is contracting every 3-5 mins., mild. Pt. Rates ctx at a nine when she has them but states she wants to go home. Order received to bolus one liter of LR.

## 2014-07-28 NOTE — Progress Notes (Signed)
Dr. Deniece Ree notified that patient is scheduled for repeat c/s Wed am, currently contracting every 5-6 mins and VE of FT/50/High. Order received to push PO fluids.

## 2014-07-28 NOTE — Discharge Instructions (Signed)
You were seen at [facility] for contractions and were not found to be in labor at this time. You should return on Wednesday for your scheduled c-section. If you develop contractions that are increasing in frequency or intensity, vaginal bleeding, loss of fluid, or decreased fetal movement please return for evaluation sooner. Fetal Movement Counts Patient Name: __________________________________________________ Patient Due Date: ____________________ Performing a fetal movement count is highly recommended in high-risk pregnancies, but it is good for every pregnant woman to do. Your health care provider may ask you to start counting fetal movements at 28 weeks of the pregnancy. Fetal movements often increase:  After eating a full meal.  After physical activity.  After eating or drinking something sweet or cold.  At rest. Pay attention to when you feel the baby is most active. This will help you notice a pattern of your baby's sleep and wake cycles and what factors contribute to an increase in fetal movement. It is important to perform a fetal movement count at the same time each day when your baby is normally most active.  HOW TO COUNT FETAL MOVEMENTS 1. Find a quiet and comfortable area to sit or lie down on your left side. Lying on your left side provides the best blood and oxygen circulation to your baby. 2. Write down the day and time on a sheet of paper or in a journal. 3. Start counting kicks, flutters, swishes, rolls, or jabs in a 2-hour period. You should feel at least 10 movements within 2 hours. 4. If you do not feel 10 movements in 2 hours, wait 2-3 hours and count again. Look for a change in the pattern or not enough counts in 2 hours. SEEK MEDICAL CARE IF:  You feel less than 10 counts in 2 hours, tried twice.  There is no movement in over an hour.  The pattern is changing or taking longer each day to reach 10 counts in 2 hours.  You feel the baby is not moving as he or she  usually does. Date: ____________ Movements: ____________ Start time: ____________ Elizebeth Koller time: ____________  Date: ____________ Movements: ____________ Start time: ____________ Elizebeth Koller time: ____________ Date: ____________ Movements: ____________ Start time: ____________ Elizebeth Koller time: ____________ Date: ____________ Movements: ____________ Start time: ____________ Elizebeth Koller time: ____________ Date: ____________ Movements: ____________ Start time: ____________ Elizebeth Koller time: ____________ Date: ____________ Movements: ____________ Start time: ____________ Elizebeth Koller time: ____________ Date: ____________ Movements: ____________ Start time: ____________ Elizebeth Koller time: ____________ Date: ____________ Movements: ____________ Start time: ____________ Elizebeth Koller time: ____________  Date: ____________ Movements: ____________ Start time: ____________ Elizebeth Koller time: ____________ Date: ____________ Movements: ____________ Start time: ____________ Elizebeth Koller time: ____________ Date: ____________ Movements: ____________ Start time: ____________ Elizebeth Koller time: ____________ Date: ____________ Movements: ____________ Start time: ____________ Elizebeth Koller time: ____________ Date: ____________ Movements: ____________ Start time: ____________ Elizebeth Koller time: ____________ Date: ____________ Movements: ____________ Start time: ____________ Elizebeth Koller time: ____________ Date: ____________ Movements: ____________ Start time: ____________ Elizebeth Koller time: ____________  Date: ____________ Movements: ____________ Start time: ____________ Elizebeth Koller time: ____________ Date: ____________ Movements: ____________ Start time: ____________ Elizebeth Koller time: ____________ Date: ____________ Movements: ____________ Start time: ____________ Elizebeth Koller time: ____________ Date: ____________ Movements: ____________ Start time: ____________ Elizebeth Koller time: ____________ Date: ____________ Movements: ____________ Start time: ____________ Elizebeth Koller time: ____________ Date: ____________ Movements:  ____________ Start time: ____________ Elizebeth Koller time: ____________ Date: ____________ Movements: ____________ Start time: ____________ Elizebeth Koller time: ____________  Date: ____________ Movements: ____________ Start time: ____________ Elizebeth Koller time: ____________ Date: ____________ Movements: ____________ Start time: ____________ Elizebeth Koller time: ____________ Date: ____________ Movements: ____________ Start time:  ____________ Elizebeth Koller time: ____________ Date: ____________ Movements: ____________ Start time: ____________ Elizebeth Koller time: ____________ Date: ____________ Movements: ____________ Start time: ____________ Elizebeth Koller time: ____________ Date: ____________ Movements: ____________ Start time: ____________ Elizebeth Koller time: ____________ Date: ____________ Movements: ____________ Start time: ____________ Elizebeth Koller time: ____________  Date: ____________ Movements: ____________ Start time: ____________ Elizebeth Koller time: ____________ Date: ____________ Movements: ____________ Start time: ____________ Elizebeth Koller time: ____________ Date: ____________ Movements: ____________ Start time: ____________ Elizebeth Koller time: ____________ Date: ____________ Movements: ____________ Start time: ____________ Elizebeth Koller time: ____________ Date: ____________ Movements: ____________ Start time: ____________ Elizebeth Koller time: ____________ Date: ____________ Movements: ____________ Start time: ____________ Elizebeth Koller time: ____________ Date: ____________ Movements: ____________ Start time: ____________ Elizebeth Koller time: ____________  Date: ____________ Movements: ____________ Start time: ____________ Elizebeth Koller time: ____________ Date: ____________ Movements: ____________ Start time: ____________ Elizebeth Koller time: ____________ Date: ____________ Movements: ____________ Start time: ____________ Elizebeth Koller time: ____________ Date: ____________ Movements: ____________ Start time: ____________ Elizebeth Koller time: ____________ Date: ____________ Movements: ____________ Start time: ____________ Elizebeth Koller  time: ____________ Date: ____________ Movements: ____________ Start time: ____________ Elizebeth Koller time: ____________ Date: ____________ Movements: ____________ Start time: ____________ Elizebeth Koller time: ____________  Date: ____________ Movements: ____________ Start time: ____________ Elizebeth Koller time: ____________ Date: ____________ Movements: ____________ Start time: ____________ Elizebeth Koller time: ____________ Date: ____________ Movements: ____________ Start time: ____________ Elizebeth Koller time: ____________ Date: ____________ Movements: ____________ Start time: ____________ Elizebeth Koller time: ____________ Date: ____________ Movements: ____________ Start time: ____________ Elizebeth Koller time: ____________ Date: ____________ Movements: ____________ Start time: ____________ Elizebeth Koller time: ____________ Date: ____________ Movements: ____________ Start time: ____________ Elizebeth Koller time: ____________  Date: ____________ Movements: ____________ Start time: ____________ Elizebeth Koller time: ____________ Date: ____________ Movements: ____________ Start time: ____________ Elizebeth Koller time: ____________ Date: ____________ Movements: ____________ Start time: ____________ Elizebeth Koller time: ____________ Date: ____________ Movements: ____________ Start time: ____________ Elizebeth Koller time: ____________ Date: ____________ Movements: ____________ Start time: ____________ Elizebeth Koller time: ____________ Date: ____________ Movements: ____________ Start time: ____________ Elizebeth Koller time: ____________ Document Released: 08/31/2006 Document Revised: 12/16/2013 Document Reviewed: 05/28/2012 [employer] Patient Information 2015 [employer]. This information is not intended to replace advice given to you by your health care provider. Make sure you discuss any questions you have with your health care provider. Cesarean Delivery  Cesarean delivery is the birth of a baby through a cut (incision) in the abdomen and womb (uterus).  LET [facility] CARE PROVIDER KNOW ABOUT:  All medicines you are  taking, including vitamins, herbs, eye drops, creams, and over-the-counter medicines.  Previous problems you or members of your family have had with the use of anesthetics.  Any blood disorders you have.  Previous surgeries you have had.  Medical conditions you have.  Any allergies you have.  Complicationsinvolving the pregnancy. RISKS AND COMPLICATIONS  Generally, this is a safe procedure. However, as with any procedure, complications can occur. Possible complications include: 5. Bleeding. 6. Infection. 7. Blood clots. 8. Injury to surrounding organs. 9. Problems with anesthesia. 10. Injury to the baby. BEFORE THE PROCEDURE   You may be given an antacid medicine to drink. This will prevent acid contents in your stomach from going into your lungs if you vomit during the surgery.  You may be given an antibiotic medicine to prevent infection. PROCEDURE   Hair may be removed from your pubic area and your lower abdomen. This is to prevent infection in the incision site.  A tube (Foley catheter) will be placed in your bladder to drain your urine from your bladder into a bag. This keeps your bladder empty during surgery.  An IV tube will be placed in your vein.  You  may be given medicine to numb the lower half of your body (regional anesthetic). If you were in labor, you may have already had an epidural in place which can be used in both labor and cesarean delivery. You may possibly be given medicine to make you sleep (general anesthetic) though this is not as common.  An incision will be made in your abdomen that extends to your uterus. There are 2 basic kinds of incisions:  The horizontal (transverse) incision. Horizontal incisions are from side to side and are used for most routine cesarean deliveries.  The vertical incision. The vertical incision is from the top of the abdomen to the bottom and is less commonly used. It is often done for women who have a serious complication  (extreme prematurity) or under emergency situations.  The horizontal and vertical incisions may both be used at the same time. However, this is very uncommon.  An incision is then made in your uterus to deliver the baby.  Your baby will then be delivered.  Both incisions are then closed with absorbable stitches. AFTER THE PROCEDURE   If you were awake during the surgery, you will see your baby right away. If you were asleep, you will see your baby as soon as you are awake.  You may breastfeed your baby after surgery.  You may be able to get up and walk the same day as the surgery. If you need to stay in bed for a period of time, you will receive help to turn, cough, and take deep breaths after surgery. This helps prevent lung problems such as pneumonia.  Do not get out of bed alone the first time after surgery. You will need help getting out of bed until you are able to do this by yourself.  You may be able to shower the day after your cesarean delivery. After the bandage (dressing) is taken off the incision site, a nurse will assist you to shower if you would like help.  You will have pneumatic compression hose placed on your lower legs. This is done to prevent blood clots. When you are up and walking regularly, they will no longer be necessary.  Do not cross your legs when you sit.  Save any blood clots that you pass. If you pass a clot while on the toilet, do not flush it. Call for the nurse. Tell the nurse if you think you are bleeding too much or passing too many clots.  You will be given medicine as needed. Let your health care providers know if you are hurting. You may also be given an antibiotic to prevent an infection.  Your IV tube will be taken out when you are drinking a reasonable amount of fluids. The Foley catheter is taken out when you are up and walking.  If your blood type is Rh negative and your baby's blood type is Rh positive, you will be given a shot of anti-D  immune globulin. This shot prevents you from having Rh problems with a future pregnancy. You should get the shot even if you had your tubes tied (tubal ligation).  If you are allowed to take the baby for a walk, place the baby in the bassinet and push it. Do not carry your baby in your arms. Document Released: 08/01/2005 Document Revised: 05/22/2013 Document Reviewed: 02/20/2013 [facility] Patient Information 2015 [place], [place]. This information is not intended to replace advice given to you by your health care provider. Make sure you discuss any questions  you have with your health care provider.

## 2014-07-28 NOTE — MAU Note (Signed)
Urine in lab 

## 2014-07-28 NOTE — Patient Instructions (Addendum)
   Your procedure is scheduled on:DEC 16 AT 10AM  Enter through the Main Entrance of [facility] at: [place] up the phone at the desk and dial [phone] and inform us of your arrival.  Please call this number if you have any problems the morning of surgery: [phone]  Remember: Do not eat food after midnight:DEC 15 Do not drink clear liquids after: DEC 15 Take these medicines the morning of surgery with a SIP OF WATER:  Do not wear jewelry, make-up, or FINGER nail polish No metal in your hair or on your body. Do not wear lotions, powders, perfumes.  You may wear deodorant.  Do not bring valuables to the hospital. Contacts, dentures or bridgework may not be worn into surgery.  Leave suitcase in the car. After Surgery it may be brought to your room. For patients being admitted to the hospital, checkout time is 11:00am the day of discharge.    Patients discharged on the day of surgery will not be allowed to drive home.

## 2014-07-28 NOTE — MAU Note (Signed)
Patient states she has been contracting since 0100 or 0200 this am. Patient states ctx  Were close together around that time but have now spaced out. She is currently unsure of how frequent they are occuring

## 2014-07-30 ENCOUNTER — Inpatient Hospital Stay (HOSPITAL_COMMUNITY): Payer: Medicaid Other | Admitting: Certified Registered Nurse Anesthetist

## 2014-07-30 ENCOUNTER — Encounter (HOSPITAL_COMMUNITY): Payer: Self-pay | Admitting: Certified Nurse Midwife

## 2014-07-30 ENCOUNTER — Inpatient Hospital Stay (HOSPITAL_COMMUNITY)
Admission: RE | Admit: 2014-07-30 | Discharge: 2014-08-02 | DRG: 766 | Disposition: A | Discharge status: Home / Self Care | Payer: Medicaid Other | Source: Ambulatory Visit | Attending: Family Medicine | Admitting: Family Medicine

## 2014-07-30 ENCOUNTER — Encounter (HOSPITAL_COMMUNITY)
Admission: RE | Disposition: A | Discharge status: Home / Self Care | Payer: Self-pay | Source: Ambulatory Visit | Attending: Family Medicine

## 2014-07-30 DIAGNOSIS — Z881 Allergy status to other antibiotic agents status: Secondary | ICD-10-CM

## 2014-07-30 DIAGNOSIS — Z8589 Personal history of malignant neoplasm of other organs and systems: Secondary | ICD-10-CM

## 2014-07-30 DIAGNOSIS — O0913 Supervision of pregnancy with history of ectopic or molar pregnancy, third trimester: Secondary | ICD-10-CM | POA: Diagnosis not present

## 2014-07-30 DIAGNOSIS — N736 Female pelvic peritoneal adhesions (postinfective): Secondary | ICD-10-CM | POA: Diagnosis present

## 2014-07-30 DIAGNOSIS — Z820 Family history of epilepsy and other diseases of the nervous system: Secondary | ICD-10-CM

## 2014-07-30 DIAGNOSIS — Z87891 Personal history of nicotine dependence: Secondary | ICD-10-CM | POA: Diagnosis not present

## 2014-07-30 DIAGNOSIS — O3421 Maternal care for scar from previous cesarean delivery: Secondary | ICD-10-CM | POA: Diagnosis present

## 2014-07-30 DIAGNOSIS — Z888 Allergy status to other drugs, medicaments and biological substances status: Secondary | ICD-10-CM | POA: Diagnosis not present

## 2014-07-30 DIAGNOSIS — Z823 Family history of stroke: Secondary | ICD-10-CM | POA: Diagnosis not present

## 2014-07-30 DIAGNOSIS — Z98891 History of uterine scar from previous surgery: Secondary | ICD-10-CM

## 2014-07-30 DIAGNOSIS — O99824 Streptococcus B carrier state complicating childbirth: Secondary | ICD-10-CM | POA: Diagnosis present

## 2014-07-30 DIAGNOSIS — Z3A39 39 weeks gestation of pregnancy: Secondary | ICD-10-CM | POA: Diagnosis present

## 2014-07-30 DIAGNOSIS — O34219 Maternal care for unspecified type scar from previous cesarean delivery: Secondary | ICD-10-CM

## 2014-07-30 DIAGNOSIS — Z3490 Encounter for supervision of normal pregnancy, unspecified, unspecified trimester: Secondary | ICD-10-CM

## 2014-07-30 DIAGNOSIS — Z8249 Family history of ischemic heart disease and other diseases of the circulatory system: Secondary | ICD-10-CM

## 2014-07-30 LAB — HEPATITIS B SURFACE ANTIGEN: HEP B S AG: NEGATIVE

## 2014-07-30 SURGERY — Surgical Case
Anesthesia: Epidural | Site: Abdomen

## 2014-07-30 MED ORDER — KETOROLAC TROMETHAMINE 30 MG/ML IJ SOLN: 30.0000 mg | Freq: Four times a day (QID) | INTRAMUSCULAR | Status: AC | PRN

## 2014-07-30 MED ORDER — NALBUPHINE HCL 10 MG/ML IJ SOLN
5.0000 mg | INTRAMUSCULAR | Status: DC | PRN
  Administered 2014-07-31: 5 mg via INTRAVENOUS
  Filled 2014-07-30: qty 1

## 2014-07-30 MED ORDER — LANOLIN HYDROUS EX OINT: 1.0000 "application " | TOPICAL_OINTMENT | CUTANEOUS | Status: DC | PRN

## 2014-07-30 MED ORDER — PHENYLEPHRINE 8 MG IN D5W 100 ML (0.08MG/ML) PREMIX OPTIME
INJECTION | INTRAVENOUS | Status: AC
  Filled 2014-07-30: qty 100

## 2014-07-30 MED ORDER — MORPHINE SULFATE (PF) 0.5 MG/ML IJ SOLN
INTRAMUSCULAR | Status: DC | PRN
  Administered 2014-07-30: 3.5 mg via EPIDURAL

## 2014-07-30 MED ORDER — 0.9 % SODIUM CHLORIDE (POUR BTL) OPTIME
TOPICAL | Status: DC | PRN
  Administered 2014-07-30: 1000 mL

## 2014-07-30 MED ORDER — ONDANSETRON HCL 4 MG/2ML IJ SOLN
INTRAMUSCULAR | Status: DC | PRN
  Administered 2014-07-30: 4 mg via INTRAVENOUS

## 2014-07-30 MED ORDER — BUPIVACAINE HCL (PF) 0.25 % IJ SOLN
INTRAMUSCULAR | Status: AC
  Filled 2014-07-30: qty 30

## 2014-07-30 MED ORDER — SCOPOLAMINE 1 MG/3DAYS TD PT72
MEDICATED_PATCH | TRANSDERMAL | Status: AC
  Administered 2014-07-30: 1.5 mg via TRANSDERMAL
  Filled 2014-07-30: qty 1

## 2014-07-30 MED ORDER — NALBUPHINE HCL 10 MG/ML IJ SOLN: 5.0000 mg | INTRAMUSCULAR | Status: DC | PRN

## 2014-07-30 MED ORDER — CEFAZOLIN SODIUM-DEXTROSE 2-3 GM-% IV SOLR
2.0000 g | INTRAVENOUS | Status: AC
  Administered 2014-07-30: 2 g via INTRAVENOUS

## 2014-07-30 MED ORDER — LACTATED RINGERS IV SOLN
INTRAVENOUS | Status: DC
  Administered 2014-07-30 (×2): via INTRAVENOUS

## 2014-07-30 MED ORDER — LIDOCAINE-EPINEPHRINE (PF) 2 %-1:200000 IJ SOLN
INTRAMUSCULAR | Status: DC | PRN
  Administered 2014-07-30: 2 mL
  Administered 2014-07-30 (×2): 4 mL
  Administered 2014-07-30 (×2): 5 mL

## 2014-07-30 MED ORDER — CEFAZOLIN SODIUM-DEXTROSE 2-3 GM-% IV SOLR
INTRAVENOUS | Status: AC
  Filled 2014-07-30: qty 50

## 2014-07-30 MED ORDER — PROMETHAZINE HCL 25 MG/ML IJ SOLN: 6.2500 mg | INTRAMUSCULAR | Status: DC | PRN

## 2014-07-30 MED ORDER — LACTATED RINGERS IV SOLN
INTRAVENOUS | Status: DC
  Administered 2014-07-30: 21:00:00 via INTRAVENOUS

## 2014-07-30 MED ORDER — KETOROLAC TROMETHAMINE 30 MG/ML IJ SOLN
INTRAMUSCULAR | Status: AC
Start: 2014-07-30 — End: 2014-07-30
  Administered 2014-07-30: 30 mg via INTRAVENOUS
  Filled 2014-07-30: qty 1

## 2014-07-30 MED ORDER — MORPHINE SULFATE 0.5 MG/ML IJ SOLN
INTRAMUSCULAR | Status: AC
  Filled 2014-07-30: qty 10

## 2014-07-30 MED ORDER — LACTATED RINGERS IV SOLN
Freq: Once | INTRAVENOUS | Status: AC
  Administered 2014-07-30: 09:00:00 via INTRAVENOUS

## 2014-07-30 MED ORDER — LACTATED RINGERS IV SOLN
INTRAVENOUS | Status: DC | PRN
  Administered 2014-07-30: 11:00:00 via INTRAVENOUS

## 2014-07-30 MED ORDER — SIMETHICONE 80 MG PO CHEW
80.0000 mg | CHEWABLE_TABLET | ORAL | Status: DC
  Administered 2014-08-01 – 2014-08-02 (×2): 80 mg via ORAL
  Filled 2014-07-30 (×3): qty 1

## 2014-07-30 MED ORDER — HYDROMORPHONE HCL 2 MG PO TABS
2.0000 mg | ORAL_TABLET | Freq: Four times a day (QID) | ORAL | Status: DC | PRN
  Administered 2014-07-31 – 2014-08-02 (×6): 2 mg via ORAL
  Filled 2014-07-30 (×6): qty 1

## 2014-07-30 MED ORDER — SENNOSIDES-DOCUSATE SODIUM 8.6-50 MG PO TABS
2.0000 | ORAL_TABLET | ORAL | Status: DC
  Administered 2014-08-01 – 2014-08-02 (×2): 2 via ORAL
  Filled 2014-07-30 (×3): qty 2

## 2014-07-30 MED ORDER — KETOROLAC TROMETHAMINE 30 MG/ML IJ SOLN
15.0000 mg | Freq: Once | INTRAMUSCULAR | Status: AC | PRN
  Administered 2014-07-30: 30 mg via INTRAVENOUS

## 2014-07-30 MED ORDER — SCOPOLAMINE 1 MG/3DAYS TD PT72
1.0000 | MEDICATED_PATCH | Freq: Once | TRANSDERMAL | Status: DC
  Filled 2014-07-30: qty 1

## 2014-07-30 MED ORDER — TETANUS-DIPHTH-ACELL PERTUSSIS 5-2.5-18.5 LF-MCG/0.5 IM SUSP: 0.5000 mL | Freq: Once | INTRAMUSCULAR | Status: DC

## 2014-07-30 MED ORDER — NALBUPHINE HCL 10 MG/ML IJ SOLN: 5.0000 mg | Freq: Once | INTRAMUSCULAR | Status: AC | PRN

## 2014-07-30 MED ORDER — PRENATAL MULTIVITAMIN CH
1.0000 | ORAL_TABLET | Freq: Every day | ORAL | Status: DC
  Administered 2014-07-31 – 2014-08-01 (×2): 1 via ORAL
  Filled 2014-07-30 (×2): qty 1

## 2014-07-30 MED ORDER — OXYTOCIN 10 UNIT/ML IJ SOLN
INTRAMUSCULAR | Status: AC
  Filled 2014-07-30: qty 4

## 2014-07-30 MED ORDER — ONDANSETRON HCL 4 MG/2ML IJ SOLN: 4.0000 mg | Freq: Three times a day (TID) | INTRAMUSCULAR | Status: DC | PRN

## 2014-07-30 MED ORDER — DIPHENHYDRAMINE HCL 50 MG/ML IJ SOLN
12.5000 mg | INTRAMUSCULAR | Status: DC | PRN
  Administered 2014-07-30 (×2): 12.5 mg via INTRAVENOUS
  Filled 2014-07-30 (×2): qty 1

## 2014-07-30 MED ORDER — ONDANSETRON HCL 4 MG/2ML IJ SOLN
INTRAMUSCULAR | Status: AC
  Filled 2014-07-30: qty 2

## 2014-07-30 MED ORDER — DIPHENHYDRAMINE HCL 25 MG PO CAPS
25.0000 mg | ORAL_CAPSULE | ORAL | Status: DC | PRN
  Administered 2014-07-31: 25 mg via ORAL
  Filled 2014-07-30: qty 1

## 2014-07-30 MED ORDER — SCOPOLAMINE 1 MG/3DAYS TD PT72
1.0000 | MEDICATED_PATCH | Freq: Once | TRANSDERMAL | Status: DC
  Administered 2014-07-30: 1.5 mg via TRANSDERMAL

## 2014-07-30 MED ORDER — FENTANYL CITRATE 0.05 MG/ML IJ SOLN: 25.0000 ug | INTRAMUSCULAR | Status: DC | PRN

## 2014-07-30 MED ORDER — MEPERIDINE HCL 25 MG/ML IJ SOLN: 6.2500 mg | INTRAMUSCULAR | Status: DC | PRN

## 2014-07-30 MED ORDER — ONDANSETRON HCL 4 MG/2ML IJ SOLN
4.0000 mg | INTRAMUSCULAR | Status: DC | PRN
  Administered 2014-07-30 (×2): 4 mg via INTRAVENOUS
  Filled 2014-07-30 (×2): qty 2

## 2014-07-30 MED ORDER — SIMETHICONE 80 MG PO CHEW
80.0000 mg | CHEWABLE_TABLET | Freq: Three times a day (TID) | ORAL | Status: DC
  Administered 2014-07-31 – 2014-08-01 (×5): 80 mg via ORAL
  Filled 2014-07-30 (×5): qty 1

## 2014-07-30 MED ORDER — PHENYLEPHRINE 8 MG IN D5W 100 ML (0.08MG/ML) PREMIX OPTIME
INJECTION | INTRAVENOUS | Status: DC | PRN
  Administered 2014-07-30: 30 ug/min via INTRAVENOUS

## 2014-07-30 MED ORDER — OXYTOCIN 10 UNIT/ML IJ SOLN
40.0000 [IU] | INTRAMUSCULAR | Status: DC | PRN
  Administered 2014-07-30: 40 [IU] via INTRAVENOUS

## 2014-07-30 MED ORDER — IBUPROFEN 600 MG PO TABS
600.0000 mg | ORAL_TABLET | Freq: Four times a day (QID) | ORAL | Status: DC
  Administered 2014-07-31 – 2014-08-02 (×9): 600 mg via ORAL
  Filled 2014-07-30 (×10): qty 1

## 2014-07-30 MED ORDER — DIBUCAINE 1 % RE OINT: 1.0000 "application " | TOPICAL_OINTMENT | RECTAL | Status: DC | PRN

## 2014-07-30 MED ORDER — FENTANYL CITRATE 0.05 MG/ML IJ SOLN
INTRAMUSCULAR | Status: AC
  Filled 2014-07-30: qty 2

## 2014-07-30 MED ORDER — NALBUPHINE HCL 10 MG/ML IJ SOLN
5.0000 mg | Freq: Once | INTRAMUSCULAR | Status: AC | PRN
  Administered 2014-07-30: 5 mg via SUBCUTANEOUS
  Filled 2014-07-30: qty 1

## 2014-07-30 MED ORDER — BUPIVACAINE HCL (PF) 0.25 % IJ SOLN
INTRAMUSCULAR | Status: DC | PRN
  Administered 2014-07-30: 30 mL

## 2014-07-30 MED ORDER — NALOXONE HCL 0.4 MG/ML IJ SOLN: 0.4000 mg | INTRAMUSCULAR | Status: DC | PRN

## 2014-07-30 MED ORDER — SODIUM CHLORIDE 0.9 % IJ SOLN: 3.0000 mL | INTRAMUSCULAR | Status: DC | PRN

## 2014-07-30 MED ORDER — WITCH HAZEL-GLYCERIN EX PADS: 1.0000 "application " | MEDICATED_PAD | CUTANEOUS | Status: DC | PRN

## 2014-07-30 MED ORDER — ZOLPIDEM TARTRATE 5 MG PO TABS: 5.0000 mg | ORAL_TABLET | Freq: Every evening | ORAL | Status: DC | PRN

## 2014-07-30 MED ORDER — NALOXONE HCL 1 MG/ML IJ SOLN
1.0000 ug/kg/h | INTRAMUSCULAR | Status: DC | PRN
  Filled 2014-07-30: qty 2

## 2014-07-30 MED ORDER — SIMETHICONE 80 MG PO CHEW: 80.0000 mg | CHEWABLE_TABLET | ORAL | Status: DC | PRN

## 2014-07-30 MED ORDER — LACTATED RINGERS IV SOLN: INTRAVENOUS | Status: DC

## 2014-07-30 MED ORDER — ONDANSETRON HCL 4 MG PO TABS: 4.0000 mg | ORAL_TABLET | ORAL | Status: DC | PRN

## 2014-07-30 MED ORDER — DIPHENHYDRAMINE HCL 25 MG PO CAPS
25.0000 mg | ORAL_CAPSULE | Freq: Four times a day (QID) | ORAL | Status: DC | PRN
Start: 2014-07-30 — End: 2014-08-02

## 2014-07-30 MED ORDER — MENTHOL 3 MG MT LOZG: 1.0000 | LOZENGE | OROMUCOSAL | Status: DC | PRN

## 2014-07-30 MED ORDER — OXYTOCIN 40 UNITS IN LACTATED RINGERS INFUSION - SIMPLE MED: 62.5000 mL/h | INTRAVENOUS | Status: AC

## 2014-07-30 SURGICAL SUPPLY — 45 items
BARRIER ADHS 3X4 INTERCEED (GAUZE/BANDAGES/DRESSINGS) ×3 IMPLANT
BENZOIN TINCTURE PRP APPL 2/3 (GAUZE/BANDAGES/DRESSINGS) ×3 IMPLANT
CATH ROBINSON RED A/P 16FR (CATHETERS) IMPLANT
CLAMP CORD UMBIL (MISCELLANEOUS) IMPLANT
CLOSURE WOUND 1/2 X4 (GAUZE/BANDAGES/DRESSINGS) ×1
CLOTH BEACON ORANGE TIMEOUT ST (SAFETY) ×3 IMPLANT
DRAPE SHEET LG 3/4 BI-LAMINATE (DRAPES) IMPLANT
DRSG OPSITE POSTOP 4X10 (GAUZE/BANDAGES/DRESSINGS) ×3 IMPLANT
DRSG TELFA 3X8 NADH (GAUZE/BANDAGES/DRESSINGS) ×3 IMPLANT
DURAPREP 26ML APPLICATOR (WOUND CARE) ×3 IMPLANT
ELECT REM PT RETURN 9FT ADLT (ELECTROSURGICAL) ×3
ELECTRODE REM PT RTRN 9FT ADLT (ELECTROSURGICAL) ×1 IMPLANT
EXTRACTOR VACUUM M CUP 4 TUBE (SUCTIONS) IMPLANT
EXTRACTOR VACUUM M CUP 4' TUBE (SUCTIONS)
GLOVE BIO SURGEON STRL SZ 6.5 (GLOVE) ×2 IMPLANT
GLOVE BIO SURGEONS STRL SZ 6.5 (GLOVE) ×1
GLOVE BIOGEL PI IND STRL 7.0 (GLOVE) ×4 IMPLANT
GLOVE BIOGEL PI IND STRL 7.5 (GLOVE) ×2 IMPLANT
GLOVE BIOGEL PI INDICATOR 7.0 (GLOVE) ×8
GLOVE BIOGEL PI INDICATOR 7.5 (GLOVE) ×4
GLOVE ECLIPSE 7.5 STRL STRAW (GLOVE) ×3 IMPLANT
GLOVE SURG SS PI 7.0 STRL IVOR (GLOVE) ×3 IMPLANT
GOWN STRL REUS W/TWL LRG LVL3 (GOWN DISPOSABLE) ×9 IMPLANT
KIT ABG SYR 3ML LUER SLIP (SYRINGE) IMPLANT
NEEDLE HYPO 22GX1.5 SAFETY (NEEDLE) ×3 IMPLANT
NEEDLE HYPO 25X5/8 SAFETYGLIDE (NEEDLE) IMPLANT
NS IRRIG 1000ML POUR BTL (IV SOLUTION) ×3 IMPLANT
PACK C SECTION WH (CUSTOM PROCEDURE TRAY) ×3 IMPLANT
PAD OB MATERNITY 4.3X12.25 (PERSONAL CARE ITEMS) ×3 IMPLANT
RTRCTR C-SECT PINK 25CM LRG (MISCELLANEOUS) IMPLANT
STRIP CLOSURE SKIN 1/2X4 (GAUZE/BANDAGES/DRESSINGS) ×2 IMPLANT
SUT MNCRL 0 VIOLET CTX 36 (SUTURE) ×1 IMPLANT
SUT MONOCRYL 0 CTX 36 (SUTURE) ×2
SUT VIC AB 0 CT1 27 (SUTURE) ×2
SUT VIC AB 0 CT1 27XBRD ANBCTR (SUTURE) ×1 IMPLANT
SUT VIC AB 0 CTX 36 (SUTURE) ×6
SUT VIC AB 0 CTX36XBRD ANBCTRL (SUTURE) ×3 IMPLANT
SUT VIC AB 2-0 CT1 (SUTURE) ×3 IMPLANT
SUT VIC AB 2-0 CT1 27 (SUTURE) ×2
SUT VIC AB 2-0 CT1 TAPERPNT 27 (SUTURE) ×1 IMPLANT
SUT VIC AB 4-0 KS 27 (SUTURE) ×3 IMPLANT
SYR 30ML LL (SYRINGE) ×3 IMPLANT
TOWEL OR 17X24 6PK STRL BLUE (TOWEL DISPOSABLE) ×3 IMPLANT
TRAY FOLEY CATH 14FR (SET/KITS/TRAYS/PACK) ×3 IMPLANT
WATER STERILE IRR 1000ML POUR (IV SOLUTION) ×3 IMPLANT

## 2014-07-30 NOTE — Anesthesia Postprocedure Evaluation (Signed)
Anesthesia Post Note  Patient: Crystal Jenkins  Procedure(s) Performed: Procedure(s) (LRB): CESAREAN SECTION (N/A)  Anesthesia type: Spinal  Patient location: PACU  Post pain: Pain level controlled  Post assessment: Post-op Vital signs reviewed  Last Vitals:  Filed Vitals:   07/30/14 1215  BP: 103/72  Pulse: 89  Temp:   Resp: 19    Post vital signs: Reviewed  Level of consciousness: awake  Complications: No apparent anesthesia complications

## 2014-07-30 NOTE — Op Note (Addendum)
Mariavictoria D Hartstein PROCEDURE DATE: 07/30/2014  PREOPERATIVE DIAGNOSES: Intrauterine pregnancy at [redacted]w[redacted]d weeks gestation; two previous lower transverse cesarean section  POSTOPERATIVE DIAGNOSES: The same, dense uterine adhesions, direct occiput posterior  PROCEDURE: Repeat Low Transverse Cesarean Section  SURGEON:  Dr. Loma Boston  ASSISTANT:  Dr. Nila Nephew  ANESTHESIOLOGIST: Dr. Glennon Mac  INDICATIONS: Crystal Jenkins is a 28 y.o. [phone] at [redacted]w[redacted]d here for cesarean section secondary to the indications listed under preoperative diagnoses; please see preoperative note for further details.  The risks of cesarean section were discussed with the patient including but were not limited to: bleeding which may require transfusion or reoperation; infection which may require antibiotics; injury to bowel, bladder, ureters or other surrounding organs; injury to the fetus; need for additional procedures including hysterectomy in the event of a life-threatening hemorrhage; placental abnormalities wth subsequent pregnancies, incisional problems, thromboembolic phenomenon and other postoperative/anesthesia complications.   The patient concurred with the proposed plan, giving informed written consent for the procedure.    FINDINGS:  Viable female infant in cephalic presentation.  APGAR: 9, 9; weight 8 lb 6 oz (3799 g).  .  Clear amniotic fluid.  Intact placenta, three vessel cord.  Normal uterus, fallopian tubes and ovaries bilaterally.  Leotis Shames  ANESTHESIA: Epidural INTRAVENOUS FLUIDS: 2362 ml ESTIMATED BLOOD LOSS: 1100 ml URINE OUTPUT:  675 ml SPECIMENS: Placenta sent to L&D COMPLICATIONS: None immediate  PROCEDURE IN DETAIL:  The patient preoperatively received intravenous antibiotics and had sequential compression devices applied to her lower extremities.  She was then taken to the operating room where epidural anesthesia was placed and dosed up to surgical level and was found to be adequate. She was  then placed in a dorsal supine position with a leftward tilt, and prepped and draped in a sterile manner.  A foley catheter was placed into her bladder and attached to constant gravity.  After an adequate timeout was performed, a Pfannenstiel skin incision was made with scalpel and carried through to the underlying layer of fascia. The fascia was incised in the midline, and this incision was extended bilaterally using the Mayo scissors.  Kocher clamps were applied to the superior aspect of the fascial incision and the underlying rectus muscles were dissected off bluntly. A similar process was carried out on the inferior aspect of the fascial incision. The rectus muscles were separated in the midline bluntly and the peritoneum was entered bluntly. Uterine was densely adhered to the abdominal wall, uterine adhesions taken down with caution.  Attention was turned to the lower uterine segment where a low transverse hysterotomy was made with a scalpel and extended bilaterally bluntly.  The infant was successfully delivered, the cord was clamped and cut and the infant was handed over to awaiting neonatology team. Uterine massage was then administered, and the placenta delivered intact with a three-vessel cord. The uterus was then cleared of clot and debris.  The hysterotomy was closed with 0 Vicryl in a running locked fashion, and an imbricating layer was also placed with 0 Vicryl. The area of adhesions were hemostatic.  A piece of Interceed was placed as an adhesion barrier.  The pelvis was cleared of all clot and debris. Hemostasis was confirmed on all surfaces.  The peritoneum and the muscles were reapproximated using 0 Vicryl interrupted stitches. The fascia was then closed using 0 Vicryl in a running fashion.  The subcutaneous layer was irrigated, and 30 ml of 0.5% Marcaine was injected subcutaneously around the incision.  The skin was closed  with a 4-0 Vicryl subcuticular stitch. The patient tolerated the procedure  well. Sponge, lap, instrument and needle counts were correct x 2.  She was taken to the recovery room in stable condition.   Merla Riches, MD OB Fellow Faculty Practice, [place] of Attending Supervision of Obstetric Fellow During Surgery: Surgery was performed by the Obstetric Fellow under my supervision and collaboration.  I have reviewed the Obstetric Fellow's operative report, and I agree with the documentation.  I was present and scrubbed for the entire procedure.    Loma Boston, DO Attending Physician Faculty Practice, [place]

## 2014-07-30 NOTE — Anesthesia Procedure Notes (Signed)
Epidural Patient location during procedure: OB Start time: 07/30/2014 9:59 AM  Staffing Anesthesiologist: Rudean Curt Performed by: anesthesiologist   Preanesthetic Checklist Completed: patient identified, site marked, surgical consent, pre-op evaluation, timeout performed, IV checked, risks and benefits discussed and monitors and equipment checked  Epidural Patient position: sitting Prep: site prepped and draped and DuraPrep Patient monitoring: continuous pulse ox and blood pressure Approach: midline Location: L3-L4 Injection technique: LOR air  Needle:  Needle type: Tuohy  Needle gauge: 17 G Needle length: 9 cm and 9 Needle insertion depth: 5 cm cm Catheter type: closed end flexible Catheter size: 19 Gauge Catheter at skin depth: 10 cm Test dose: negative  Assessment Events: blood not aspirated, injection not painful, no injection resistance, negative IV test and no paresthesia  Additional Notes Patient identified.  Risk benefits discussed including failed block, incomplete pain control, headache, nerve damage, paralysis, blood pressure changes, nausea, vomiting, reactions to medication both toxic or allergic, and postpartum back pain.  Patient expressed understanding and wished to proceed.  All questions were answered.  Sterile technique used throughout procedure and epidural site dressed with sterile barrier dressing. No paresthesia or other complications noted.The patient did not experience any signs of intravascular injection such as tinnitus or metallic taste in mouth nor signs of intrathecal spread such as rapid motor block. Please see nursing notes for vital signs.

## 2014-07-30 NOTE — H&P (Signed)
LABOR ADMISSION HISTORY AND PHYSICAL  Crystal Jenkins is a 28 y.o. female [phone] with IUP at [redacted]w[redacted]d presenting for repeat cesarean section  Dating: By [redacted]w[redacted]d dating sono --->  Estimated Date of Delivery: 08/06/14  Prenatal History/Complications:  Past Medical History: Past Medical History  Diagnosis Date  . Cancer     cervical  . Extrauterine pregnancy   . Vertigo   . Adenocarcinoma of Bartholin's gland, stage 1   . Vaginal Pap smear, abnormal     2005; had cryo  . Headache(784.0)   . Vertigo   . Infection     UTI  . MRSA (methicillin resistant Staphylococcus aureus)     Past Surgical History: Past Surgical History  Procedure Laterality Date  . Cesarean section    . Dilation and curettage of uterus    . Cryotherapy    . Laparotomy      removal of ectopic preg    Obstetrical History: OB History    Gravida Para Term Preterm AB TAB SAB Ectopic Multiple Living   5 2 2  0 2  1 1  2       Social History: History   Social History  . Marital Status: Single    Spouse Name: N/A    Number of Children: 2  . Years of Education: Associates   Occupational History  .     Social History Main Topics  . Smoking status: Former Smoker    Types: Cigarettes  . Smokeless tobacco: Never Used     Comment: social somoker, quit with +preg  . Alcohol Use: No     Comment: occasionally  . Drug Use: No  . Sexual Activity: Yes    Birth Control/ Protection: None   Other Topics Concern  . Not on file   Social History Narrative   Patient is single, has 2 children   Patient is right handed   Education level is Associate's degree   Caffeine consumption  1 cup twice a week    Family History: Family History  Problem Relation Age of Onset  . Hypertension Mother   . Stroke Mother   . Heart disease Mother   . Epilepsy Mother   . Heart disease Paternal Aunt   . Hypertension Maternal Grandmother   . Diabetes Maternal Grandmother   . Cancer Maternal Grandmother      Allergies: Allergies  Allergen Reactions  . Bactrim [Sulfamethoxazole-Trimethoprim] Other (See Comments)    Constipation   . Oxycodone Itching    Facility-administered medications prior to admission  Medication Dose Route Frequency Provider Last Rate Last Dose  . Tdap (BOOSTRIX) injection 0.5 mL  0.5 mL Intramuscular Once Paticia Stack, PA-C       Prescriptions prior to admission  Medication Sig Dispense Refill Last Dose  . Prenatal Vit-Fe Fumarate-FA (PRENATAL MULTIVITAMIN) TABS tablet Take 1 tablet by mouth daily at 12 noon.   07/29/2014 at Unknown time  . zolpidem (AMBIEN) 5 MG tablet Take 1 tablet (5 mg total) by mouth at bedtime as needed for sleep. 5 tablet 0 07/29/2014 at Unknown time  . cyclobenzaprine (FLEXERIL) 10 MG tablet Take 0.5-1 tablets (5-10 mg total) by mouth 3 (three) times daily as needed for muscle spasms. 30 tablet 2 07/26/14  . polyethylene glycol powder (MIRALAX) powder Take 17 g by mouth daily. (Patient taking differently: Take 17 g by mouth daily as needed. ) 255 g 0 Past Month at Unknown time     Review of Systems   All  systems reviewed and negative except as stated in HPI  Blood pressure 117/83, pulse 109, temperature 98.4 F (36.9 C), temperature source Oral, resp. rate 16, last menstrual period 10/30/2013, SpO2 100 %, unknown if currently breastfeeding. General appearance: alert and cooperative Lungs: clear to auscultation bilaterally Heart: regular rate and rhythm Abdomen: soft, non-tender; bowel sounds normal Extremities: Homans sign is negative, no sign of DVT Presentation: cephalic     Prenatal labs: ABO, Rh: --/--/O POS, O POS (12/14 0945) Antibody: NEG (12/14 0945) Rubella:   RPR: NON REAC (12/14 0945)  HBsAg:    HIV: NONREACTIVE (09/30 1455)  GBS: Positive (11/25 0000)  1 hr Glucola 112 Genetic screening  Quad normal Anatomy US normal   Clinic HRC  Dating LMP consistent with [redacted]w[redacted]d CRL measurement and 13 week NT scan   Genetic Screen 1 Screen: Too late    Quad:   WNL              Anatomic [place]  Normal   GTT Early:             Third trimester: 112  TDaP vaccine  sched for 05/14/14:given  Flu vaccine  declined  GBS  Pos  Contraception Mirena-BTL  Baby Food Bottle  Circumcision  girl - Acupuncturist given  Support Person  FOB    No results found for this or any previous visit (from the past 24 hour(s)).  Patient Active Problem List   Diagnosis Date Noted  . Bartholin's cyst 04/07/2014  . Previous cesarean delivery x 2 affecting pregnancy, antepartum 12/16/2013  . Migraine with aura 09/20/2013  . Nausea and vomiting in pregnancy 03/09/2013  . Hyperemesis gravidarum 03/09/2013  . Dehydration, mild 03/09/2013  . Supervision of normal pregnancy 03/09/2013    Assessment: DAISIE HAFT is a 28 y.o. [id number] at [redacted]w[redacted]d here for scheduled repeat cesarean section, this will be pt's 3rd  #ID:  GBS + #MOF: breast #MOC:depo  The risks of cesarean section discussed with the patient included but were not limited to: bleeding which may require transfusion or reoperation; infection which may require antibiotics; injury to bowel, bladder, ureters or other surrounding organs; injury to the fetus; need for additional procedures including hysterectomy in the event of a life-threatening hemorrhage; placental abnormalities wth subsequent pregnancies, incisional problems, thromboembolic phenomenon and other postoperative/anesthesia complications. The patient concurred with the proposed plan, giving informed written consent for the procedure.  Anesthesia and OR aware. Preoperative prophylactic antibiotics and SCDs ordered on call to the OR.  To OR when ready.    Jenni Thew ROCIO 07/30/2014, 9:17 AM

## 2014-07-30 NOTE — Transfer of Care (Signed)
Immediate Anesthesia Transfer of Care Note  Patient: Crystal Jenkins  Procedure(s) Performed: Procedure(s): CESAREAN SECTION (N/A)  Patient Location: PACU  Anesthesia Type:Epidural  Level of Consciousness: awake, alert , oriented and patient cooperative  Airway & Oxygen Therapy: Patient Spontanous Breathing  Post-op Assessment: Report given to PACU RN and Post -op Vital signs reviewed and stable  Post vital signs: Reviewed and stable  Complications: No apparent anesthesia complications

## 2014-07-30 NOTE — Anesthesia Preprocedure Evaluation (Addendum)
Anesthesia Evaluation  Patient identified by MRN, date of birth, ID band Patient awake    Reviewed: Allergy & Precautions, H&P , NPO status , Patient's Chart, lab work & pertinent test results, reviewed documented beta blocker date and time   History of Anesthesia Complications Negative for: history of anesthetic complications  Airway   TM Distance: >3 FB Neck ROM: full    Dental  (+) Teeth Intact   Pulmonary neg pulmonary ROS, former smoker,  breath sounds clear to auscultation        Cardiovascular negative cardio ROS  Rhythm:regular Rate:Normal     Neuro/Psych  Headaches, vertigo negative psych ROS   GI/Hepatic negative GI ROS, Neg liver ROS,   Endo/Other  negative endocrine ROS  Renal/GU negative Renal ROS  Female GU complaint     Musculoskeletal   Abdominal   Peds  Hematology  (+) anemia ,   Anesthesia Other Findings   Reproductive/Obstetrics (+) Pregnancy (h/o C/S x2, for repeat #3)                            Anesthesia Physical Anesthesia Plan  ASA: II  Anesthesia Plan: Epidural   Post-op Pain Management:    Induction:   Airway Management Planned:   Additional Equipment:   Intra-op Plan:   Post-operative Plan:   Informed Consent: I have reviewed the patients History and Physical, chart, labs and discussed the procedure including the risks, benefits and alternatives for the proposed anesthesia with the patient or authorized representative who has indicated his/her understanding and acceptance.     Plan Discussed with: Surgeon and CRNA  Anesthesia Plan Comments:        Anesthesia Quick Evaluation

## 2014-07-31 ENCOUNTER — Encounter (HOSPITAL_COMMUNITY): Payer: Self-pay | Admitting: Family Medicine

## 2014-07-31 LAB — RUBELLA SCREEN: Rubella: 1.59 Index — ABNORMAL HIGH (ref <0.90)

## 2014-07-31 NOTE — Progress Notes (Signed)
Subjective: Postpartum Day 1: Cesarean Delivery  Foley catheter just removed, no flatus yet. Up ad lib.  Objective: Vital signs in last 24 hours: Temp:  [97.6 F (36.4 C)-99.5 F (37.5 C)] 98.5 F (36.9 C) (12/17 7972) Pulse Rate:  [74-109] 83 (12/17 0632) Resp:  [14-19] 16 (12/17 0632) BP: (99-117)/(60-83) 107/60 mmHg (12/17 0632) SpO2:  [99 %-100 %] 100 % (12/17 0459) Weight:  [68.947 kg (152 lb)] 68.947 kg (152 lb) (12/16 2050)  Physical Exam:  General: alert, cooperative and no distress Lochia: appropriate Uterine Fundus: firm Incision: healing well, no significant drainage DVT Evaluation: No evidence of DVT seen on physical exam.   Recent Labs  07/28/14 0945  HGB 10.0*  HCT 31.2*    Assessment/Plan: Status post Cesarean section. Doing well postoperatively.  Continue current care.  Lavon Paganini 07/31/2014, 7:44 AM
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
## 2014-07-31 NOTE — Anesthesia Postprocedure Evaluation (Signed)
Anesthesia Post Note  Patient: Crystal Jenkins  Procedure(s) Performed: Procedure(s) (LRB): CESAREAN SECTION (N/A)  Anesthesia type: Epidural  Patient location: Mother/Baby  Post pain: Pain level controlled  Post assessment: Post-op Vital signs reviewed  Last Vitals:  Filed Vitals:   07/31/14 0632  BP: 107/60  Pulse: 83  Temp: 36.9 C  Resp: 16    Post vital signs: Reviewed  Level of consciousness:alert  Complications: No apparent anesthesia complications
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
## 2014-07-31 NOTE — Addendum Note (Signed)
Addendum  created 07/31/14 0805 by Flossie Dibble, CRNA   Modules edited: Notes Section   Notes Section:  File: [id number]

## 2014-07-31 NOTE — Lactation Note (Signed)
This note was copied from the chart of Crystal Jenkins. Lactation Consultation Note  Patient Name: Crystal Jannet Calip SHFWY'O Date: 07/31/2014 Reason for consult: Initial assessment Baby 29 hours of life. Mom reports that she had trouble with nipple soreness with both her older children and has had nipple soreness with this baby. Mom given comfort gels with instructions. Assisted mom to latch baby more deeply and mom reports improved comfort. Mom has plenty of colostrum. Enc mom to express colostrum prior to latching baby to enc baby to open wide and latch deeply. Discussed/demonstrated waking techniques with mom. Enc mom to offer lots of STS and nurse with cues and at least every 3 hours. Enc mom to call out for assistance with latching as needed. Mom given [facility] brochure, aware of OP/BFSG, community resources, and [facility] phone line assistance after D/C. Maternal Data Reason for exclusion: Mother's choice to formula and breast feed on admission  Feeding Feeding Type: Breast Fed Length of feed: 40 min  LATCH Score/Interventions Latch: Grasps breast easily, tongue down, lips flanged, rhythmical sucking.  Audible Swallowing: Spontaneous and intermittent Intervention(s): Skin to skin;Hand expression  Type of Nipple: Everted at rest and after stimulation  Comfort (Breast/Nipple): Filling, red/small blisters or bruises, mild/mod discomfort  Problem noted: Mild/Moderate discomfort Interventions (Mild/moderate discomfort): Comfort gels;Hand expression  Hold (Positioning): Assistance needed to correctly position infant at breast and maintain latch. Intervention(s): Breastfeeding basics reviewed;Support Pillows  LATCH Score: 8  Lactation Tools Discussed/Used Tools: Comfort gels   Consult Status Consult Status: Follow-up Date: 08/01/14 Follow-up type: In-patient    Inocente Salles 07/31/2014, 3:52 PM

## 2014-08-01 NOTE — Progress Notes (Signed)
Post Partum Day 2 Subjective: no complaints, up ad lib, voiding, tolerating PO and + flatus  Objective: Blood pressure 98/52, pulse 83, temperature 97.9 F (36.6 C), temperature source Oral, resp. rate 20, height 5\' 3"  (1.6 m), weight 68.947 kg (152 lb), last menstrual period 10/30/2013, SpO2 95 %, unknown if currently breastfeeding.  Physical Exam:  General: alert, cooperative, appears stated age and no distress Lochia: appropriate Uterine Fundus: firm, remains tender Incision: healing well, no significant drainage, no dehiscence, no significant erythema, Pt still complains of significant tenderness DVT Evaluation: No evidence of DVT seen on physical exam. Negative Homan's sign.  No results for input(s): HGB, HCT in the last 72 hours.  Assessment/Plan: Plan for discharge tomorrow and Breastfeeding   LOS: 2 days   Crystal Jenkins 08/01/2014, 7:45 AM   I have seen and examined this patient and agree the above assessment. CRESENZO-DISHMAN,Crystal Jenkins 08/01/2014 8:09 PM

## 2014-08-01 NOTE — Lactation Note (Signed)
This note was copied from the chart of Crystal Jenkins. Lactation Consultation Note  Patient Name: Crystal Jenkins [id number] Date: 08/01/2014 Reason for consult: Follow-up assessment;NICU baby   Follow-up with NICU baby.  Baby was in mom's room but ended up with respiratory issues so was admitted to NICU last night.  RN called LC to assist with latching in NICU.  Mom's breasts are filling.  Reviewed hand expression with return demonstration and observation of colostrum easily expressed.  Infant rooting and showing cues to feed.  Minimal assistance from [facility] with latching on left breast in football hold; taught asymmetrical latching technique and sandwiching of breast.  Infant latched with depth, lots swallows heard, LS-8 (assisted and mom slightly tender from not pumping).  Infant fed in a consistent pattern for 15 minutes with respiratory rates 35-60 and O2Sats 99-100%.  Mom reports breast was softer after feeding.  When infant came off mom switched infant to right breast in cross-cradle hold and mom independently latched with only verbal cues from [facility].  Taught how to check for flanging of bottom lip.  Mom states the infant went to NICU last night, but has not started pumping.  Mom has not pumped at all today and current latching was first feeding (by breast) since separation.  Reviewed with mom need to pump (or breastfeed in NICU based on feeding cues) every 2 hours during day and at least once during the night for a minimum of 8 times per day for milk removal/ breast stimulation.  Reviewed supply and demand.  Encouraged breastfeeding in NICU with cues as much as mom can do.  Infant feedings are ad lib.  Anticipated discharge tomorrow (Sat) for mom but infant will likely stay longer for Antibiotics.  Mom has [place] and will need a [facility] Loaner.  [facility] Loaner paperwork given to mom and explained loaner process.  [facility] Referral faxed to [place].  Encouraged mom to pump upon returning to room on preemie setting 3-4  teardrops using hands-on pumping technique (explained to her in NICU) with hand expression at end of pumping session.  Encouraged to call for further assistance or questions as needed.      Maternal Data Reason for exclusion: Admission to Intensive Care Unit (ICU) post-partum  Feeding Feeding Type: Breast Fed Length of feed: 30 min (15 minutes on each side)  LATCH Score/Interventions Latch: Grasps breast easily, tongue down, lips flanged, rhythmical sucking.  Audible Swallowing: Spontaneous and intermittent  Type of Nipple: Everted at rest and after stimulation  Comfort (Breast/Nipple): Filling, red/small blisters or bruises, mild/mod discomfort  Problem noted: Mild/Moderate discomfort Interventions (Mild/moderate discomfort): Hand massage;Hand expression  Hold (Positioning): Assistance needed to correctly position infant at breast and maintain latch. Intervention(s): Breastfeeding basics reviewed;Support Pillows;Position options;Skin to skin  LATCH Score: 8  Lactation Tools Discussed/Used WIC Program: Yes Pump Review: Setup, frequency, and cleaning   Consult Status Consult Status: Follow-up Date: 08/02/14 Follow-up type: In-patient    Merlene Laughter 08/01/2014, 5:40 PM

## 2014-08-02 ENCOUNTER — Ambulatory Visit: Payer: Self-pay

## 2014-08-02 DIAGNOSIS — Z98891 History of uterine scar from previous surgery: Secondary | ICD-10-CM

## 2014-08-02 DIAGNOSIS — Z3490 Encounter for supervision of normal pregnancy, unspecified, unspecified trimester: Secondary | ICD-10-CM

## 2014-08-02 MED ORDER — IBUPROFEN 600 MG PO TABS: 600.0000 mg | ORAL_TABLET | Freq: Four times a day (QID) | ORAL | Status: DC

## 2014-08-02 MED ORDER — HYDROCODONE-ACETAMINOPHEN 5-325 MG PO TABS: ORAL_TABLET | ORAL | Status: DC

## 2014-08-02 NOTE — Lactation Note (Signed)
This note was copied from the chart of Crystal Autumnrose Yore. Lactation Consultation Note   Follow up consult with this mom of a NICU baby, now 75 hours old, and full term, in the nICU for infection/antibiotics. I assisted mom with latching the baby in football hold. The baby latched easily and deeply, suckled well for 15 minutes, with lots of audible swallows. I loaned mom a Engineer, materials, DEP. I lgave mom ice for very full/engorged breast, and had her pump. She expressed about 60-90 mls of milk, reports feeling much better. Engorgement care reviewed with mom, . I told mom lactation would continue to work with her and the baby, in the NICU. Mom was only able to breast feed her first 2 children for about a week. i told mom that i hope she will breast feed this baby much longer. Mom seemed pleased with how her baby did, and relieved that she was feeling somewhat better.  Patient Name: Crystal Jenkins [id number] Date: 08/02/2014 Reason for consult: Follow-up assessment   Maternal Data    Feeding Feeding Type: Breast Fed Length of feed: 15 min  LATCH Score/Interventions Latch: Grasps breast easily, tongue down, lips flanged, rhythmical sucking. Intervention(s): Skin to skin  Audible Swallowing: Spontaneous and intermittent Intervention(s): Skin to skin  Type of Nipple: Everted at rest and after stimulation  Comfort (Breast/Nipple): Engorged, cracked, bleeding, large blisters, severe discomfort Problem noted: Engorgment Intervention(s): Ice;Hand expression;Reverse pressure;Cabbage leaves  Problem noted: Mild/Moderate discomfort  Hold (Positioning): Assistance needed to correctly position infant at breast and maintain latch. Intervention(s): Breastfeeding basics reviewed;Support Pillows;Position options;Skin to skin  LATCH Score: 7  Lactation Tools Discussed/Used WIC Program: Yes Pump Review: Setup, frequency, and cleaning;Milk Storage   Consult Status Consult Status:  Follow-up Follow-up type: In-patient (PRN NICU)    Tonna Corner 08/02/2014, 4:56 PM

## 2014-08-02 NOTE — Discharge Summary (Signed)
Obstetric Discharge Summary Reason for Admission: cesarean section Prenatal Procedures: none Intrapartum Procedures: cesarean: low cervical, transverse Postpartum Procedures: none Complications-Operative and Postpartum: none HEMOGLOBIN  Date Value Ref Range Status  07/28/2014 10.0* 12.0 - 15.0 g/dL Final   HCT  Date Value Ref Range Status  07/28/2014 31.2* 36.0 - 46.0 % Final   Crystal Jenkins is a 28 y.o. female [id number] who was admitted at  [redacted]w[redacted]d for repeat cesarean section.    Physical Exam:  General: alert, cooperative, appears stated age and no distress Lochia: appropriate Uterine Fundus: firm Incision: healing well, no significant drainage, no dehiscence, no significant erythema DVT Evaluation: No evidence of DVT seen on physical exam. Negative Homan's sign.  Discharge Diagnoses: Term Pregnancy-delivered  Discharge Information: Date: 08/02/2014 Activity: pelvic rest Diet: routine Medications: Ibuprofen and Norco 5/325 Condition: stable Instructions: refer to practice specific booklet Discharge to: home Follow-up Information    Follow up with [facility] In 2 weeks.   Specialty:  Obstetrics and Gynecology   Why:  For wound re-check   Contact information:   [place] [phone]      Newborn Data: Live born female  Birth Weight: 8 lb 6 oz (3799 g) APGAR: 9, 9  Home with mother pending NICU release  Remus Blake 08/02/2014, 7:48 AM   Pt had already been d/c'd from hospital when I came to round at 1000. Did not get to co-exam prior to her d/c.  Roma Schanz, CNM, [facility] 08/02/2014 10:03 AM

## 2014-08-02 NOTE — Plan of Care (Signed)
Problem: Discharge Progression Outcomes Goal: Pain controlled with appropriate interventions Outcome: Completed/Met Date Met:  08/02/14 Good pain control on po Motrin and Dilaudid po.     

## 2014-08-02 NOTE — Discharge Instructions (Signed)

## 2014-09-03 ENCOUNTER — Encounter: Payer: Self-pay | Admitting: Obstetrics & Gynecology

## 2014-09-03 ENCOUNTER — Ambulatory Visit (INDEPENDENT_AMBULATORY_CARE_PROVIDER_SITE_OTHER): Payer: Medicaid Other | Admitting: Obstetrics & Gynecology

## 2014-09-03 DIAGNOSIS — Z3042 Encounter for surveillance of injectable contraceptive: Secondary | ICD-10-CM

## 2014-09-03 LAB — POCT PREGNANCY, URINE: PREG TEST UR: NEGATIVE

## 2014-09-03 MED ORDER — MEDROXYPROGESTERONE ACETATE 104 MG/0.65ML ~~LOC~~ SUSP: 104.0000 mg | SUBCUTANEOUS | Status: DC

## 2014-09-03 MED ORDER — MEDROXYPROGESTERONE ACETATE 104 MG/0.65ML ~~LOC~~ SUSP
104.0000 mg | Freq: Once | SUBCUTANEOUS | Status: AC
  Administered 2014-09-03: 104 mg via SUBCUTANEOUS

## 2014-09-03 NOTE — Progress Notes (Signed)
Pt states that she has been having headaches. She denies visual disturbances.

## 2014-09-03 NOTE — Progress Notes (Signed)
Patient ID: Crystal Jenkins, female   DOB: 1986/01/07, 29 y.o.   MRN: [id number] Subjective:     Crystal Jenkins is a 29 y.o. female who presents for a postpartum visit. She is 4 weeks postpartum following a low cervical transverse Cesarean section. I have fully reviewed the prenatal and intrapartum course. The delivery was at 24 gestational weeks. Outcome: repeat cesarean section, low transverse incision. Anesthesia: spinal. Postpartum course has been uncomplicated. Baby's course has been without problem. Baby is feeding by bottle. Bleeding no bleeding. Bowel function is normal. Bladder function is normal. Patient is not sexually active. Contraception method is abstinence. Postpartum depression screening: negative. Pt with a mild HA relieved with Motrin.  The following portions of the patient's history were reviewed and updated as appropriate: allergies, current medications, past family history, past medical history, past social history, past surgical history and problem list.  Review of Systems A comprehensive review of systems was negative.   Objective:    BP 137/94 mmHg  Pulse 76  Temp(Src) 99 F (37.2 C) (Oral)  Resp 20  Ht 5\' 2"  (1.575 m)  Wt 131 lb 11.2 oz (59.739 kg)  BMI 24.08 kg/m2  Breastfeeding? Yes       Abd: soft,.NT, ND incision: clean, dry and intact  Assessment:     4 postpartum exam. Pap smear not done at today's visit.   Elevated BP- not noted prior to today.     Plan:    1. Contraception: Depo-Provera injections 2.  Follow up in: 2 weeks or as needed. For BP check

## 2014-09-12 ENCOUNTER — Encounter: Payer: Self-pay | Admitting: Obstetrics & Gynecology

## 2014-09-12 ENCOUNTER — Ambulatory Visit (INDEPENDENT_AMBULATORY_CARE_PROVIDER_SITE_OTHER): Payer: Medicaid Other | Admitting: Obstetrics & Gynecology

## 2014-09-12 VITALS — BP 132/92 | HR 111 | Temp 98.6°F | Ht 62.0 in | Wt 129.8 lb

## 2014-09-12 DIAGNOSIS — N750 Cyst of Bartholin's gland: Secondary | ICD-10-CM

## 2014-09-12 DIAGNOSIS — Z98891 History of uterine scar from previous surgery: Secondary | ICD-10-CM

## 2014-09-12 MED ORDER — CYCLOBENZAPRINE HCL 10 MG PO TABS: 5.0000 mg | ORAL_TABLET | Freq: Three times a day (TID) | ORAL | Status: DC | PRN

## 2014-09-12 MED ORDER — DOXYCYCLINE HYCLATE 50 MG PO TABS: 50.0000 mg | ORAL_TABLET | Freq: Two times a day (BID) | ORAL | Status: DC

## 2014-09-12 NOTE — Patient Instructions (Signed)
Bartholin's Cyst or Abscess °Bartholin's glands are small glands located within the folds of skin (labia) along the sides of the lower opening of the vagina (birth canal). A cyst may develop when the duct of the gland becomes blocked. When this happens, fluid that accumulates within the cyst can become infected. This is known as an abscess. The Bartholin gland produces a mucous fluid to lubricate the outside of the vagina during sexual intercourse. °SYMPTOMS  °· Patients with a small cyst may not have any symptoms. °· Mild discomfort to severe pain depending on the size of the cyst and if it is infected (abscess). °· Pain, redness, and swelling around the lower opening of the vagina. °· Painful intercourse. °· Pressure in the perineal area. °· Swelling of the lips of the vagina (labia). °· The cyst or abscess can be on one side or both sides of the vagina. °DIAGNOSIS  °· A large swelling is seen in the lower vagina area by your caregiver. °· Painful to touch. °· Redness and pain, if it is an abscess. °TREATMENT  °· Sometimes the cyst will go away on its own. °· Apply warm wet compresses to the area or take hot sitz baths several times a day. °· An incision to drain the cyst or abscess with local anesthesia. °· Culture the pus, if it is an abscess. °· Antibiotic treatment, if it is an abscess. °· Cut open the gland and suture the edges to make the opening of the gland bigger (marsupialization). °· Remove the whole gland if the cyst or abscess returns. °PREVENTION  °· Practice good hygiene. °· Clean the vaginal area with a mild soap and soft cloth when bathing. °· Do not rub hard in the vaginal area when bathing. °· Protect the crotch area with a padded cushion if you take long bike rides or ride horses. °· Be sure you are well lubricated when you have sexual intercourse. °HOME CARE INSTRUCTIONS  °· If your cyst or abscess was opened, a small piece of gauze, or a drain, may have been placed in the wound to allow  drainage. Do not remove this gauze or drain unless directed by your caregiver. °· Wear feminine pads, not tampons, as needed for any drainage or bleeding. °· If antibiotics were prescribed, take them exactly as directed. Finish the entire course. °· Only take over-the-counter or prescription medicines for pain, discomfort, or fever as directed by your caregiver. °SEEK IMMEDIATE MEDICAL CARE IF:  °· You have an increase in pain, redness, swelling, or drainage. °· You have bleeding from the wound which results in the use of more than the number of pads suggested by your caregiver in 24 hours. °· You have chills. °· You have a fever. °· You develop any new problems (symptoms) or aggravation of your existing condition. °MAKE SURE YOU:  °· Understand these instructions. °· Will watch your condition. °· Will get help right away if you are not doing well or get worse. °Document Released: 08/01/2005 Document Revised: 10/24/2011 Document Reviewed: 03/19/2008 °ExitCare® Patient Information ©2015 [employer]. This information is not intended to replace advice given to you by your health care provider. Make sure you discuss any questions you have with your health care provider. ° °

## 2014-09-12 NOTE — Progress Notes (Signed)
   Subjective:    Patient ID: Crystal Jenkins, female    DOB: [birth year], 29 y.o.   MRN: [id number]  HPI  Shakaria D Rocco is here for blood pressure check up and bartholin gland cyst.   Blood pressure: 5 weeks post partum from repeat low transverse c-section. No complications during pregnancy or delivery. Is having some headaches but no visual disturbances. She is not taking any anti-hypertensive.    Bartholin cyst: started occuring about 3 years ago. They occur intermittently every 2 months. They occur on both labia. She has had them drained in the past. The last catheter placed was in November and fell out prior to her c-section. She denies any fever, chills, night sweats. She denies any cyst's or boils occuring anywhere else on her body. She had one occur on her right labia and it burst yesterday morning. It has been draining since. They are painful up to a 10/10 and throbbing in nature.   Review of Systems See HPI     Objective:   Physical Exam BP 132/92 mmHg  Pulse 111  Temp(Src) 98.6 F (37 C)  Ht 5\' 2"  (1.575 m)  Wt 129 lb 12.8 oz (58.877 kg)  BMI 23.73 kg/m2  LMP 09/11/2014  Breastfeeding? No Gen: NAD, alert, cooperative with exam, well-appearing CV: tachycardic Pulm: no extra effort of breathing  GU: > External: draining cyst on right labia > Vagina: no blood in vault     Assessment & Plan:  1. Elevated Blood pressure   Having headaches but has a history of migraines. Blood pressure only mildly elevated today. Would encourage at least once weekly home monitoring prior to initiating treatment. May need to consider initiating treatment for migraines/headaches.   2. 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 Due to previous use of catheters multiple times, will try low dose antibiotics for suppression.  - given depo on 09/03/14, bottle feeding  - doxycycline 50 mg BID #60 2 refills  - will refill ABX once she receives her depo    - may need to f/u after three months to determine if working   3. 5 weeks postpartum S/p c-section   Refilled flexeril   Rosemarie Ax, MD PGY-2, [facility] 09/12/2014, 10:17 AM  I participated in evaluation and I agree with the resident's note  Woodroe Mode, MD 09/12/2014

## 2014-09-12 NOTE — Progress Notes (Signed)
Here for blood pressure check, had elevated blood pressure at postpartum visit. Also needs her bartolin cyst checked- is causing a lot of pain at times- wants to siscuss surgical intervention. Also c/o dizziness at times and headaches- took ibuprofen with relief.

## 2014-11-24 ENCOUNTER — Ambulatory Visit (INDEPENDENT_AMBULATORY_CARE_PROVIDER_SITE_OTHER): Payer: Medicaid Other | Admitting: General Practice

## 2014-11-24 VITALS — BP 123/88 | HR 71 | Temp 98.4°F | Ht 62.0 in | Wt 137.4 lb

## 2014-11-24 DIAGNOSIS — Z3042 Encounter for surveillance of injectable contraceptive: Secondary | ICD-10-CM

## 2014-11-24 MED ORDER — MEDROXYPROGESTERONE ACETATE 104 MG/0.65ML ~~LOC~~ SUSP
104.0000 mg | Freq: Once | SUBCUTANEOUS | Status: AC
  Administered 2014-11-24: 104 mg via SUBCUTANEOUS

## 2014-11-24 NOTE — Progress Notes (Signed)
Patient here for second depo today. Patient states she has had bleeding for 3 weeks. Discussed with patient that can be normal when starting depo but she should start to see improvement after this injection, definitely by her next dose. Patient verbalized understanding and states her sex drive has decreased significantly as well. Discussed with patient that sex drive is associated with many things including relationship status, stress, and emotions. Told patient hormones like depo can influence sex drive as well. Patient verbalized understanding and has no other questions

## 2015-02-18 ENCOUNTER — Ambulatory Visit: Payer: Medicaid Other

## 2015-02-19 ENCOUNTER — Ambulatory Visit: Payer: Medicaid Other

## 2015-02-25 ENCOUNTER — Ambulatory Visit (INDEPENDENT_AMBULATORY_CARE_PROVIDER_SITE_OTHER): Payer: Medicaid Other | Admitting: *Deleted

## 2015-02-25 DIAGNOSIS — Z3042 Encounter for surveillance of injectable contraceptive: Secondary | ICD-10-CM | POA: Diagnosis present

## 2015-02-25 MED ORDER — MEDROXYPROGESTERONE ACETATE 150 MG/ML IM SUSP
150.0000 mg | Freq: Once | INTRAMUSCULAR | Status: AC
  Administered 2015-02-25: 150 mg via INTRAMUSCULAR

## 2015-04-22 ENCOUNTER — Ambulatory Visit: Payer: Medicaid Other | Admitting: Internal Medicine

## 2015-04-22 ENCOUNTER — Encounter (HOSPITAL_COMMUNITY): Payer: Self-pay | Admitting: Emergency Medicine

## 2015-04-22 ENCOUNTER — Emergency Department (HOSPITAL_COMMUNITY)
Admission: EM | Admit: 2015-04-22 | Discharge: 2015-04-22 | Disposition: A | Discharge status: Home / Self Care | Payer: Medicaid Other | Attending: Emergency Medicine | Admitting: Emergency Medicine

## 2015-04-22 DIAGNOSIS — Z79899 Other long term (current) drug therapy: Secondary | ICD-10-CM | POA: Insufficient documentation

## 2015-04-22 DIAGNOSIS — Z85858 Personal history of malignant neoplasm of other endocrine glands: Secondary | ICD-10-CM | POA: Insufficient documentation

## 2015-04-22 DIAGNOSIS — Z793 Long term (current) use of hormonal contraceptives: Secondary | ICD-10-CM | POA: Insufficient documentation

## 2015-04-22 DIAGNOSIS — N764 Abscess of vulva: Secondary | ICD-10-CM | POA: Insufficient documentation

## 2015-04-22 DIAGNOSIS — Z8614 Personal history of Methicillin resistant Staphylococcus aureus infection: Secondary | ICD-10-CM | POA: Insufficient documentation

## 2015-04-22 DIAGNOSIS — Z8541 Personal history of malignant neoplasm of cervix uteri: Secondary | ICD-10-CM | POA: Insufficient documentation

## 2015-04-22 DIAGNOSIS — Z87891 Personal history of nicotine dependence: Secondary | ICD-10-CM | POA: Insufficient documentation

## 2015-04-22 MED ORDER — IBUPROFEN 600 MG PO TABS: 600.0000 mg | ORAL_TABLET | Freq: Three times a day (TID) | ORAL | Status: DC | PRN

## 2015-04-22 MED ORDER — LIDOCAINE-EPINEPHRINE (PF) 2 %-1:200000 IJ SOLN
20.0000 mL | Freq: Once | INTRAMUSCULAR | Status: AC
  Administered 2015-04-22: 20 mL
  Filled 2015-04-22: qty 20

## 2015-04-22 MED ORDER — DOXYCYCLINE HYCLATE 100 MG PO CAPS: 100.0000 mg | ORAL_CAPSULE | Freq: Two times a day (BID) | ORAL | Status: DC

## 2015-04-22 MED ORDER — HYDROCODONE-ACETAMINOPHEN 5-325 MG PO TABS
1.0000 | ORAL_TABLET | Freq: Once | ORAL | Status: AC
  Administered 2015-04-22: 1 via ORAL
  Filled 2015-04-22: qty 1

## 2015-04-22 MED ORDER — DOXYCYCLINE HYCLATE 100 MG PO TABS: 100.0000 mg | ORAL_TABLET | Freq: Once | ORAL | Status: DC

## 2015-04-22 NOTE — Discharge Instructions (Signed)

## 2015-04-22 NOTE — ED Notes (Signed)
Patient coming from home with c/o of worsening cyst pain in the vaginal area that has worsened in the past couple of days that has been ongoing x 2 years.  Pain 10/10.  Patient is on Doxycycline and Ibuprofen.

## 2015-04-22 NOTE — ED Notes (Signed)
This RN at bedside with Dr. Venora Maples during lansing of cyst in the vaginal area.

## 2015-04-22 NOTE — ED Notes (Signed)
I&D tray at bedside.

## 2015-04-23 ENCOUNTER — Encounter: Payer: Self-pay | Admitting: Family Medicine

## 2015-04-23 NOTE — ED Provider Notes (Signed)
CSN: [id number]     Arrival date & time 04/22/15  0845 History   First MD Initiated Contact with Patient 04/22/15 [phone]     Chief Complaint  Patient presents with  . Cyst    vaginal area      HPI Patient reports increasing discomfort of her left labia over the past 2-3 days.  She's had intermittent issues with swelling of her labia before.  No prior history of incision and drainage.  She reports she's never been seen for this before.  Right now she reports significant pain and swelling of her left labia without vaginal discharge.  No fevers or chills.  She reports pain to palpation and pain when she moves her legs and ambulates.  She is most comfortable when she lies still.   Past Medical History  Diagnosis Date  . Cancer     cervical  . Extrauterine pregnancy   . Vertigo   . Adenocarcinoma of Bartholin's gland, stage 1   . Vaginal Pap smear, abnormal     2005; had cryo  . Headache(784.0)   . Vertigo   . Infection     UTI  . MRSA (methicillin resistant Staphylococcus aureus)    Past Surgical History  Procedure Laterality Date  . Cesarean section    . Dilation and curettage of uterus    . Cryotherapy    . Laparotomy      removal of ectopic preg  . Cesarean section N/A 07/30/2014    Procedure: CESAREAN SECTION;  Surgeon: Truett Mainland, DO;  Location: [facility];  Service: Obstetrics;  Laterality: N/A;   Family History  Problem Relation Age of Onset  . Hypertension Mother   . Stroke Mother   . Heart disease Mother   . Epilepsy Mother   . Heart disease Paternal Aunt   . Hypertension Maternal Grandmother   . Diabetes Maternal Grandmother   . Cancer Maternal Grandmother    Social History  Substance Use Topics  . Smoking status: Former Smoker    Types: Cigarettes    Quit date: 11/11/2013  . Smokeless tobacco: Never Used     Comment: social somoker, quit with +preg  . Alcohol Use: No     Comment: occasionally   OB History    Gravida Para Term Preterm AB TAB SAB  Ectopic Multiple Living   5 3 3  0 2  1 1  0 3     Review of Systems  All other systems reviewed and are negative.     Allergies  Bactrim and Oxycodone  Home Medications   Prior to Admission medications   Medication Sig Start Date End Date Taking? Authorizing Provider  cyclobenzaprine (FLEXERIL) 10 MG tablet Take 0.5-1 tablets (5-10 mg total) by mouth 3 (three) times daily as needed for muscle spasms. 09/12/14  Yes Rosemarie Ax, MD  medroxyPROGESTERone (DEPO-SUBQ PROVERA 104) 104 MG/0.65ML injection Inject 0.65 mLs (104 mg total) into the skin every 3 (three) months. 09/03/14  Yes Lavonia Drafts, MD  polyethylene glycol powder (MIRALAX) powder Take 17 g by mouth daily. Patient taking differently: Take 17 g by mouth daily as needed.  06/17/14  Yes Shelbie Hutching, MD  zolpidem (AMBIEN) 5 MG tablet Take 5 mg by mouth at bedtime as needed. for sleep 07/28/14  Yes Historical Provider, MD  doxycycline (VIBRAMYCIN) 100 MG capsule Take 1 capsule (100 mg total) by mouth 2 (two) times daily. 04/22/15   Jola Schmidt, MD  ibuprofen (ADVIL,MOTRIN) 600 MG tablet Take 1 tablet (  600 mg total) by mouth every 8 (eight) hours as needed. 04/22/15   Jola Schmidt, MD   BP 109/81 mmHg  Pulse 71  Temp(Src) 99.3 F (37.4 C) (Oral)  Ht 5\' 2"  (1.575 m)  Wt 140 lb (63.504 kg)  BMI 25.60 kg/m2  SpO2 97% Physical Exam  Constitutional: She is oriented to person, place, and time. She appears well-developed and well-nourished. No distress.  HENT:  Head: Normocephalic and atraumatic.  Eyes: EOM are normal.  Neck: Normal range of motion.  Cardiovascular: Normal rate, regular rhythm and normal heart sounds.   Pulmonary/Chest: Effort normal and breath sounds normal.  Abdominal: Soft. She exhibits no distension. There is no tenderness.  Genitourinary:  Large left labial abscess without spreading cellulitis.  Her perineum is soft without crepitus.  There is fluctuance and pointing of this abscess without  drainage at this time.  Musculoskeletal: Normal range of motion.  Neurological: She is alert and oriented to person, place, and time.  Skin: Skin is warm and dry.  Psychiatric: She has a normal mood and affect. Judgment normal.  Nursing note and vitals reviewed.   ED Course  Procedures (including critical care time)  INCISION AND DRAINAGE Performed by: Hoy Morn Consent: Verbal consent obtained. Risks and benefits: risks, benefits and alternatives were discussed Time out performed prior to procedure Type: abscess Body area: left labia Anesthesia: local infiltration Incision was made with a scalpel. Local anesthetic: lidocaine 2% with epinephrine Anesthetic total: 5 ml Complexity: complex Blunt dissection to break up loculations Drainage: purulent Drainage amount: large Packing material: none Patient tolerance: Patient tolerated the procedure well with no immediate complications.     Labs Review Labs Reviewed - No data to display  Imaging Review No results found. I have personally reviewed and evaluated these images and lab results as part of my medical decision-making.   EKG Interpretation None      MDM   Final diagnoses:  Labial abscess    Large amount of pus was removed.  The patient feels much better this time.  Home with antibiotics and instructions for warm dial soap soaks over the next week.  Patient understands return the ER for new or worsening symptoms.  Overall well-appearing.  Nontoxic.    Jola Schmidt, MD 04/23/15 [phone]

## 2015-05-14 ENCOUNTER — Ambulatory Visit: Payer: Medicaid Other | Admitting: *Deleted

## 2015-05-18 ENCOUNTER — Ambulatory Visit (INDEPENDENT_AMBULATORY_CARE_PROVIDER_SITE_OTHER): Payer: Medicaid Other | Admitting: General Practice

## 2015-05-18 VITALS — BP 127/95 | HR 73 | Temp 98.6°F | Ht 62.0 in | Wt 142.7 lb

## 2015-05-18 DIAGNOSIS — Z3042 Encounter for surveillance of injectable contraceptive: Secondary | ICD-10-CM

## 2015-05-18 MED ORDER — MEDROXYPROGESTERONE ACETATE 150 MG/ML IM SUSP
150.0000 mg | Freq: Once | INTRAMUSCULAR | Status: AC
  Administered 2015-05-18: 150 mg via INTRAMUSCULAR

## 2015-06-01 ENCOUNTER — Encounter: Payer: Self-pay | Admitting: Obstetrics and Gynecology

## 2015-06-01 ENCOUNTER — Ambulatory Visit (INDEPENDENT_AMBULATORY_CARE_PROVIDER_SITE_OTHER): Payer: Self-pay | Admitting: Obstetrics and Gynecology

## 2015-06-01 VITALS — BP 121/80 | HR 86 | Wt 141.7 lb

## 2015-06-01 DIAGNOSIS — N764 Abscess of vulva: Secondary | ICD-10-CM

## 2015-06-01 DIAGNOSIS — N938 Other specified abnormal uterine and vaginal bleeding: Secondary | ICD-10-CM

## 2015-06-01 NOTE — Patient Instructions (Signed)
Bartholin Cyst or Abscess A Bartholin cyst is a fluid-filled sac that forms on a Bartholin gland. Bartholin glands are small glands that are found in the folds of skin (labia) on the sides of the lower opening of the vagina. This type of cyst causes a bulge on the side of the vagina. A cyst that is not large or infected may not cause problems. However, if the fluid in the cyst becomes infected, the cyst can turn into an abscess. An abscess may cause discomfort or pain. HOME CARE  Take medicines only as told by your doctor.  If you were prescribed an antibiotic medicine, finish all of it even if you start to feel better.  Apply warm, wet compresses to the area or take warm, shallow baths that cover your pelvic area (sitz baths). Do this many times each day or as told by your doctor.  Do not squeeze the cyst. Do not apply heavy pressure to it.  Do not have sex until the cyst has gone away.  If your cyst or abscess was opened by your doctor, a small piece of gauze or a drain may have been placed in the area. That lets the cyst drain. Do not remove the gauze or the drain until your doctor tells you it is okay to do that.  Do not wear tampons. Wear feminine pads as needed for any fluid or blood.  Keep all follow-up visits as told by your doctor. This is important. GET HELP IF:  Your pain, puffiness (swelling), or redness in the area of the cyst gets worse.  You have fluid or pus pus coming from the cyst.  You have a fever.   This information is not intended to replace advice given to you by your health care provider. Make sure you discuss any questions you have with your health care provider.   Document Released: 10/28/2008 Document Revised: 12/16/2014 Document Reviewed: 03/17/2014 Elsevier Interactive Patient Education 2016 [employer]  Ethinyl Estradiol; Etonogestrel vaginal ring What is this medicine? ETHINYL ESTRADIOL; ETONOGESTREL (ETH in il es tra DYE ole; et oh noe JES trel)  vaginal ring is a flexible, vaginal ring used as a contraceptive (birth control method). This medicine combines two types of female hormones, an estrogen and a progestin. This ring is used to prevent ovulation and pregnancy. Each ring is effective for one month. This medicine may be used for other purposes; ask your health care provider or pharmacist if you have questions. What should I tell my health care provider before I take this medicine? They need to know if you have or ever had any of these conditions: -abnormal vaginal bleeding -blood vessel disease or blood clots -breast, cervical, endometrial, ovarian, liver, or uterine cancer -diabetes -gallbladder disease -heart disease or recent heart attack -high blood pressure -high cholesterol -kidney disease -liver disease -migraine headaches -stroke -systemic lupus erythematosus (SLE) -tobacco smoker -an unusual or allergic reaction to estrogens, progestins, other medicines, foods, dyes, or preservatives -pregnant or trying to get pregnant -breast-feeding How should I use this medicine? Insert the ring into your vagina as directed. Follow the directions on the prescription label. The ring will remain place for 3 weeks and is then removed for a 1-week break. A new ring is inserted 1 week after the last ring was removed, on the same day of the week. Do not use more often than directed. A patient package insert for the product will be given with each prescription and refill. Read this sheet carefully each  time. The sheet may change frequently. Contact your pediatrician regarding the use of this medicine in children. Special care may be needed. This medicine has been used in female children who have started having menstrual periods. Overdosage: If you think you have taken too much of this medicine contact a poison control center or emergency room at once. NOTE: This medicine is only for you. Do not share this medicine with others. What if I  miss a dose? You will need to replace your vaginal ring once a month as directed. If the ring should slip out, or if you leave it in longer or shorter than you should, contact your health care professional for advice. What may interact with this medicine? -acetaminophen -antibiotics or medicines for infections, especially rifampin, rifabutin, rifapentine, and griseofulvin, and possibly penicillins or tetracyclines -aprepitant -ascorbic acid (vitamin C) -atorvastatin -barbiturate medicines, such as phenobarbital -bosentan -carbamazepine -caffeine -clofibrate -cyclosporine -dantrolene -doxercalciferol -felbamate -grapefruit juice -hydrocortisone -medicines for anxiety or sleeping problems, such as diazepam or temazepam -medicines for diabetes, including pioglitazone -modafinil -mycophenolate -nefazodone -oxcarbazepine -phenytoin -prednisolone -ritonavir or other medicines for HIV infection or AIDS -rosuvastatin -selegiline -soy isoflavones supplements -St. John's wort -tamoxifen or raloxifene -theophylline -thyroid hormones -topiramate -warfarin This list may not describe all possible interactions. Give your health care provider a list of all the medicines, herbs, non-prescription drugs, or dietary supplements you use. Also tell them if you smoke, drink alcohol, or use illegal drugs. Some items may interact with your medicine. What should I watch for while using this medicine? Visit your doctor or health care professional for regular checks on your progress. You will need a regular breast and pelvic exam and Pap smear while on this medicine. Use an additional method of contraception during the first cycle that you use this ring. If you have any reason to think you are pregnant, stop using this medicine right away and contact your doctor or health care professional. If you are using this medicine for hormone related problems, it may take several cycles of use to see improvement  in your condition. Smoking increases the risk of getting a blood clot or having a stroke while you are using hormonal birth control, especially if you are more than 30 years old. You are strongly advised not to smoke. This medicine can make your body retain fluid, making your fingers, hands, or ankles swell. Your blood pressure can go up. Contact your doctor or health care professional if you feel you are retaining fluid. This medicine can make you more sensitive to the sun. Keep out of the sun. If you cannot avoid being in the sun, wear protective clothing and use sunscreen. Do not use sun lamps or tanning beds/booths. If you wear contact lenses and notice visual changes, or if the lenses begin to feel uncomfortable, consult your eye care specialist. In some women, tenderness, swelling, or minor bleeding of the gums may occur. Notify your dentist if this happens. Brushing and flossing your teeth regularly may help limit this. See your dentist regularly and inform your dentist of the medicines you are taking. If you are going to have elective surgery, you may need to stop using this medicine before the surgery. Consult your health care professional for advice. This medicine does not protect you against HIV infection (AIDS) or any other sexually transmitted diseases. What side effects may I notice from receiving this medicine? Side effects that you should report to your doctor or health care professional as soon as possible: -breast tissue  changes or discharge -changes in vaginal bleeding during your period or between your periods -chest pain -coughing up blood -dizziness or fainting spells -headaches or migraines -leg, arm or groin pain -severe or sudden headaches -stomach pain (severe) -sudden shortness of breath -sudden loss of coordination, especially on one side of the body -speech problems -symptoms of vaginal infection like itching, irritation or unusual discharge -tenderness in the  upper abdomen -vomiting -weakness or numbness in the arms or legs, especially on one side of the body -yellowing of the eyes or skin Side effects that usually do not require medical attention (report to your doctor or health care professional if they continue or are bothersome): -breakthrough bleeding and spotting that continues beyond the 3 initial cycles of pills -breast tenderness -mood changes, anxiety, depression, frustration, anger, or emotional outbursts -increased sensitivity to sun or ultraviolet light -nausea -skin rash, acne, or brown spots on the skin -weight gain (slight) This list may not describe all possible side effects. Call your doctor for medical advice about side effects. You may report side effects to FDA at 1-800-FDA-1088. Where should I keep my medicine? Keep out of the reach of children. Store at room temperature between 15 and 30 degrees C (59 and 86 degrees F) for up to 4 months. The product will expire after 4 months. Protect from light. Throw away any unused medicine after the expiration date. NOTE: This sheet is a summary. It may not cover all possible information. If you have questions about this medicine, talk to your doctor, pharmacist, or health care provider.    2016, Elsevier/Gold Standard. (2008-07-17 12:03:58) Ethinyl Estradiol; Norelgestromin skin patches What is this medicine? ETHINYL ESTRADIOL;NORELGESTROMIN (ETH in il es tra DYE ole; nor el JES troe min) skin patch is used as a contraceptive (birth control method). This medicine combines two types of female hormones, an estrogen and a progestin. This patch is used to prevent ovulation and pregnancy. This medicine may be used for other purposes; ask your health care provider or pharmacist if you have questions. What should I tell my health care provider before I take this medicine? They need to know if you have or ever had any of these conditions: -abnormal vaginal bleeding -blood vessel disease or  blood clots -breast, cervical, endometrial, ovarian, liver, or uterine cancer -diabetes -gallbladder disease -heart disease or recent heart attack -high blood pressure -high cholesterol -kidney disease -liver disease -migraine headaches -stroke -systemic lupus erythematosus (SLE) -tobacco smoker -an unusual or allergic reaction to estrogens, progestins, other medicines, foods, dyes, or preservatives -pregnant or trying to get pregnant -breast-feeding How should I use this medicine? This patch is applied to the skin. Follow the directions on the prescription label. Apply to clean, dry, healthy skin on the buttock, abdomen, upper outer arm or upper torso, in a place where it will not be rubbed by tight clothing. Do not use lotions or other cosmetics on the site where the patch will go. Press the patch firmly in place for 10 seconds to ensure good contact with the skin. Change the patch every 7 days on the same day of the week for 3 weeks. You will then have a break from the patch for 1 week, after which you will apply a new patch. Do not use your medicine more often than directed. Contact your pediatrician regarding the use of this medicine in children. Special care may be needed. This medicine has been used in female children who have started having menstrual periods. A patient package  insert for the product will be given with each prescription and refill. Read this sheet carefully each time. The sheet may change frequently. Overdosage: If you think you have taken too much of this medicine contact a poison control center or emergency room at once. NOTE: This medicine is only for you. Do not share this medicine with others. What if I miss a dose? You will need to replace your patch once a week as directed. If your patch is lost or falls off, contact your health care professional for advice. You may need to use another form of birth control if your patch has been off for more than 1 day. What  may interact with this medicine? -acetaminophen -antibiotics or medicines for infections, especially rifampin, rifabutin, rifapentine, and griseofulvin, and possibly penicillins or tetracyclines -aprepitant -ascorbic acid (vitamin C) -atorvastatin -barbiturate medicines, such as phenobarbital -bosentan -carbamazepine -caffeine -clofibrate -cyclosporine -dantrolene -doxercalciferol -felbamate -grapefruit juice -hydrocortisone -medicines for anxiety or sleeping problems, such as diazepam or temazepam -medicines for diabetes, including pioglitazone -modafinil -mycophenolate -nefazodone -oxcarbazepine -phenytoin -prednisolone -ritonavir or other medicines for HIV infection or AIDS -rosuvastatin -selegiline -soy isoflavones supplements -St. John's wort -tamoxifen or raloxifene -theophylline -thyroid hormones -topiramate -warfarin This list may not describe all possible interactions. Give your health care provider a list of all the medicines, herbs, non-prescription drugs, or dietary supplements you use. Also tell them if you smoke, drink alcohol, or use illegal drugs. Some items may interact with your medicine. What should I watch for while using this medicine? Visit your doctor or health care professional for regular checks on your progress. You will need a regular breast and pelvic exam and Pap smear while on this medicine. Use an additional method of contraception during the first cycle that you use this patch. If you have any reason to think you are pregnant, stop using this medicine right away and contact your doctor or health care professional. If you are using this medicine for hormone related problems, it may take several cycles of use to see improvement in your condition. Smoking increases the risk of getting a blood clot or having a stroke while you are using hormonal birth control, especially if you are more than 30 years old. You are strongly advised not to  smoke. This medicine can make your body retain fluid, making your fingers, hands, or ankles swell. Your blood pressure can go up. Contact your doctor or health care professional if you feel you are retaining fluid. This medicine can make you more sensitive to the sun. Keep out of the sun. If you cannot avoid being in the sun, wear protective clothing and use sunscreen. Do not use sun lamps or tanning beds/booths. If you wear contact lenses and notice visual changes, or if the lenses begin to feel uncomfortable, consult your eye care specialist. In some women, tenderness, swelling, or minor bleeding of the gums may occur. Notify your dentist if this happens. Brushing and flossing your teeth regularly may help limit this. See your dentist regularly and inform your dentist of the medicines you are taking. If you are going to have elective surgery or a MRI, you may need to stop using this medicine before the surgery or MRI. Consult your health care professional for advice. This medicine does not protect you against HIV infection (AIDS) or any other sexually transmitted diseases. What side effects may I notice from receiving this medicine? Side effects that you should report to your doctor or health care professional as soon as possible: -  breast tissue changes or discharge -changes in vaginal bleeding during your period or between your periods -chest pain -coughing up blood -dizziness or fainting spells -headaches or migraines -leg, arm or groin pain -severe or sudden headaches -stomach pain (severe) -sudden shortness of breath -sudden loss of coordination, especially on one side of the body -speech problems -symptoms of vaginal infection like itching, irritation or unusual discharge -tenderness in the upper abdomen -vomiting -weakness or numbness in the arms or legs, especially on one side of the body -yellowing of the eyes or skin Side effects that usually do not require medical attention  (report to your doctor or health care professional if they continue or are bothersome): -breakthrough bleeding and spotting that continues beyond the 3 initial cycles of pills -breast tenderness -mood changes, anxiety, depression, frustration, anger, or emotional outbursts -increased sensitivity to sun or ultraviolet light -nausea -skin rash, acne, or brown spots on the skin -weight gain (slight) This list may not describe all possible side effects. Call your doctor for medical advice about side effects. You may report side effects to FDA at 1-800-FDA-1088. Where should I keep my medicine? Keep out of the reach of children. Store at room temperature between 15 and 30 degrees C (59 and 86 degrees F). Keep the patch in its pouch until time of use. Throw away any unused medicine after the expiration date. Dispose of used patches properly. Since a used patch may still contain active hormones, fold the patch in half so that it sticks to itself prior to disposal. Throw away in a place where children or pets cannot reach. NOTE: This sheet is a summary. It may not cover all possible information. If you have questions about this medicine, talk to your doctor, pharmacist, or health care provider.    2016, Elsevier/Gold Standard. (2008-07-17 70:48:88)

## 2015-06-01 NOTE — Progress Notes (Signed)
   Subjective:    Patient ID: Crystal Jenkins, female    DOB: 02/02/1986, 29 y.o.   MRN: [id number]  HPI  29 yo presenting today as an ED follow up for the evaluation of vulva/bartholin cyst abscess. Patient had a recent I&D of a labial abscess on 04/22/2015 in the ED. The lesion has completely healed and she is currently without any complaints. She reports this as being the second occurrence over the past 9 months. She comes in today requesting surgical intervention in order to prevent it from happening again. Patient also reports excessive vaginal bleeding during the 3 rd month following depo-provera administration. This has happened with the last 2 injections. She desires to change her birth control option. She reports vaginal bleeding for 3 weeks with passage of clots.  Past Medical History  Diagnosis Date  . Cancer ([place])     cervical  . Extrauterine pregnancy   . Vertigo   . Adenocarcinoma of Bartholin's gland, stage 1 ([place])   . Vaginal Pap smear, abnormal     2005; had cryo  . Headache(784.0)   . Vertigo   . Infection     UTI  . MRSA (methicillin resistant Staphylococcus aureus)    Past Surgical History  Procedure Laterality Date  . Cesarean section    . Dilation and curettage of uterus    . Cryotherapy    . Laparotomy      removal of ectopic preg  . Cesarean section N/A 07/30/2014    Procedure: CESAREAN SECTION;  Surgeon: Truett Mainland, DO;  Location: [facility];  Service: Obstetrics;  Laterality: N/A;   Family History  Problem Relation Age of Onset  . Hypertension Mother   . Stroke Mother   . Heart disease Mother   . Epilepsy Mother   . Heart disease Paternal Aunt   . Hypertension Maternal Grandmother   . Diabetes Maternal Grandmother   . Cancer Maternal Grandmother    Social History  Substance Use Topics  . Smoking status: Former Smoker    Types: Cigarettes    Quit date: 11/11/2013  . Smokeless tobacco: Never Used     Comment: social somoker, quit with +preg    . Alcohol Use: No     Comment: occasionally     Review of Systems See pertinent in HPI    Objective:   Physical Exam  Not indicated      Assessment & Plan:  29 yo with h/o vulva abscess and DUB on depo - Discussed preventative measures with good perineal hygiene, avoiding shaving with dull razor blades - Advised to present to [facility] with next Bartholin Abscess to have it scheduled for surgical removal - Discussed birth control options- patient has taken OCP in the past but is interested in NuvaRing and Ortho Evra. She has no contraindications. She does not want another IUD - Patient will let [place] know which birth control she is interested in. Information on both provided - A total of 20 minutes was spent counseling this patient

## 2015-06-19 IMAGING — US US OB COMP LESS 14 WK
1 series · 14 of 28 positions shown · non-contrast
Comparison: None.

CLINICAL DATA: Abdominal pain. Positive pregnancy test. Unsure of
LMP

EXAM:
OBSTETRIC <14 WK US AND TRANSVAGINAL OB US
TECHNIQUE: Both transabdominal and transvaginal ultrasound examinations were
performed for complete evaluation of the gestation as well as the
maternal uterus, adnexal regions, and pelvic cul-de-sac.
Transvaginal technique was performed to assess early pregnancy.

[Series 1: us ob comp less 14 wk · 0.17mm/px · 40 acquisitions, 14 frames shown]
[im 2/40]
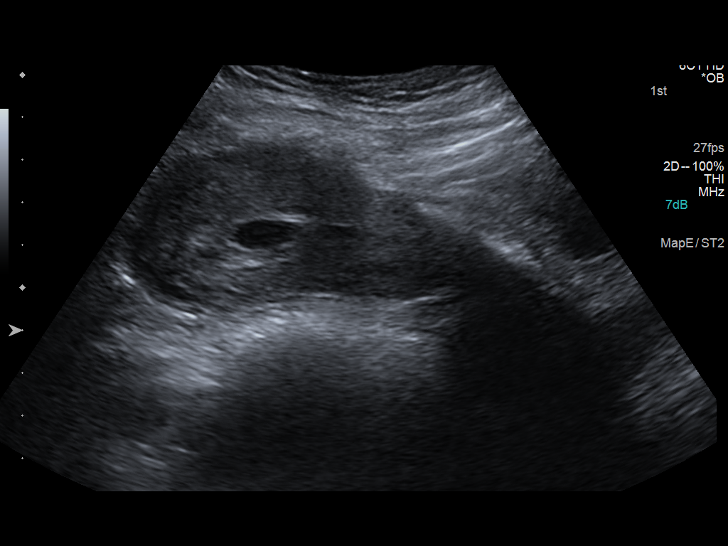
[im 5/40]
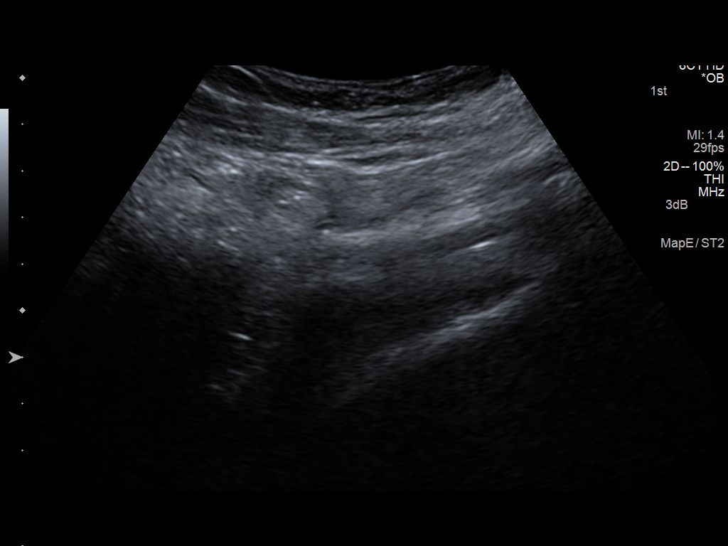
[im 8/40]
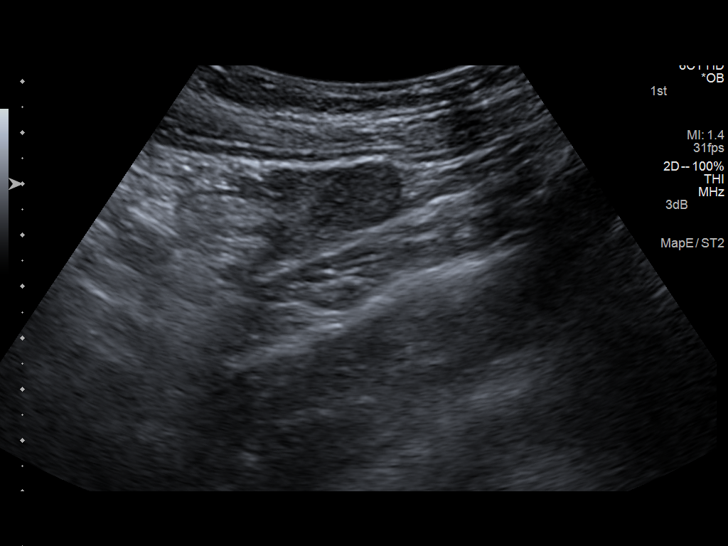
[im 11/40]
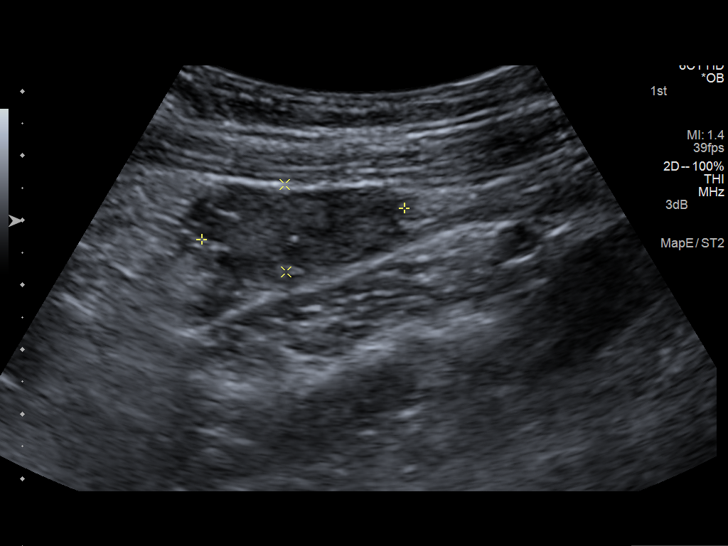
[im 14/40]
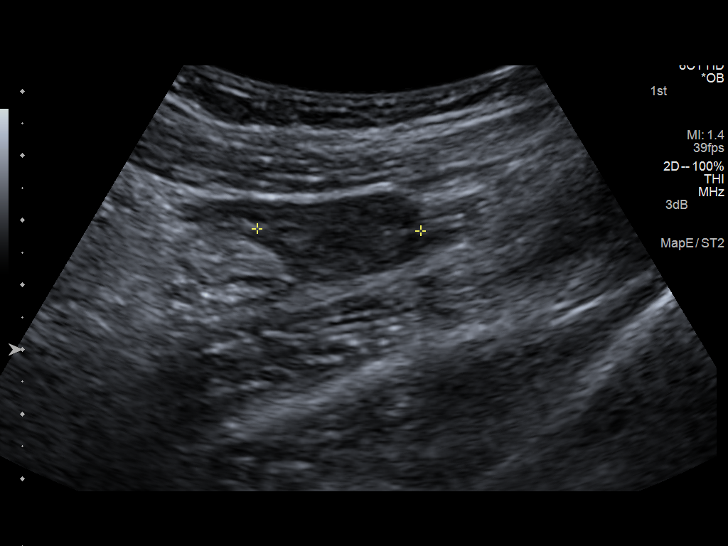
[im 16/40]
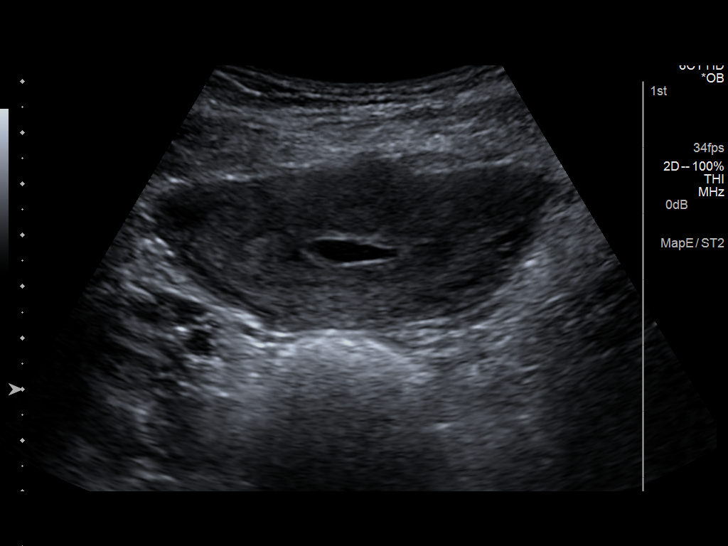
[im 19/40]
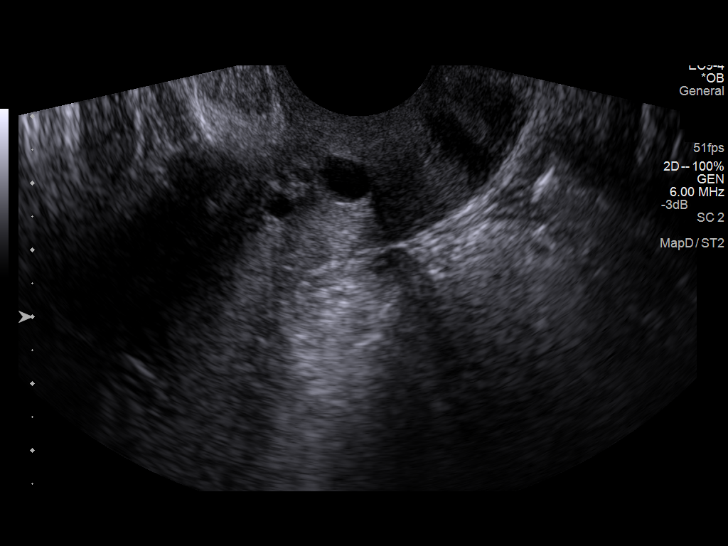
[im 22/40]
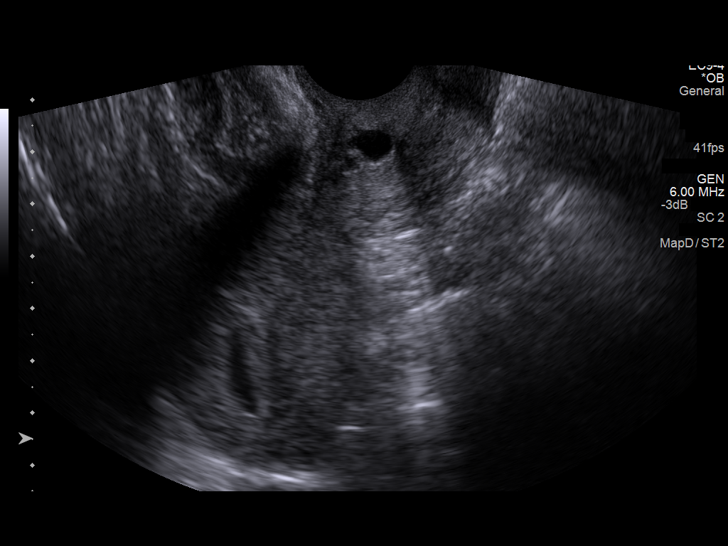
[im 25/40]
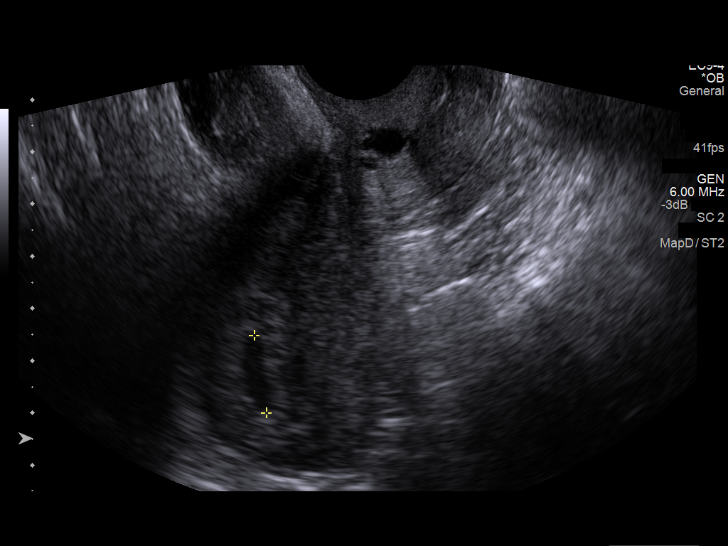
[im 28/40]
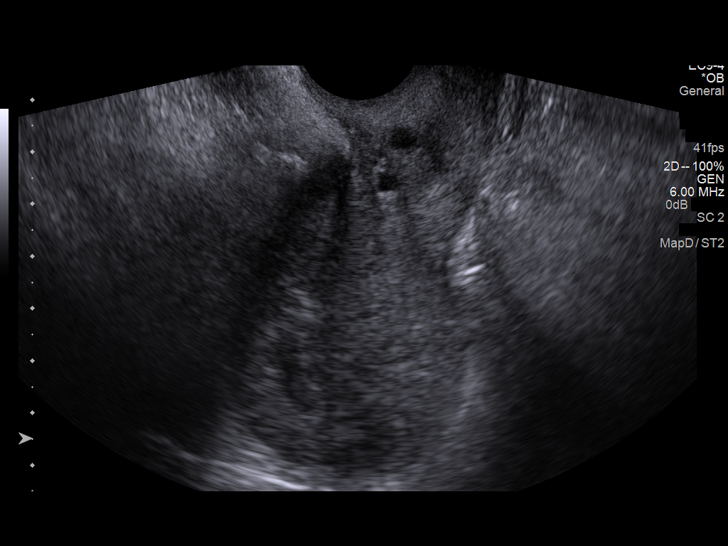
[im 31/40]
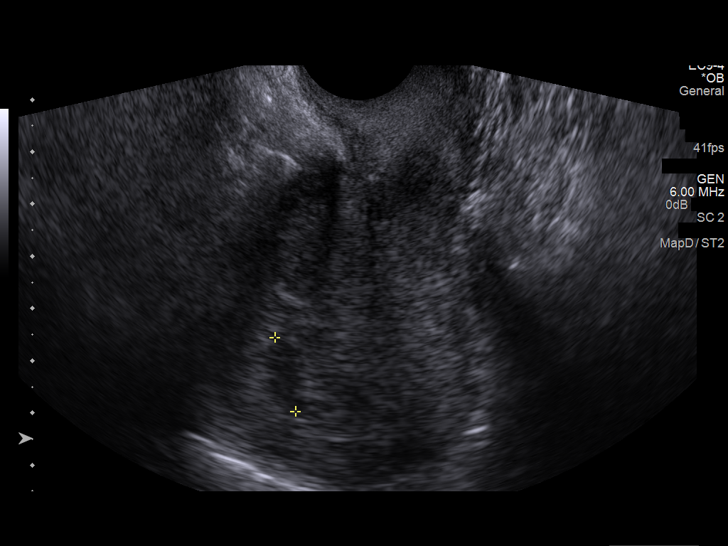
[im 34/40]
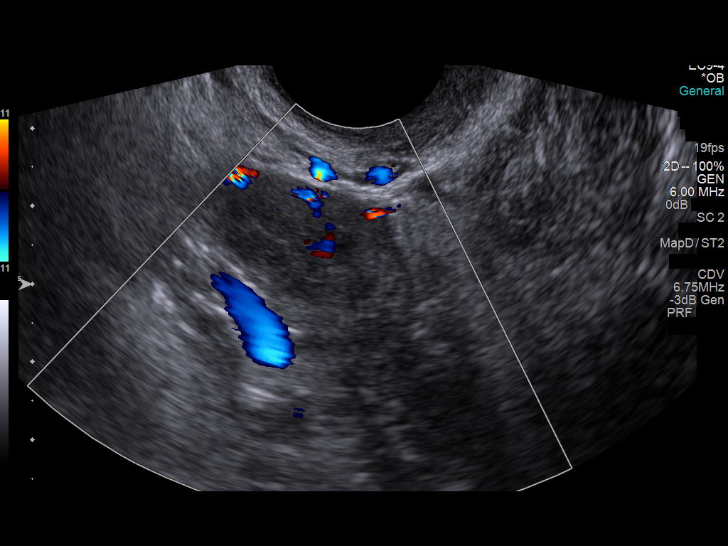
[im 37/40]
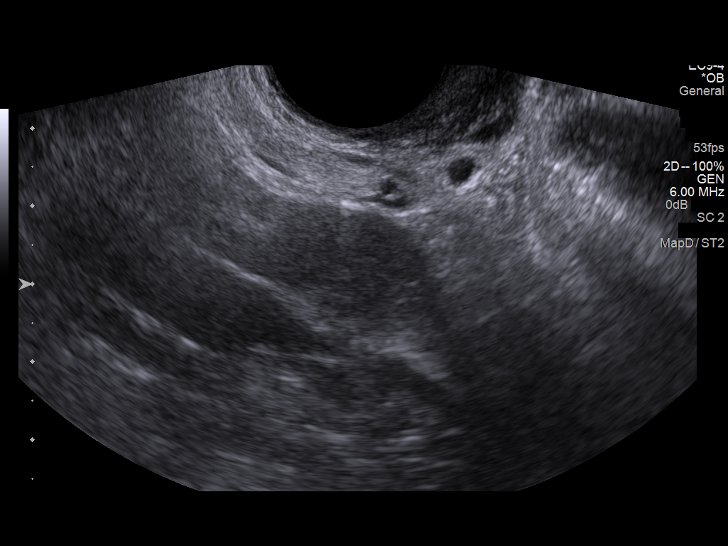
[im 40/40]
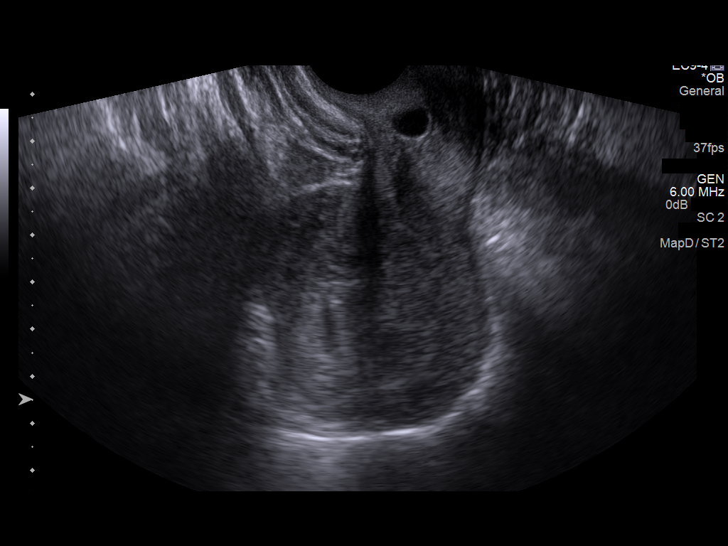

[14 of 28 positions shown; findings below may reference images not displayed]

FINDINGS: Intrauterine gestational sac: Probable single sac which is somewhat
elongated and flattened in shape

Yolk sac:  Not visualized

Embryo:  Not visualized

MSD:  15  mm   6 w   2  d

Maternal uterus/adnexae: Normal appearance of both ovaries. No mass
or free fluid identified.
IMPRESSION: Probable early intrauterine gestational sac measuring approximately
6 weeks. Sac shape is somewhat elongated and flattened, which is a
poor prognostic sign. Recommend continued followup of quantitative
HCG levels, with followup ultrasound in 10-14 days.

No adnexal mass or free fluid identified.

## 2015-06-29 IMAGING — US US OB TRANSVAGINAL
1 series · 14 of 26 positions shown · non-contrast
Comparison: [DATE]

CLINICAL DATA: Pregnancy with inconclusive fetal viability. Unsure
of LMP. Vaginal bleeding.

EXAM:
TRANSVAGINAL OB ULTRASOUND
TECHNIQUE: Transvaginal ultrasound was performed for complete evaluation of the
gestation as well as the maternal uterus, adnexal regions, and
pelvic cul-de-sac.

[Series 1: us ob transvaginal · 14 of 26 slices shown]
[im 1/26]
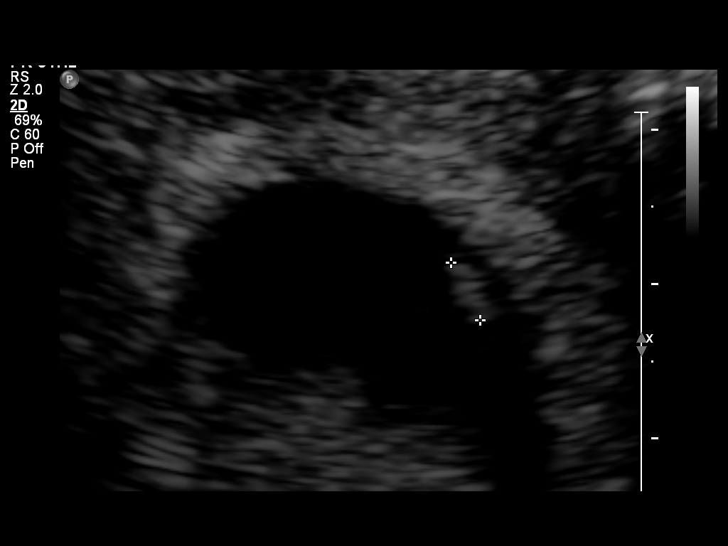
[im 3/26]
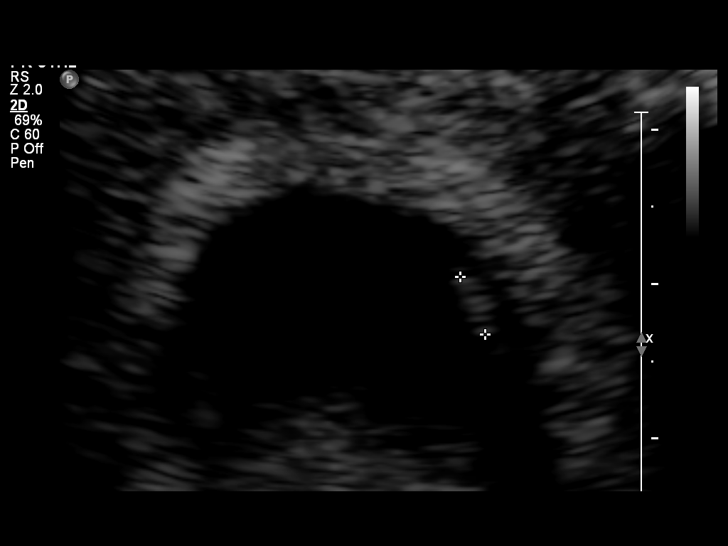
[im 5/26]
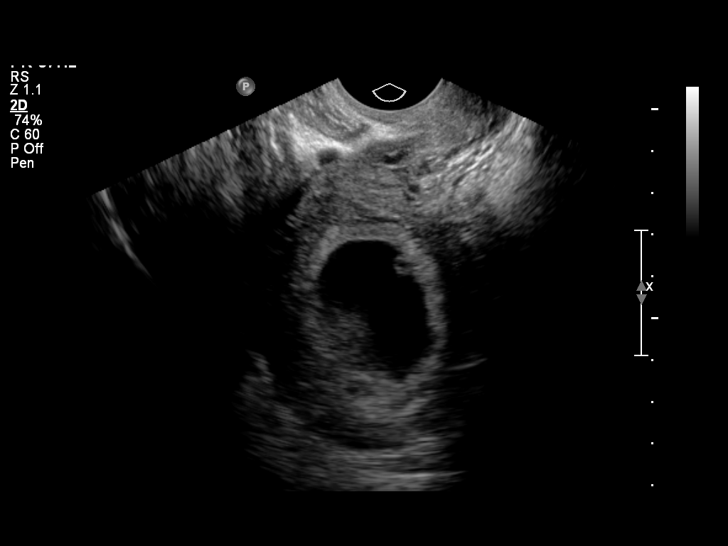
[im 7/26]
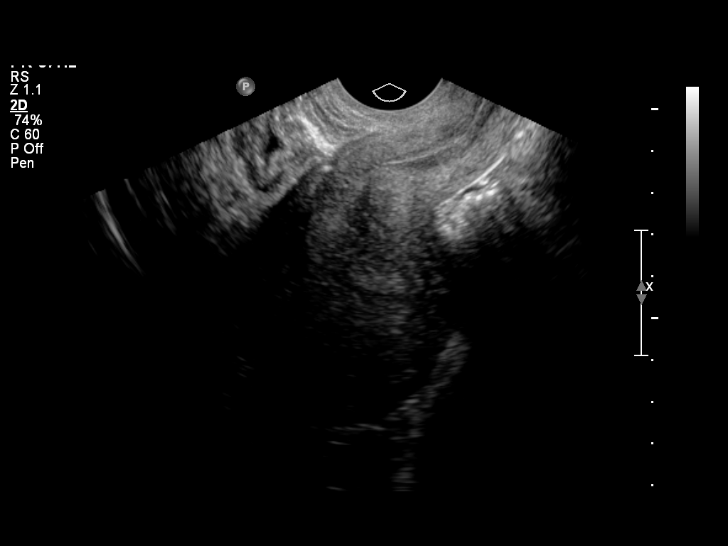
[im 9/26]
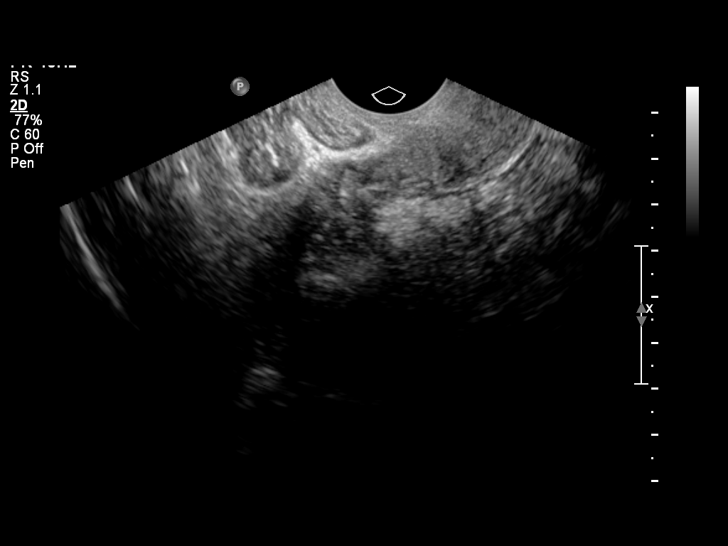
[im 11/26]
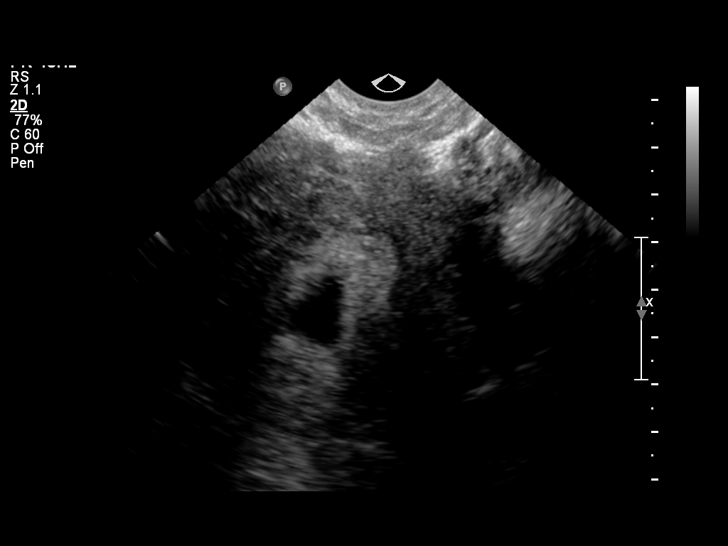
[im 13/26]
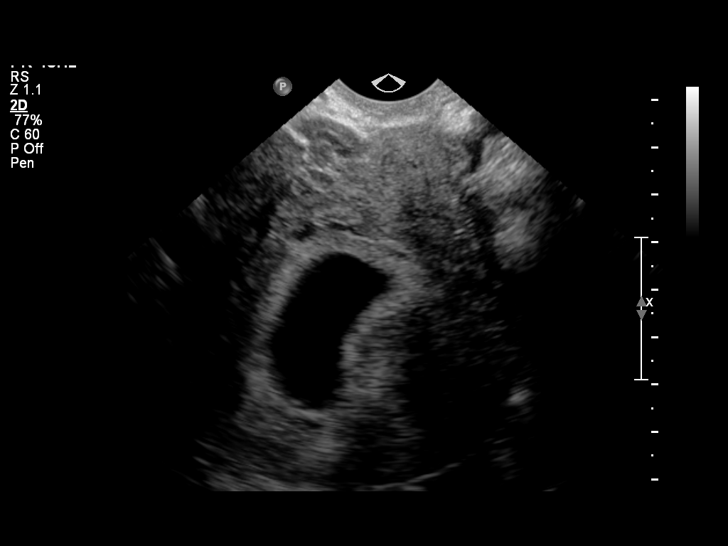
[im 14/26]
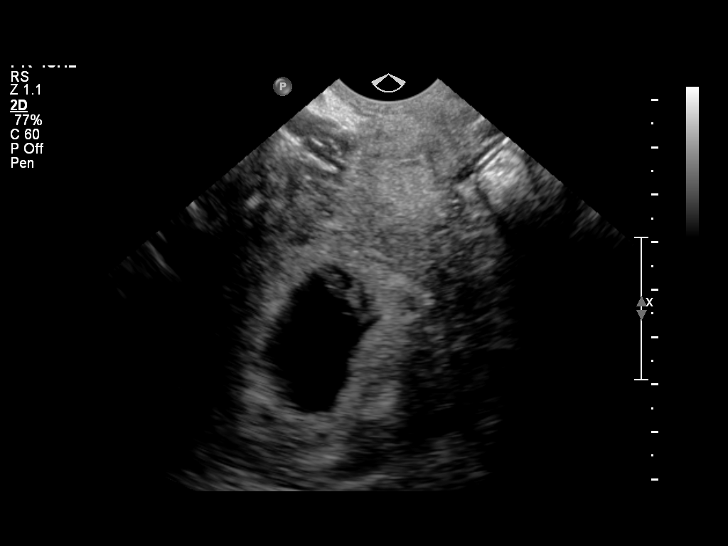
[im 16/26]
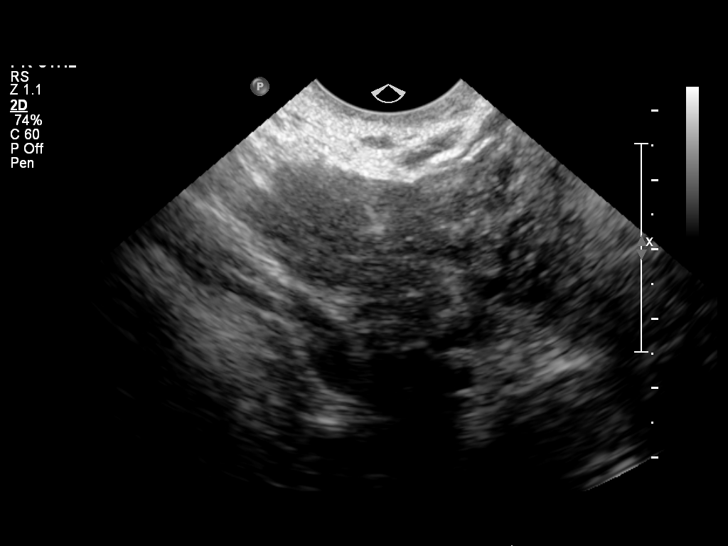
[im 18/26]
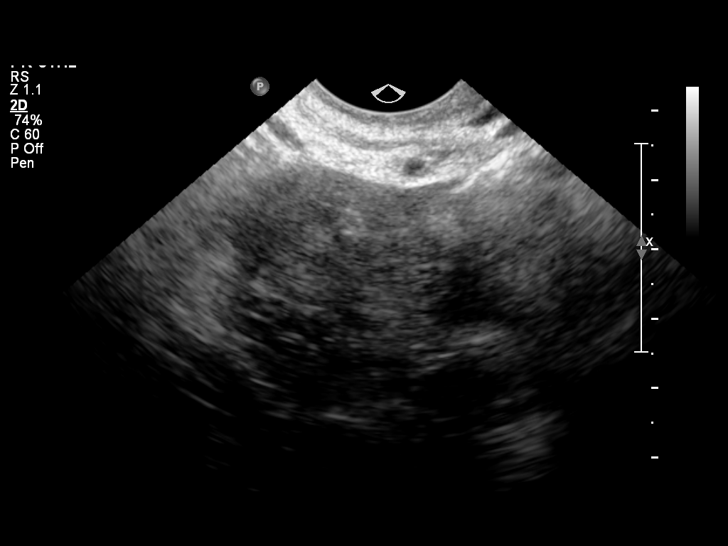
[im 20/26]
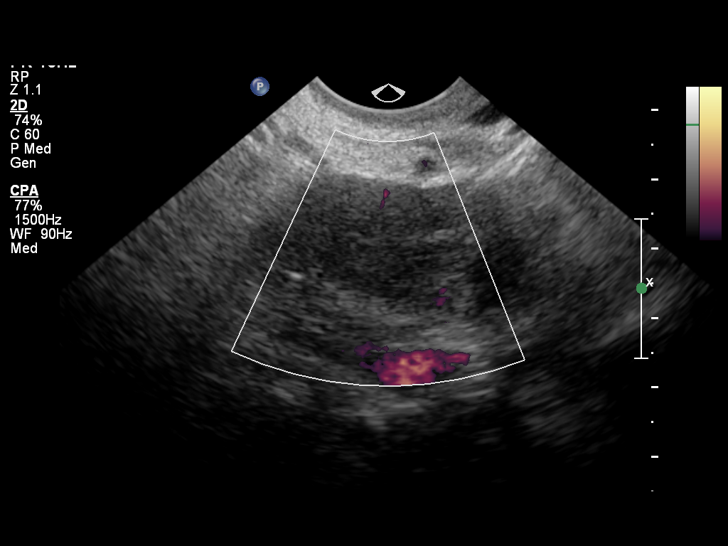
[im 22/26]
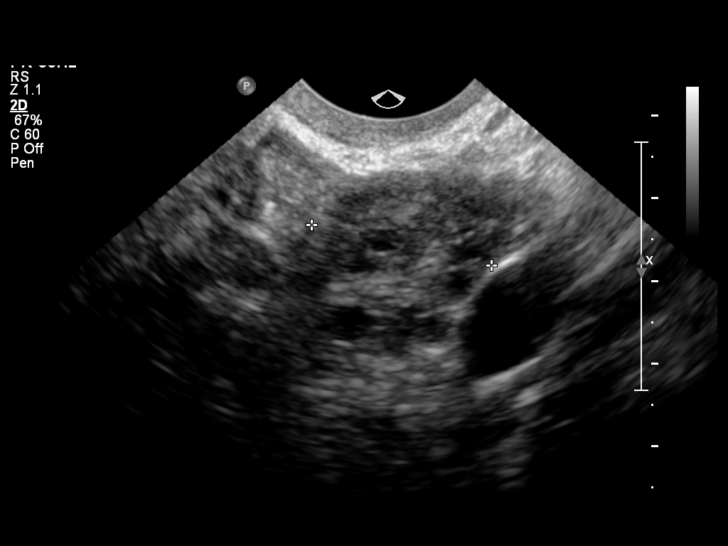
[im 24/26]
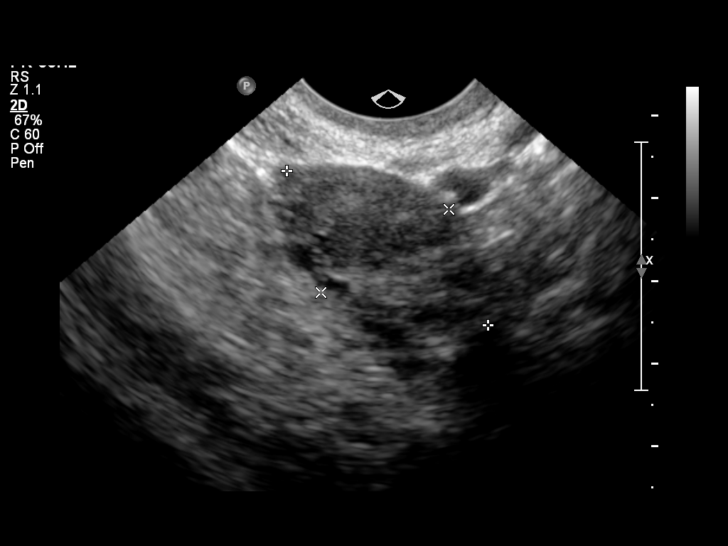
[im 26/26]
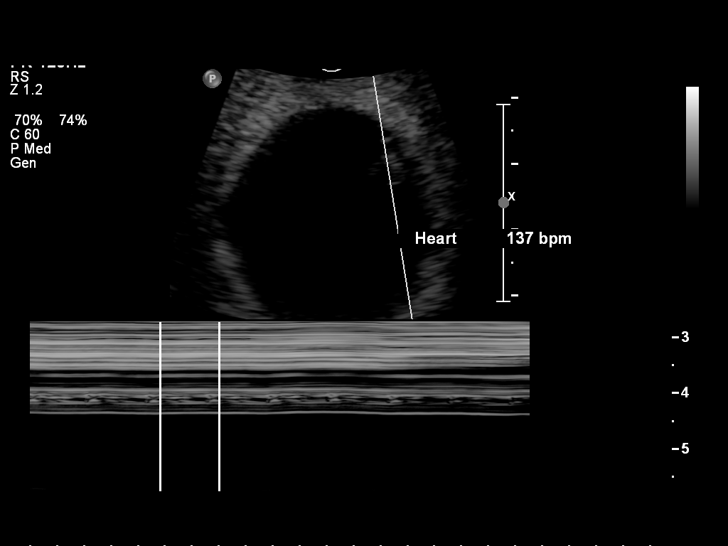

[14 of 26 positions shown; findings below may reference images not displayed]

FINDINGS: Intrauterine gestational sac: Visualized/normal in shape.

Yolk sac:  Visualized

Embryo:  Visualized

Cardiac Activity: Visualized

Heart Rate: 137 bpm

CRL:   4  mm   6 w 1 d                  US EDC: 08/05/2014

Maternal uterus/adnexae: No adnexal mass or free fluid identified.
Both ovaries are normal in appearance.
IMPRESSION: Single living IUP measuring 6 weeks 1 day with US EDC of 08/05/2014.

No significant maternal uterine or adnexal abnormality identified.

## 2015-08-03 ENCOUNTER — Ambulatory Visit (INDEPENDENT_AMBULATORY_CARE_PROVIDER_SITE_OTHER): Payer: Medicaid Other | Admitting: *Deleted

## 2015-08-03 DIAGNOSIS — Z3042 Encounter for surveillance of injectable contraceptive: Secondary | ICD-10-CM | POA: Diagnosis not present

## 2015-08-03 MED ORDER — MEDROXYPROGESTERONE ACETATE 150 MG/ML IM SUSP
150.0000 mg | Freq: Once | INTRAMUSCULAR | Status: AC
  Administered 2015-08-03: 150 mg via INTRAMUSCULAR

## 2015-08-28 ENCOUNTER — Ambulatory Visit: Payer: Medicaid Other | Admitting: Obstetrics and Gynecology

## 2015-09-23 IMAGING — US US OB COMP +14 WK
1 series · 12 of 28 positions shown · non-contrast
Comparison: none

[Series 1: us ob comp +14 wk · 12 of 121 slices shown]
[im 5/121]
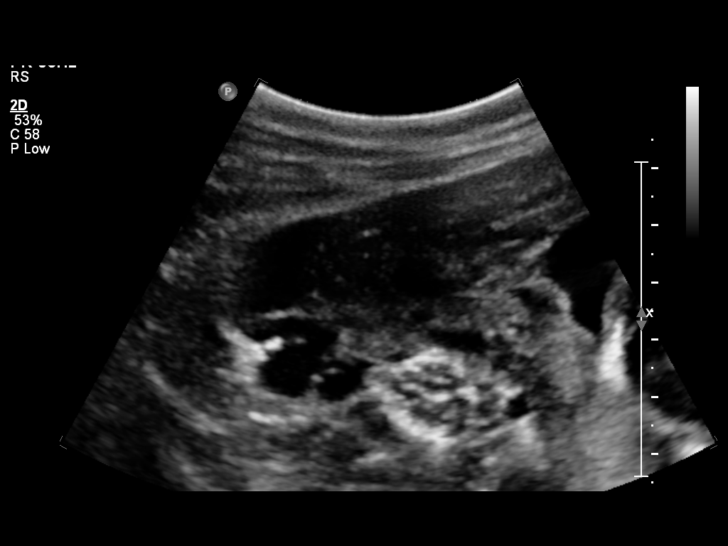
[im 14/121]
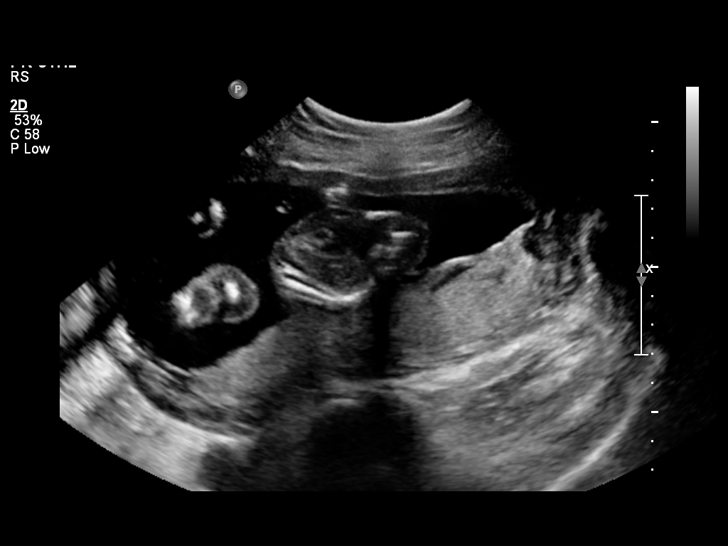
[im 23/121]
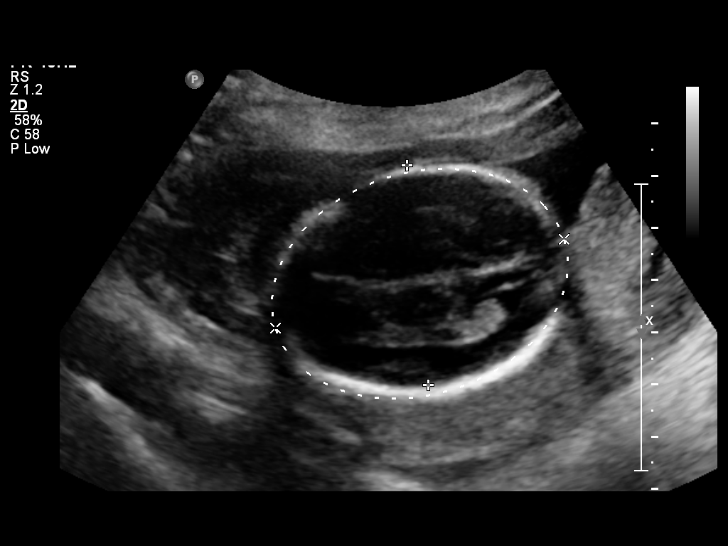
[im 36/121]
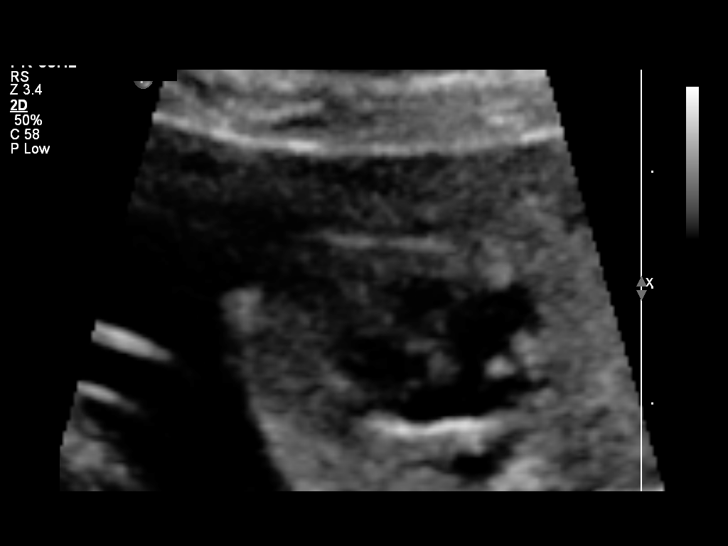
[im 45/121]
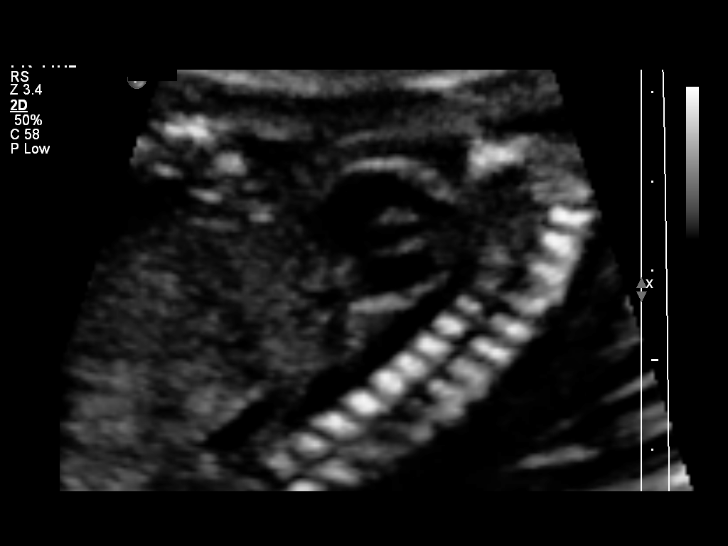
[im 54/121]
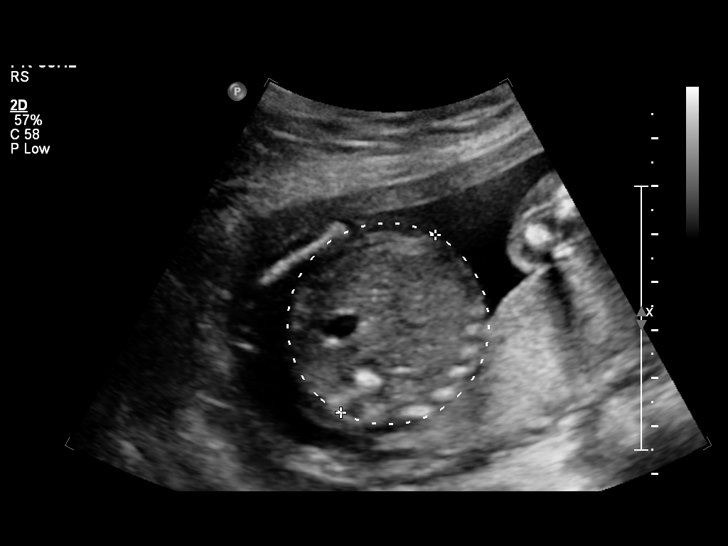
[im 67/121]
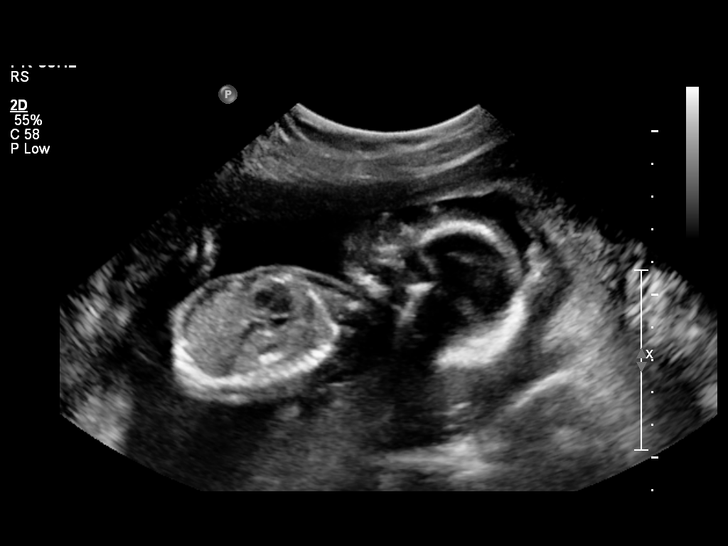
[im 76/121]
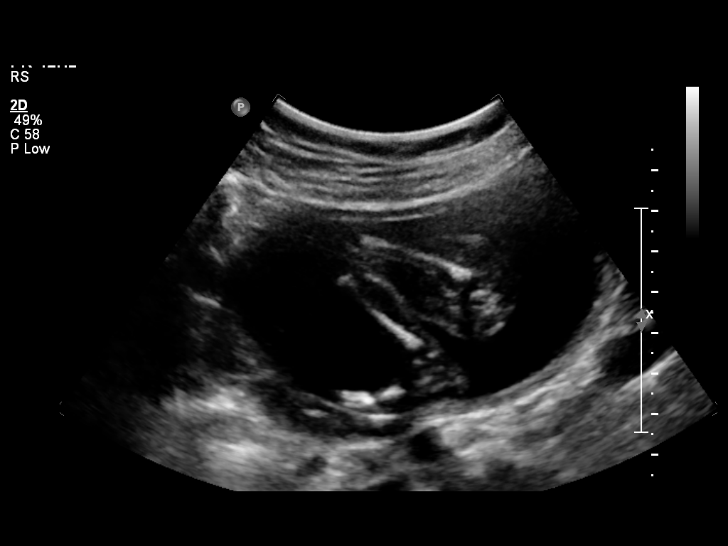
[im 85/121]
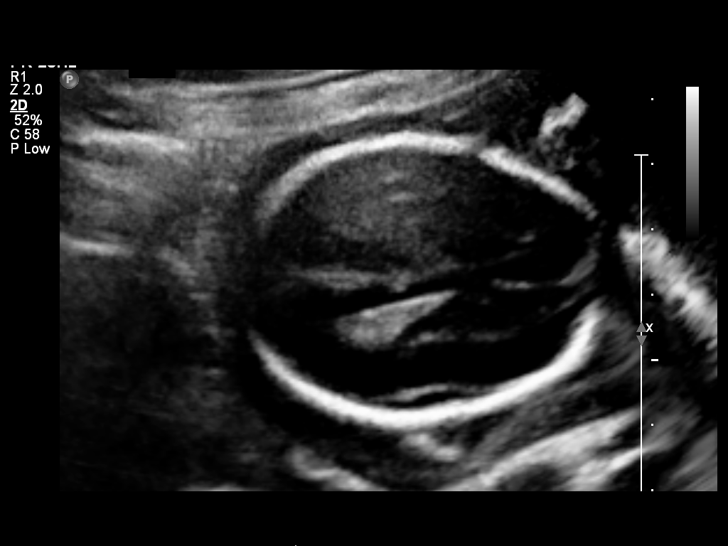
[im 98/121]
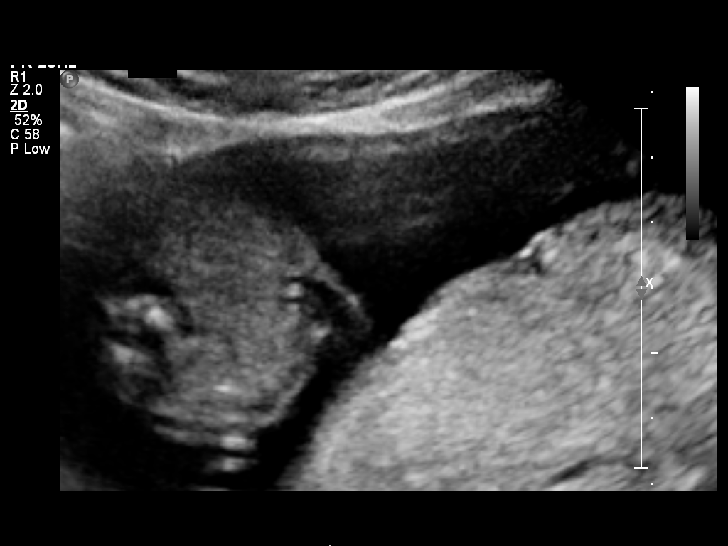
[im 107/121]
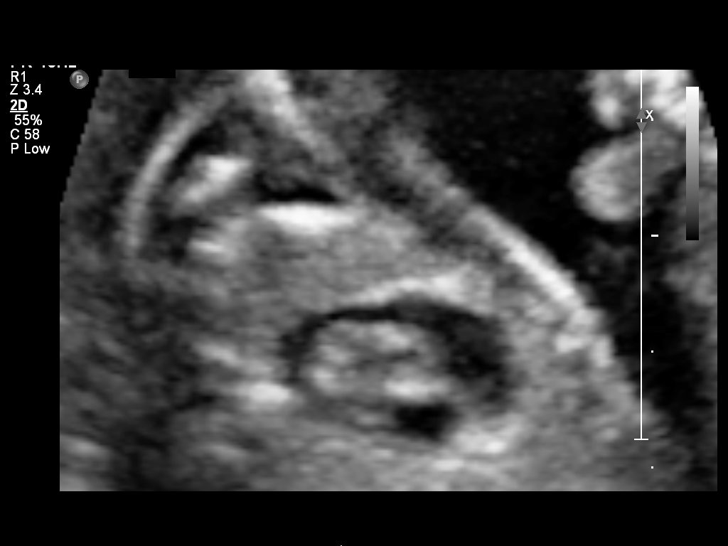
[im 116/121]
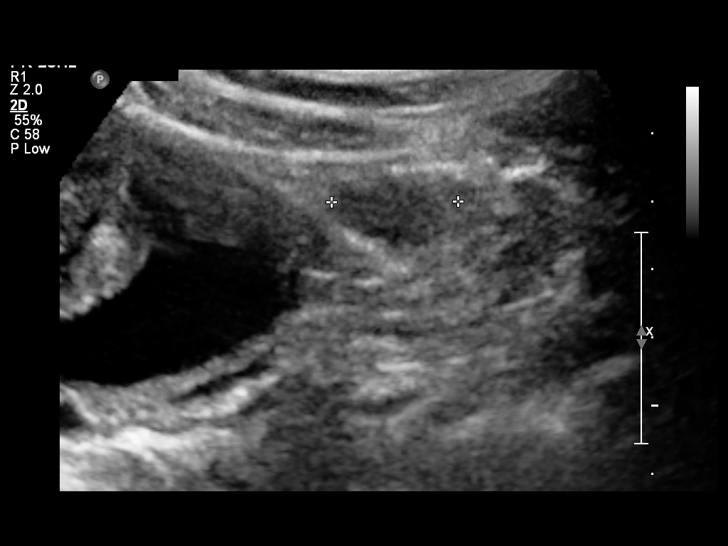

[12 of 28 positions shown; findings below may reference images not displayed]

OBSTETRICS REPORT
                      (Signed Final 03/07/2014 [DATE])

Service(s) Provided

 US OB COMP + 14 WK                                    76805.1
Indications

 Basic anatomic survey
 Previous cesarean section x 2
 High risk pregnancy due to maternal drug abuse        648.40, 305.90,

 Traumatic injury during pregnancy                     648.90,
 Adenocarcinoma bartholin gland
Fetal Evaluation

 Num Of Fetuses:    1
 Fetal Heart Rate:  150                          bpm
 Cardiac Activity:  Observed
 Presentation:      Cephalic
 Placenta:          Posterior, above cervical
                    os
 P. Cord            Visualized
 Insertion:

 Amniotic Fluid
 AFI FV:      Subjectively within normal limits
                                             Larg Pckt:     4.6  cm
Biometry

 BPD:     42.8  mm     G. Age:  19w 0d                CI:        70.95   70 - 86
                                                      FL/HC:      18.0   15.8 -
                                                                         18
 HC:     161.9  mm     G. Age:  19w 0d       76  %    HC/AC:      1.21   1.07 -

 AC:     133.9  mm     G. Age:  18w 6d       66  %    FL/BPD:
 FL:      29.1  mm     G. Age:  19w 0d       68  %    FL/AC:      21.7   20 - 24
 CER:     19.1  mm     G. Age:  18w 4d       59  %
 NFT:     3.38  mm

 Est. FW:     264  gm      0 lb 9 oz     57  %
Gestational Age

 LMP:           18w 2d        Date:  10/30/13                 EDD:   08/06/14
 U/S Today:     19w 0d                                        EDD:   08/01/14
 Best:          18w 2d     Det. By:  LMP  (10/30/13)          EDD:   08/06/14
Anatomy

 Cranium:          Appears normal         Aortic Arch:      Appears normal
 Fetal Cavum:      Appears normal         Ductal Arch:      Appears normal
 Ventricles:       Appears normal         Diaphragm:        Appears normal
 Choroid Plexus:   Appears normal         Stomach:          Appears normal, left
                                                            sided
 Cerebellum:       Appears normal         Abdomen:          Appears normal
 Posterior Fossa:  Appears normal         Abdominal Wall:   Appears nml (cord
                                                            insert, abd wall)
 Nuchal Fold:      Appears normal         Cord Vessels:     Appears normal (3
                                                            vessel cord)
 Face:             Appears normal         Kidneys:          Appear normal
                   (orbits and profile)
 Lips:             Appears normal         Bladder:          Appears normal
 Heart:            Appears normal         Spine:            Not well visualized
                   (4CH, axis, and
                   situs)
 RVOT:             Appears normal         Lower             Appears normal
                                          Extremities:
 LVOT:             Appears normal         Upper             Appears normal
                                          Extremities:

 Other:  Technically difficult due to fetal position. Nasal bone visualized.
         Female gender.
Targeted Anatomy

 Fetal Central Nervous System
 Lat. Ventricles:  5.4                    Cisterna Magna:
Cervix Uterus Adnexa

 Cervical Length:    4.2      cm

 Cervix:       Normal appearance by transabdominal scan.

 Left Ovary:    Within normal limits.
 Right Ovary:   Within normal limits.
Impression

 SIUP at 16w2d
 EFW 57th%
 No dysmorphic features
 No previa
Recommendations

 Follow up ultrasounds as clinically indicated.

 questions or concerns.

## 2015-10-19 ENCOUNTER — Ambulatory Visit: Payer: Medicaid Other

## 2015-11-02 ENCOUNTER — Ambulatory Visit: Payer: Medicaid Other

## 2016-11-06 ENCOUNTER — Emergency Department (HOSPITAL_COMMUNITY)
Admission: EM | Admit: 2016-11-06 | Discharge: 2016-11-06 | Disposition: A | Discharge status: Home / Self Care | Payer: Medicaid Other | Attending: Emergency Medicine | Admitting: Emergency Medicine

## 2016-11-06 ENCOUNTER — Encounter (HOSPITAL_COMMUNITY): Payer: Self-pay | Admitting: Emergency Medicine

## 2016-11-06 DIAGNOSIS — Z79899 Other long term (current) drug therapy: Secondary | ICD-10-CM | POA: Insufficient documentation

## 2016-11-06 DIAGNOSIS — Z8541 Personal history of malignant neoplasm of cervix uteri: Secondary | ICD-10-CM | POA: Insufficient documentation

## 2016-11-06 DIAGNOSIS — N751 Abscess of Bartholin's gland: Secondary | ICD-10-CM | POA: Insufficient documentation

## 2016-11-06 DIAGNOSIS — Z87891 Personal history of nicotine dependence: Secondary | ICD-10-CM | POA: Insufficient documentation

## 2016-11-06 MED ORDER — LIDOCAINE-EPINEPHRINE (PF) 2 %-1:200000 IJ SOLN
10.0000 mL | Freq: Once | INTRAMUSCULAR | Status: AC
  Administered 2016-11-06: 10 mL
  Filled 2016-11-06: qty 20

## 2016-11-06 MED ORDER — CLINDAMYCIN HCL 300 MG PO CAPS: 300.0000 mg | ORAL_CAPSULE | Freq: Three times a day (TID) | ORAL | 0 refills | Status: AC

## 2016-11-06 NOTE — ED Triage Notes (Signed)
Pt from home with c/o vaginal abscess for the last 8 days with hx of the same.  NAD, A&O.

## 2016-11-06 NOTE — Discharge Instructions (Addendum)
Call the referred physician at the [facility] to arrange an appointment to be seen in 3-4 days.   Return to the ED in 2 days for a wound recheck. If the packing falls out before then, it is ok.   Take the prescribed antibiotic as directed.   Return to the Emergency department for any fevers, severe or worsening pain, return of symptoms or increased redness or swelling.

## 2016-11-06 NOTE — ED Notes (Signed)
I&D tray & pelvic cart at bedside

## 2016-11-06 NOTE — ED Provider Notes (Signed)
[facility] DEPT Provider Note   CSN: [id number] Arrival date & time: 11/06/16  1123     History   Chief Complaint Chief Complaint  Patient presents with  . Abscess    HPI Crystal Jenkins is a 31 y.o. female who presents with 8 days of pain, redness, and swelling to her left labia. Patient reports that symptoms feel similar to her previous episodes of Bartholin's Cyst. Patient states that her pain is currently an 8/10 and is worsened with movement and sitting down. She has tried taking Ibuprofen for the pain with no relief. She has also attempted to use Sitz baths with no resolution of symptoms. She has had to have similar abscesses removed several times, most recently in 2016. Patient denies any vaginal discharge, vaginal bleeding, dysuria, hematuria. She denies any history of STD.   The history is provided by the patient.    Past Medical History:  Diagnosis Date  . Adenocarcinoma of Bartholin's gland, stage 1 ([place])   . Cancer ([place])    cervical  . Extrauterine pregnancy   . Headache(784.0)   . Infection    UTI  . MRSA (methicillin resistant Staphylococcus aureus)   . Vaginal Pap smear, abnormal    2005; had cryo  . Vertigo   . Vertigo     Patient Active Problem List   Diagnosis Date Noted  . S/P cesarean section 08/02/2014  . Pregnancy 08/02/2014  . Bartholin's cyst 04/07/2014  . Previous cesarean delivery x 2 affecting pregnancy, antepartum 12/16/2013  . Migraine with aura 09/20/2013  . Nausea and vomiting in pregnancy 03/09/2013  . Hyperemesis gravidarum 03/09/2013  . Dehydration, mild 03/09/2013  . Supervision of normal pregnancy 03/09/2013    Past Surgical History:  Procedure Laterality Date  . CESAREAN SECTION    . CESAREAN SECTION N/A 07/30/2014   Procedure: CESAREAN SECTION;  Surgeon: Truett Mainland, DO;  Location: [facility];  Service: Obstetrics;  Laterality: N/A;  . CRYOTHERAPY    . DILATION AND CURETTAGE OF UTERUS    . LAPAROTOMY     removal of  ectopic preg    OB History    Gravida Para Term Preterm AB Living   5 3 3  0 2 3   SAB TAB Ectopic Multiple Live Births   1   1 0 3       Home Medications    Prior to Admission medications   Medication Sig Start Date End Date Taking? Authorizing Provider  clindamycin (CLEOCIN) 300 MG capsule Take 1 capsule (300 mg total) by mouth 3 (three) times daily. 11/06/16 11/13/16  Orson Aloe, PA  cyclobenzaprine (FLEXERIL) 10 MG tablet Take 0.5-1 tablets (5-10 mg total) by mouth 3 (three) times daily as needed for muscle spasms. 09/12/14   Rosemarie Ax, MD  doxycycline (VIBRAMYCIN) 100 MG capsule Take 1 capsule (100 mg total) by mouth 2 (two) times daily. 04/22/15   Jola Schmidt, MD  ibuprofen (ADVIL,MOTRIN) 600 MG tablet Take 1 tablet (600 mg total) by mouth every 8 (eight) hours as needed. 04/22/15   Jola Schmidt, MD  medroxyPROGESTERone (DEPO-SUBQ PROVERA 104) 104 MG/0.65ML injection Inject 0.65 mLs (104 mg total) into the skin every 3 (three) months. 09/03/14   Lavonia Drafts, MD  polyethylene glycol powder (MIRALAX) powder Take 17 g by mouth daily. Patient taking differently: Take 17 g by mouth daily as needed.  06/17/14   Shelbie Hutching, MD  zolpidem (AMBIEN) 5 MG tablet Take 5 mg by mouth at bedtime as  needed. for sleep 07/28/14   Historical Provider, MD    Family History Family History  Problem Relation Age of Onset  . Hypertension Mother   . Stroke Mother   . Heart disease Mother   . Epilepsy Mother   . Heart disease Paternal Aunt   . Hypertension Maternal Grandmother   . Diabetes Maternal Grandmother   . Cancer Maternal Grandmother     Social History Social History  Substance Use Topics  . Smoking status: Former Smoker    Types: Cigarettes    Quit date: 11/11/2013  . Smokeless tobacco: Never Used     Comment: social somoker, quit with +preg  . Alcohol use No     Comment: occasionally     Allergies   Bactrim [sulfamethoxazole-trimethoprim] and  Oxycodone   Review of Systems Review of Systems  Constitutional: Positive for fever (subjective). Negative for chills.  HENT: Negative for congestion and rhinorrhea.   Respiratory: Negative for shortness of breath.   Cardiovascular: Negative for chest pain.  Gastrointestinal: Negative for abdominal pain, nausea and vomiting.  Genitourinary: Positive for vaginal pain. Negative for dysuria, hematuria, vaginal bleeding and vaginal discharge.  Skin: Positive for color change.  Neurological: Negative for headaches.  All other systems reviewed and are negative.    Physical Exam Updated Vital Signs BP (!) 178/167   Pulse 81   Temp 98.6 F (37 C) (Oral)   Resp 16   Ht 5\' 2"  (1.575 m)   Wt 56.7 kg   LMP 11/01/2016 (Exact Date)   SpO2 99%   BMI 22.86 kg/m   Physical Exam  Constitutional: She appears well-developed and well-nourished.  HENT:  Head: Normocephalic and atraumatic.  Mouth/Throat: Oropharynx is clear and moist and mucous membranes are normal.  Eyes: Conjunctivae and EOM are normal. Pupils are equal, round, and reactive to light. Right eye exhibits no discharge. Left eye exhibits no discharge. No scleral icterus.  Cardiovascular: Normal rate, regular rhythm and intact distal pulses.   Pulmonary/Chest: Effort normal.  Abdominal: Soft. Normal appearance and bowel sounds are normal. She exhibits no distension. There is no tenderness.  Genitourinary:     Genitourinary Comments: Large abscess to the left medial labia with fluctuance and surrounding erythema and tenderness to palpation.  Normal appearance of right labia.   Musculoskeletal: She exhibits no deformity.  Neurological: She is alert.  Skin: Skin is warm and dry.  Psychiatric: She has a normal mood and affect. Her speech is normal and behavior is normal.     ED Treatments / Results  Labs (all labs ordered are listed, but only abnormal results are displayed) Labs Reviewed - No data to display  EKG  EKG  Interpretation None       Radiology No results found.  Procedures Procedures (including critical care time) INCISION AND DRAINAGE Performed by: Orson Aloe Consent: Verbal consent obtained. Risks and benefits: risks, benefits and alternatives were discussed Type: abscess  Body area: left labia   Anesthesia: local infiltration  Incision was made with a scalpel.  Local anesthetic: lidocaine 2% with epinephrine  Anesthetic total: 14 ml  Complexity: complex Blunt dissection to break up loculations  Drainage: purulent  Drainage amount: moderate  Packing material: 1/4 in iodoform gauze  Patient tolerance: Patient tolerated the procedure well with no immediate complications.     Medications Ordered in ED Medications  lidocaine-EPINEPHrine (XYLOCAINE W/EPI) 2 %-1:200000 (PF) injection 10 mL (10 mLs Other Given by Other 11/06/16 1232)     Initial Impression /  Assessment and Plan / ED Course  I have reviewed the triage vital signs and the nursing notes.  Pertinent labs & imaging results that were available during my care of the patient were reviewed by me and considered in my medical decision making (see chart for details).    31 yo F presents with 8 days of pain, redness, swelling to the left labia. She has a history of recurring Bartholin's cyst and reports that this feels similar. Physical exam with fluctuant abscess on left labia consistent with an Bartholin's cyst. Plan to I&D abscess in the department and insert a word catheter. Review of records indicate a prior history of same symptoms  Discussed procedure with patient. She has had a word catheter inserted previously and requests that we not put one in now. Will I&D the abscess and back it with iodoform gauze.    She has previously been seen at the [facility]. Provided referral number and plan to have her follow-up with them on an outpatient basis. Will start patient on Clindamycin x 7 days. Return  precautions discussed. Patient expresses understanding and agreement to plan.   Final Clinical Impressions(s) / ED Diagnoses   Final diagnoses:  Bartholin's gland abscess    New Prescriptions Discharge Medication List as of 11/06/2016  1:41 PM    START taking these medications   Details  clindamycin (CLEOCIN) 300 MG capsule Take 1 capsule (300 mg total) by mouth 3 (three) times daily., Starting Sun 11/06/2016, Until Sun 11/13/2016, [place], [place] 11/06/16 [place], MD 11/08/16 2034

## 2016-12-16 ENCOUNTER — Ambulatory Visit (INDEPENDENT_AMBULATORY_CARE_PROVIDER_SITE_OTHER): Payer: Medicaid Other | Admitting: Obstetrics & Gynecology

## 2016-12-16 ENCOUNTER — Other Ambulatory Visit (HOSPITAL_COMMUNITY)
Admission: RE | Admit: 2016-12-16 | Discharge: 2016-12-16 | Disposition: A | Discharge status: Home / Self Care | Payer: Medicaid Other | Source: Ambulatory Visit | Attending: Obstetrics & Gynecology | Admitting: Obstetrics & Gynecology

## 2016-12-16 VITALS — BP 141/81 | HR 88 | Wt 130.0 lb

## 2016-12-16 DIAGNOSIS — Z3042 Encounter for surveillance of injectable contraceptive: Secondary | ICD-10-CM | POA: Diagnosis not present

## 2016-12-16 DIAGNOSIS — N751 Abscess of Bartholin's gland: Secondary | ICD-10-CM | POA: Insufficient documentation

## 2016-12-16 DIAGNOSIS — Z01419 Encounter for gynecological examination (general) (routine) without abnormal findings: Secondary | ICD-10-CM

## 2016-12-16 LAB — POCT PREGNANCY, URINE: Preg Test, Ur: NEGATIVE

## 2016-12-16 MED ORDER — MEDROXYPROGESTERONE ACETATE 150 MG/ML IM SUSP
150.0000 mg | Freq: Once | INTRAMUSCULAR | Status: AC
  Administered 2016-12-16: 150 mg via INTRAMUSCULAR

## 2016-12-16 NOTE — Patient Instructions (Signed)
Breast Self-Awareness Breast self-awareness means being familiar with how your breasts look and feel. It involves checking your breasts regularly and reporting any changes to your health care provider. Practicing breast self-awareness is important. A change in your breasts can be a sign of a serious medical problem. Being familiar with how your breasts look and feel allows you to find any problems early, when treatment is more likely to be successful. All women should practice breast self-awareness, including women who have had breast implants. How to do a breast self-exam One way to learn what is normal for your breasts and whether your breasts are changing is to do a breast self-exam. To do a breast self-exam: Look for Changes   1. Remove all the clothing above your waist. 2. Stand in front of a mirror in a room with good lighting. 3. Put your hands on your hips. 4. Push your hands firmly downward. 5. Compare your breasts in the mirror. Look for differences between them (asymmetry), such as:  Differences in shape.  Differences in size.  Puckers, dips, and bumps in one breast and not the other. 6. Look at each breast for changes in your skin, such as:  Redness.  Scaly areas. 7. Look for changes in your nipples, such as:  Discharge.  Bleeding.  Dimpling.  Redness.  A change in position. Feel for Changes   Carefully feel your breasts for lumps and changes. It is best to do this while lying on your back on the floor and again while sitting or standing in the shower or tub with soapy water on your skin. Feel each breast in the following way:  Place the arm on the side of the breast you are examining above your head.  Feel your breast with the other hand.  Start in the nipple area and make  inch (2 cm) overlapping circles to feel your breast. Use the pads of your three middle fingers to do this. Apply light pressure, then medium pressure, then firm pressure. The light pressure  will allow you to feel the tissue closest to the skin. The medium pressure will allow you to feel the tissue that is a little deeper. The firm pressure will allow you to feel the tissue close to the ribs.  Continue the overlapping circles, moving downward over the breast until you feel your ribs below your breast.  Move one finger-width toward the center of the body. Continue to use the  inch (2 cm) overlapping circles to feel your breast as you move slowly up toward your collarbone.  Continue the up and down exam using all three pressures until you reach your armpit. Write Down What You Find   Write down what is normal for each breast and any changes that you find. Keep a written record with breast changes or normal findings for each breast. By writing this information down, you do not need to depend only on memory for size, tenderness, or location. Write down where you are in your menstrual cycle, if you are still menstruating. If you are having trouble noticing differences in your breasts, do not get discouraged. With time you will become more familiar with the variations in your breasts and more comfortable with the exam. How often should I examine my breasts? Examine your breasts every month. If you are breastfeeding, the best time to examine your breasts is after a feeding or after using a breast pump. If you menstruate, the best time to examine your breasts is 5-7 days  after your period is over. During your period, your breasts are lumpier, and it may be more difficult to notice changes. When should I see my health care provider? See your health care provider if you notice:  A change in shape or size of your breasts or nipples.  A change in the skin of your breast or nipples, such as a reddened or scaly area.  Unusual discharge from your nipples.  A lump or thick area that was not there before.  Pain in your breasts.  Anything that concerns you. This information is not intended to  replace advice given to you by your health care provider. Make sure you discuss any questions you have with your health care provider. Document Released: 08/01/2005 Document Revised: 01/07/2016 Document Reviewed: 06/21/2015 Elsevier Interactive Patient Education  2017 [employer].

## 2016-12-16 NOTE — Progress Notes (Signed)
Patient ID: Crystal Jenkins, female   DOB: 05-21-1986, 31 y.o.   MRN: [id number]  Routine exam and restart depo provera  HPI Crystal Jenkins is a 31 y.o. female.  [id number] Patient's last menstrual period was 12/06/2016 (exact date). Denies intercourse for more than 2 weeks. She wishes to restart DMPA HPI    Past Surgical History:  Procedure Laterality Date  . CESAREAN SECTION    . CESAREAN SECTION N/A 07/30/2014   Procedure: CESAREAN SECTION;  Surgeon: Truett Mainland, DO;  Location: [facility];  Service: Obstetrics;  Laterality: N/A;  . CRYOTHERAPY    . DILATION AND CURETTAGE OF UTERUS    . LAPAROTOMY     removal of ectopic preg    Family History  Problem Relation Age of Onset  . Hypertension Mother   . Stroke Mother   . Heart disease Mother   . Epilepsy Mother   . Heart disease Paternal Aunt   . Hypertension Maternal Grandmother   . Diabetes Maternal Grandmother   . Cancer Maternal Grandmother     Social History Social History  Substance Use Topics  . Smoking status: Former Smoker    Types: Cigarettes    Quit date: 11/11/2013  . Smokeless tobacco: Never Used     Comment: social somoker, quit with +preg  . Alcohol use No     Comment: occasionally    Allergies  Allergen Reactions  . Bactrim [Sulfamethoxazole-Trimethoprim] Other (See Comments)    Constipation   . Oxycodone Itching    No current outpatient prescriptions on file.   Current Facility-Administered Medications  Medication Dose Route Frequency Provider Last Rate Last Dose  . medroxyPROGESTERone (DEPO-PROVERA) injection 150 mg  150 mg Intramuscular Once Woodroe Mode, MD        Review of Systems Review of Systems  Constitutional: Negative.   Gastrointestinal: Negative.   Genitourinary: Negative for dysuria, vaginal bleeding, vaginal discharge and vaginal pain.    Blood pressure (!) 141/81, pulse 88, weight 130 lb (59 kg), last menstrual period 12/06/2016.  Physical Exam Physical Exam   Constitutional: She is oriented to person, place, and time. She appears well-developed.  Cardiovascular: Normal rate.   Pulmonary/Chest: Effort normal. No respiratory distress.  Breasts: breasts appear normal, no suspicious masses, no skin or nipple changes or axillary nodes.   Abdominal: Soft. She exhibits no distension and no mass.  Genitourinary:  Genitourinary Comments: Pelvic exam: normal external genitalia, vulva, vagina, cervix, uterus and adnexa, pap done.   Musculoskeletal: Normal range of motion.  Neurological: She is alert and oriented to person, place, and time.  Skin: Skin is warm.  Psychiatric: She has a normal mood and affect. Her behavior is normal.  Vitals reviewed.   Data Reviewed Pap result and ED notes  Assessment    Patient Active Problem List   Diagnosis Date Noted  . Bartholin's gland abscess 12/16/2016  . Migraine with aura 09/20/2013  well woman exam Initiation of depo provera     Plan    Depo provera IM  RTC 3 months Pap result f/u        Emeterio Reeve 12/16/2016, 8:32 AM

## 2016-12-20 LAB — CYTOLOGY - PAP
DIAGNOSIS: NEGATIVE
HPV: NOT DETECTED

## 2017-03-06 ENCOUNTER — Ambulatory Visit (INDEPENDENT_AMBULATORY_CARE_PROVIDER_SITE_OTHER): Payer: Medicaid Other | Admitting: General Practice

## 2017-03-06 VITALS — BP 124/78 | HR 75 | Ht 62.0 in | Wt 137.7 lb

## 2017-03-06 DIAGNOSIS — Z3042 Encounter for surveillance of injectable contraceptive: Secondary | ICD-10-CM

## 2017-03-06 DIAGNOSIS — Z3049 Encounter for surveillance of other contraceptives: Secondary | ICD-10-CM

## 2017-03-06 MED ORDER — MEDROXYPROGESTERONE ACETATE 150 MG/ML IM SUSP
150.0000 mg | Freq: Once | INTRAMUSCULAR | Status: AC
  Administered 2017-03-06: 150 mg via INTRAMUSCULAR

## 2017-05-22 ENCOUNTER — Ambulatory Visit (INDEPENDENT_AMBULATORY_CARE_PROVIDER_SITE_OTHER): Payer: Medicaid Other | Admitting: General Practice

## 2017-05-22 VITALS — BP 138/89 | HR 78 | Ht 62.0 in | Wt 147.0 lb

## 2017-05-22 DIAGNOSIS — Z3049 Encounter for surveillance of other contraceptives: Secondary | ICD-10-CM

## 2017-05-22 DIAGNOSIS — Z3042 Encounter for surveillance of injectable contraceptive: Secondary | ICD-10-CM

## 2017-05-22 MED ORDER — MEDROXYPROGESTERONE ACETATE 150 MG/ML IM SUSP
150.0000 mg | Freq: Once | INTRAMUSCULAR | Status: AC
  Administered 2017-05-22: 150 mg via INTRAMUSCULAR

## 2017-06-29 ENCOUNTER — Telehealth: Payer: Self-pay | Admitting: *Deleted

## 2017-06-29 NOTE — Telephone Encounter (Signed)
Per Dr. Jaynee Eagles, pt referral from Dr. Katy Fitch. I called the patient and scheduled her for Monday 11/19 @ 08:30 AM arrival time 08:00. She verbalized understanding and appreciation.

## 2017-07-03 ENCOUNTER — Encounter: Payer: Self-pay | Admitting: Neurology

## 2017-07-03 ENCOUNTER — Telehealth: Payer: Self-pay | Admitting: Neurology

## 2017-07-03 ENCOUNTER — Ambulatory Visit: Payer: Self-pay | Admitting: Neurology

## 2017-07-03 VITALS — BP 136/91 | HR 66 | Ht 62.0 in | Wt 148.4 lb

## 2017-07-03 DIAGNOSIS — R51 Headache with orthostatic component, not elsewhere classified: Secondary | ICD-10-CM

## 2017-07-03 DIAGNOSIS — H905 Unspecified sensorineural hearing loss: Secondary | ICD-10-CM

## 2017-07-03 DIAGNOSIS — IMO0002 Reserved for concepts with insufficient information to code with codable children: Secondary | ICD-10-CM

## 2017-07-03 DIAGNOSIS — H5461 Unqualified visual loss, right eye, normal vision left eye: Secondary | ICD-10-CM

## 2017-07-03 DIAGNOSIS — R519 Headache, unspecified: Secondary | ICD-10-CM

## 2017-07-03 DIAGNOSIS — H9190 Unspecified hearing loss, unspecified ear: Secondary | ICD-10-CM

## 2017-07-03 DIAGNOSIS — R42 Dizziness and giddiness: Secondary | ICD-10-CM

## 2017-07-03 DIAGNOSIS — G43709 Chronic migraine without aura, not intractable, without status migrainosus: Secondary | ICD-10-CM

## 2017-07-03 DIAGNOSIS — G4440 Drug-induced headache, not elsewhere classified, not intractable: Secondary | ICD-10-CM

## 2017-07-03 MED ORDER — NONFORMULARY OR COMPOUNDED ITEM: 1.0000 "application " | 11 refills | Status: DC

## 2017-07-03 MED ORDER — ONDANSETRON HCL 4 MG PO TABS: 4.0000 mg | ORAL_TABLET | Freq: Three times a day (TID) | ORAL | 12 refills | Status: DC | PRN

## 2017-07-03 MED ORDER — NORTRIPTYLINE HCL 25 MG PO CAPS: 25.0000 mg | ORAL_CAPSULE | Freq: Every day | ORAL | 11 refills | Status: DC

## 2017-07-03 MED ORDER — FREMANEZUMAB-VFRM 225 MG/1.5ML ~~LOC~~ SOSY: 1.0000 "pen " | PREFILLED_SYRINGE | SUBCUTANEOUS | 0 refills | Status: DC

## 2017-07-03 MED ORDER — RIZATRIPTAN BENZOATE 10 MG PO TABS: 10.0000 mg | ORAL_TABLET | ORAL | 11 refills | Status: DC | PRN

## 2017-07-03 NOTE — Patient Instructions (Addendum)
Start 25mg  Nortriptyline(Pamelor) as preventative Zofran(ondansetron) as needed for nausea Slowly over the next 4 weeks decrease Ibuprofen Maxalt for acute management: Please take one tablet at the onset of your headache. If it does not improve the symptoms please take one additional tablet in 2 hours. Do not take more then 2 tablets in 24hrs. Do not take use more then 2 to 3 times in a week. [facility] financial assistance Start Ajovy today and provide sample for the next 2 months MRI brain when insured or with [facility] financial assistance    Migraine Headache A migraine headache is an intense, throbbing pain on one side or both sides of the head. Migraines may also cause other symptoms, such as nausea, vomiting, and sensitivity to light and noise. What are the causes? Doing or taking certain things may also trigger migraines, such as:  Alcohol.  Smoking.  Medicines, such as: ? Medicine used to treat chest pain (nitroglycerine). ? Birth control pills. ? Estrogen pills. ? Certain blood pressure medicines.  Aged cheeses, chocolate, or caffeine.  Foods or drinks that contain nitrates, glutamate, aspartame, or tyramine.  Physical activity.  Other things that may trigger a migraine include:  Menstruation.  Pregnancy.  Hunger.  Stress, lack of sleep, too much sleep, or fatigue.  Weather changes.  What increases the risk? The following factors may make you more likely to experience migraine headaches:  Age. Risk increases with age.  Family history of migraine headaches.  Being Caucasian.  Depression and anxiety.  Obesity.  Being a woman.  Having a hole in the heart (patent foramen ovale) or other heart problems.  What are the signs or symptoms? The main symptom of this condition is pulsating or throbbing pain. Pain may:  Happen in any area of the head, such as on one side or both sides.  Interfere with daily activities.  Get worse with physical activity.  Get  worse with exposure to bright lights or loud noises.  Other symptoms may include:  Nausea.  Vomiting.  Dizziness.  General sensitivity to bright lights, loud noises, or smells.  Before you get a migraine, you may get warning signs that a migraine is developing (aura). An aura may include:  Seeing flashing lights or having blind spots.  Seeing bright spots, halos, or zigzag lines.  Having tunnel vision or blurred vision.  Having numbness or a tingling feeling.  Having trouble talking.  Having muscle weakness.  How is this diagnosed? A migraine headache can be diagnosed based on:  Your symptoms.  A physical exam.  Tests, such as CT scan or MRI of the head. These imaging tests can help rule out other causes of headaches.  Taking fluid from the spine (lumbar puncture) and analyzing it (cerebrospinal fluid analysis, or CSF analysis).  How is this treated? A migraine headache is usually treated with medicines that:  Relieve pain.  Relieve nausea.  Prevent migraines from coming back.  Treatment may also include:  Acupuncture.  Lifestyle changes like avoiding foods that trigger migraines.  Follow these instructions at home: Medicines  Take over-the-counter and prescription medicines only as told by your health care provider.  Do not drive or use heavy machinery while taking prescription pain medicine.  To prevent or treat constipation while you are taking prescription pain medicine, your health care provider may recommend that you: ? Drink enough fluid to keep your urine clear or pale yellow. ? Take over-the-counter or prescription medicines. ? Eat foods that are high in fiber, such as  fresh fruits and vegetables, whole grains, and beans. ? Limit foods that are high in fat and processed sugars, such as fried and sweet foods. Lifestyle  Avoid alcohol use.  Do not use any products that contain nicotine or tobacco, such as cigarettes and e-cigarettes. If you  need help quitting, ask your health care provider.  Get at least 8 hours of sleep every night.  Limit your stress. General instructions   Keep a journal to find out what may trigger your migraine headaches. For example, write down: ? What you eat and drink. ? How much sleep you get. ? Any change to your diet or medicines.  If you have a migraine: ? Avoid things that make your symptoms worse, such as bright lights. ? It may help to lie down in a dark, quiet room. ? Do not drive or use heavy machinery. ? Ask your health care provider what activities are safe for you while you are experiencing symptoms.  Keep all follow-up visits as told by your health care provider. This is important. Contact a health care provider if:  You develop symptoms that are different or more severe than your usual migraine symptoms. Get help right away if:  Your migraine becomes severe.  You have a fever.  You have a stiff neck.  You have vision loss.  Your muscles feel weak or like you cannot control them.  You start to lose your balance often.  You develop trouble walking.  You faint. This information is not intended to replace advice given to you by your health care provider. Make sure you discuss any questions you have with your health care provider. Document Released: 08/01/2005 Document Revised: 02/19/2016 Document Reviewed: 01/18/2016 Elsevier Interactive Patient Education  2017 [employer].

## 2017-07-03 NOTE — Telephone Encounter (Signed)
I have talked to Crystal Jenkins and gave her all Crystal Jenkins assistance paper work for her to get enrolled for [facility] assistance . I relayed to Crystal Jenkins she will have to start process her self. I relayed to Crystal Jenkins if she goes this week she will most likely get a approval with in a couple of weeks.

## 2017-07-03 NOTE — Progress Notes (Signed)
[facility]    Provider:  Dr Jaynee Eagles Referring Provider: Warden Fillers  CC:  Migraine and vertigo  HPI:  Crystal Jenkins is a 31 y.o. female here as a referral from Dr. Katy Fitch for migraines and vertigo. She has vertigo and photophobia and nausea and vomiting. Light and sound make her migraines worse. Sleeping and a dark room helps. She has had vertigo years associated with the headache. For the past 5 months she has not been sleeping well, she wakes up in the moddle of the night and can;t get back to sleep. Her children 9,11 and 3. The migraine starts on the right occipitally and is pounding bilaterally, severe light and soun sensitivity, +nausea and vomiting. Movement makes it worse. Dark room helps. Sleeping helps. She has daily headaches, morning headaches and positionally worse. Migraines can last up to 24 hours, she has been to the ED, they can be severe. Starts slowly later in the day and gets worse. Ongoing daily for 3 years, she has vision loss of the right eye. The vertigo is severe and can occur with and without headache. Hearing is ok. Worsening in severity. Mother with migraines.  No aura. +Medication overuse. She is not pregnant, or planning pregnancy, using birth control. Discussed teratogenicity of medications, do not get pregnant  Meds tried: Pamelor, flexeril, ibuprofen, reglan injection in the ED, zofran in the emergency room, phenergan, scopolamine patch, tramadol  Reviewed notes, labs and imaging from outside physicians, which showed:  Ct showed No acute intracranial abnormalities including mass lesion or mass effect, hydrocephalus, extra-axial fluid collection, midline shift, hemorrhage, or acute infarction, large ischemic events (personally reviewed images)   Review of Systems: Patient complains of symptoms per HPI as well as the following symptoms: headache, dizziness, anxiety, decreased energy, insomnia. Pertinent negatives and positives per HPI. All  others negative.   Social History   Socioeconomic History  . Marital status: Single    Spouse name: Not on file  . Number of children: 3  . Years of education: Associates  . Highest education level: Not on file  Social Needs  . Financial resource strain: Not on file  . Food insecurity - worry: Not on file  . Food insecurity - inability: Not on file  . Transportation needs - medical: Not on file  . Transportation needs - non-medical: Not on file  Occupational History    Employer: [employer]  Tobacco Use  . Smoking status: Former Smoker    Types: Cigarettes    Last attempt to quit: 11/11/2013    Years since quitting: 3.6  . Smokeless tobacco: Never Used  . Tobacco comment: social somoker, quit with +preg  Substance and Sexual Activity  . Alcohol use: No    Comment: very rare  . Drug use: No  . Sexual activity: No    Birth control/protection: Injection    Comment: wants depo provera  Other Topics Concern  . Not on file  Social History Narrative   Patient is single, has 3 children   Patient is right handed   Education level is Associate's degree   Drinks 1-2 cups of caffeine daily    Family History  Problem Relation Age of Onset  . Hypertension Mother   . Stroke Mother   . Heart disease Mother   . Epilepsy Mother   . HIV/AIDS Father   . Heart disease Paternal Aunt   . Deep vein thrombosis Paternal Aunt   . Hypertension Maternal Grandmother   . Diabetes Maternal  Grandmother   . Cancer Maternal Grandmother     Past Medical History:  Diagnosis Date  . Adenocarcinoma of Bartholin's gland, stage 1 ([place])   . Cancer ([place])    cervical  . Extrauterine pregnancy   . Headache(784.0)   . Infection    UTI  . MRSA (methicillin resistant Staphylococcus aureus)   . Sleeping difficulties    wakes up w/ trouble going back to sleep  . Vaginal Pap smear, abnormal    2005; had cryo  . Vertigo   . Vertigo     Past Surgical History:  Procedure Laterality Date    . CESAREAN SECTION  2007  . CESAREAN SECTION  2009  . CESAREAN SECTION N/A 07/30/2014   Performed by Truett Mainland, DO at [facility]  . CRYOTHERAPY    . DILATION AND CURETTAGE OF UTERUS    . LAPAROTOMY     removal of ectopic preg    Current Outpatient Medications  Medication Sig Dispense Refill  . DimenhyDRINATE (DRAMAMINE PO) Take 1 tablet as needed by mouth (dizziness).    Marland Kitchen ibuprofen (ADVIL,MOTRIN) 200 MG tablet Take 200-600 mg every 8 (eight) hours as needed by mouth for headache.    . Fremanezumab-vfrm (AJOVY) 225 MG/1.5ML SOSY Inject 1 pen every 30 (thirty) days into the skin. 3 Syringe 0  . NONFORMULARY OR COMPOUNDED ITEM Inject 1 application every 30 (thirty) days into the skin. 1 each 11  . nortriptyline (PAMELOR) 25 MG capsule Take 1 capsule (25 mg total) at bedtime by mouth. 30 capsule 11  . ondansetron (ZOFRAN) 4 MG tablet Take 1 tablet (4 mg total) every 8 (eight) hours as needed by mouth for nausea or vomiting. 20 tablet 12  . rizatriptan (MAXALT) 10 MG tablet Take 1 tablet (10 mg total) as needed by mouth for migraine. May repeat in 2 hours if needed 10 tablet 11   No current facility-administered medications for this visit.     Allergies as of 07/03/2017 - Review Complete 07/03/2017  Allergen Reaction Noted  . Bactrim [sulfamethoxazole-trimethoprim] Other (See Comments) 02/19/2013  . Oxycodone Itching 02/19/2013    Vitals: BP (!) 136/91 (BP Location: Right Arm, Patient Position: Sitting)   Pulse 66   Ht 5\' 2"  (1.575 m)   Wt 148 lb 6.4 oz (67.3 kg)   BMI 27.14 kg/m  Last Weight:  Wt Readings from Last 1 Encounters:  07/03/17 148 lb 6.4 oz (67.3 kg)   Last Height:   Ht Readings from Last 1 Encounters:  07/03/17 5\' 2"  (1.575 m)    Physical exam: Exam: Gen: NAD, conversant, well nourised, well groomed                     CV: RRR, no MRG. No Carotid Bruits. No peripheral edema, warm, nontender Eyes: Conjunctivae clear without exudates or  hemorrhage  Neuro: Detailed Neurologic Exam  Speech:    Speech is normal; fluent and spontaneous with normal comprehension.  Cognition:    The patient is oriented to person, place, and time;     recent and remote memory intact;     language fluent;     normal attention, concentration,     fund of knowledge Cranial Nerves:    The pupils are equal, round, and reactive to light. The fundi are normal and spontaneous venous pulsations are present. Visual fields are full to finger confrontation. Extraocular movements are intact. Trigeminal sensation is intact and the muscles of mastication are normal. The face  is symmetric. The palate elevates in the midline. Hearing intact. Voice is normal. Shoulder shrug is normal. The tongue has normal motion without fasciculations.   Coordination:    Normal finger to nose and heel to shin. Normal rapid alternating movements.   Gait:    Heel-toe and tandem gait are normal.   Motor Observation:    No asymmetry, no atrophy, and no involuntary movements noted. Tone:    Normal muscle tone.    Posture:    Posture is normal. normal erect    Strength:    Strength is V/V in the upper and lower limbs.      Sensation: intact to LT     Reflex Exam:  DTR's:    Deep tendon reflexes in the upper and lower extremities are normal bilaterally.   Toes:    The toes are downgoing bilaterally.   Clonus:    Clonus is absent.      Assessment/Plan:  31 year old with chronic migraines with a component of ibuprofen overuse causing rebound headaches  Start 25mg  Nortriptyline as preventative Zofran as needed for nausea Slowly over the next 4 weeks decrease Ibuprofen (medication overuse/rebound) Maxalt for acute management [facility] financial assistance Start Ajovy today and provided sample for the next 2 months MRI brain when insured or with [facility] financial assistance, due to positional quality with vision and hearing changes to eval for space-occupying mass or  other lesions Labs Patient is uninsured, asked her to email me in a few weeks on progress and helping her with [facility] financial assistance  Orders Placed This Encounter  Procedures  . MR BRAIN W WO CONTRAST  . CBC  . Comprehensive metabolic panel    Discussed: To prevent or relieve headaches, try the following: Cool Compress. Lie down and place a cool compress on your head.  Avoid headache triggers. If certain foods or odors seem to have triggered your migraines in the past, avoid them. A headache diary might help you identify triggers.  Include physical activity in your daily routine. Try a daily walk or other moderate aerobic exercise.  Manage stress. Find healthy ways to cope with the stressors, such as delegating tasks on your to-do list.  Practice relaxation techniques. Try deep breathing, yoga, massage and visualization.  Eat regularly. Eating regularly scheduled meals and maintaining a healthy diet might help prevent headaches. Also, drink plenty of fluids.  Follow a regular sleep schedule. Sleep deprivation might contribute to headaches Consider biofeedback. With this mind-body technique, you learn to control certain bodily functions - such as muscle tension, heart rate and blood pressure - to prevent headaches or reduce headache pain.    Proceed to emergency room if you experience new or worsening symptoms or symptoms do not resolve, if you have new neurologic symptoms or if headache is severe, or for any concerning symptom.   Provided education and documentation from American headache Society toolbox including articles on: chronic migraine medication overuse headache, chronic migraines, prevention of migraines, behavioral and other nonpharmacologic treatments for headache.  Cc: Warden Fillers  Sarina Ill, MD  [facility] [address] [place], [place] [address]  Phone [phone] Fax [phone]

## 2017-07-03 NOTE — Progress Notes (Signed)
Ajovy injection 225 mg/1.5 mL administered in LUOQ of abdomen. Patient tolerated well. Patient educated on how to give herself future injections; keep doses refrigerated, let one syringe sit at room temperature for 30 minutes prior to injecting; cleanse site with alcohol swab; inject like a dart in subcutaneous tissue; rotate site every 30 days; can use abdomen (> 2 inches from umbilicus), upper outer thigh, back of arm. She will dispose of used syringe/needle in sharps container. Bandaid to site if needed. Site may be sore, call MD for any signs of infection to site. Patient verbalized understanding, no concerns verbalized by patient.

## 2017-08-10 ENCOUNTER — Ambulatory Visit: Payer: Medicaid Other

## 2017-08-18 ENCOUNTER — Ambulatory Visit: Payer: Medicaid Other

## 2018-07-17 ENCOUNTER — Encounter (HOSPITAL_COMMUNITY): Payer: Self-pay | Admitting: *Deleted

## 2018-07-17 ENCOUNTER — Inpatient Hospital Stay (HOSPITAL_COMMUNITY)
Admission: AD | Admit: 2018-07-17 | Discharge: 2018-07-17 | Disposition: A | Discharge status: Home / Self Care | Payer: Medicaid Other | Attending: Obstetrics and Gynecology | Admitting: Obstetrics and Gynecology

## 2018-07-17 DIAGNOSIS — Z87891 Personal history of nicotine dependence: Secondary | ICD-10-CM | POA: Insufficient documentation

## 2018-07-17 DIAGNOSIS — N3001 Acute cystitis with hematuria: Secondary | ICD-10-CM

## 2018-07-17 DIAGNOSIS — Z3202 Encounter for pregnancy test, result negative: Secondary | ICD-10-CM | POA: Insufficient documentation

## 2018-07-17 LAB — URINALYSIS, ROUTINE W REFLEX MICROSCOPIC
BILIRUBIN URINE: NEGATIVE
Glucose, UA: NEGATIVE mg/dL
KETONES UR: NEGATIVE mg/dL
Nitrite: NEGATIVE
Protein, ur: NEGATIVE mg/dL
SPECIFIC GRAVITY, URINE: 1.032 — AB (ref 1.005–1.030)
pH: 5 (ref 5.0–8.0)

## 2018-07-17 LAB — POCT PREGNANCY, URINE: PREG TEST UR: NEGATIVE

## 2018-07-17 MED ORDER — NITROFURANTOIN MONOHYD MACRO 100 MG PO CAPS: 100.0000 mg | ORAL_CAPSULE | Freq: Two times a day (BID) | ORAL | 0 refills | Status: DC

## 2018-07-17 MED ORDER — PHENAZOPYRIDINE HCL 200 MG PO TABS: 200.0000 mg | ORAL_TABLET | Freq: Three times a day (TID) | ORAL | 0 refills | Status: AC

## 2018-07-17 MED ORDER — NORETHINDRONE 0.35 MG PO TABS: 1.0000 | ORAL_TABLET | Freq: Every day | ORAL | 11 refills | Status: DC

## 2018-07-17 NOTE — Discharge Instructions (Signed)

## 2018-07-17 NOTE — MAU Provider Note (Signed)
Chief Complaint: Urinary Tract Infection   First Provider Initiated Contact with Patient 07/17/18 2047     SUBJECTIVE HPI: Crystal Jenkins is a 32 y.o. [id number] who presents to maternity admissions for a possible UTI and pregnancy test. She reports urgency and frequency but only small amounts of urine coming out. Denies any pain at this time. Denies any vaginal bleeding or discharge. She is having unprotected intercourse and is concerned she may be pregnant. LMP 06/10/2018  Past Medical History:  Diagnosis Date  . Adenocarcinoma of Bartholin's gland, stage 1 ([place])   . Cancer ([place])    cervical  . Extrauterine pregnancy   . Headache(784.0)   . Infection    UTI  . MRSA (methicillin resistant Staphylococcus aureus)   . Sleeping difficulties    wakes up w/ trouble going back to sleep  . Vaginal Pap smear, abnormal    2005; had cryo  . Vertigo   . Vertigo    Past Surgical History:  Procedure Laterality Date  . CESAREAN SECTION  2007  . CESAREAN SECTION N/A 07/30/2014   Procedure: CESAREAN SECTION;  Surgeon: Truett Mainland, DO;  Location: [facility];  Service: Obstetrics;  Laterality: N/A;  . CESAREAN SECTION  2009  . CRYOTHERAPY    . DILATION AND CURETTAGE OF UTERUS    . LAPAROTOMY     removal of ectopic preg   Social History   Socioeconomic History  . Marital status: Single    Spouse name: Not on file  . Number of children: 3  . Years of education: Associates  . Highest education level: Not on file  Occupational History    Employer: [employer]  . Financial resource strain: Not on file  . Food insecurity:    Worry: Not on file    Inability: Not on file  . Transportation needs:    Medical: Not on file    Non-medical: Not on file  Tobacco Use  . Smoking status: Former Smoker    Types: Cigarettes    Last attempt to quit: 11/11/2013    Years since quitting: 4.6  . Smokeless tobacco: Never Used  . Tobacco comment: social somoker, quit with +preg   Substance and Sexual Activity  . Alcohol use: No    Comment: very rare  . Drug use: No  . Sexual activity: Never    Birth control/protection: Injection    Comment: wants depo provera  Lifestyle  . Physical activity:    Days per week: Not on file    Minutes per session: Not on file  . Stress: Not on file  Relationships  . Social connections:    Talks on phone: Not on file    Gets together: Not on file    Attends religious service: Not on file    Active member of club or organization: Not on file    Attends meetings of clubs or organizations: Not on file    Relationship status: Not on file  . Intimate partner violence:    Fear of current or ex partner: Not on file    Emotionally abused: Not on file    Physically abused: Not on file    Forced sexual activity: Not on file  Other Topics Concern  . Not on file  Social History Narrative   Patient is single, has 3 children   Patient is right handed   Education level is Associate's degree   Drinks 1-2 cups of caffeine daily   No current  facility-administered medications on file prior to encounter.    Current Outpatient Medications on File Prior to Encounter  Medication Sig Dispense Refill  . DimenhyDRINATE (DRAMAMINE PO) Take 1 tablet as needed by mouth (dizziness).    . Fremanezumab-vfrm (AJOVY) 225 MG/1.5ML SOSY Inject 1 pen every 30 (thirty) days into the skin. 3 Syringe 0  . ibuprofen (ADVIL,MOTRIN) 200 MG tablet Take 200-600 mg every 8 (eight) hours as needed by mouth for headache.    . NONFORMULARY OR COMPOUNDED ITEM Inject 1 application every 30 (thirty) days into the skin. 1 each 11  . nortriptyline (PAMELOR) 25 MG capsule Take 1 capsule (25 mg total) at bedtime by mouth. 30 capsule 11  . ondansetron (ZOFRAN) 4 MG tablet Take 1 tablet (4 mg total) every 8 (eight) hours as needed by mouth for nausea or vomiting. 20 tablet 12  . rizatriptan (MAXALT) 10 MG tablet Take 1 tablet (10 mg total) as needed by mouth for migraine.  May repeat in 2 hours if needed 10 tablet 11   Allergies  Allergen Reactions  . Bactrim [Sulfamethoxazole-Trimethoprim] Other (See Comments)    Constipation   . Oxycodone Itching    ROS:  Review of Systems  Constitutional: Negative.  Negative for fatigue and fever.  HENT: Negative.   Respiratory: Negative.  Negative for shortness of breath.   Cardiovascular: Negative.  Negative for chest pain.  Gastrointestinal: Negative.  Negative for abdominal pain, constipation, diarrhea, nausea and vomiting.  Genitourinary: Positive for dysuria and frequency. Negative for vaginal bleeding and vaginal discharge.  Neurological: Negative.  Negative for dizziness and headaches.    I have reviewed patient's Past Medical Hx, Surgical Hx, Family Hx, Social Hx, medications and allergies.   Physical Exam   Patient Vitals for the past 24 hrs:  BP Temp Temp src Pulse Resp Height Weight  07/17/18 1936 126/84 98.8 F (37.1 C) Oral 81 18 5\' 2"  (1.575 m) 69.9 kg   Physical Exam  Nursing note and vitals reviewed. Constitutional: She is oriented to person, place, and time. She appears well-developed and well-nourished. No distress.  HENT:  Head: Normocephalic.  Eyes: Pupils are equal, round, and reactive to light.  Cardiovascular: Normal rate, regular rhythm and normal heart sounds.  Respiratory: Effort normal and breath sounds normal. No respiratory distress.  GI: Soft. Bowel sounds are normal. She exhibits no distension. There is no tenderness.  Neurological: She is alert and oriented to person, place, and time.  Skin: Skin is warm and dry.  Psychiatric: She has a normal mood and affect. Her behavior is normal. Judgment and thought content normal.   Results for orders placed or performed during the hospital encounter of 07/17/18 (from the past 24 hour(s))  Urinalysis, Routine w reflex microscopic     Status: Abnormal   Collection Time: 07/17/18  7:40 PM  Result Value Ref Range   Color, Urine YELLOW  YELLOW   APPearance HAZY (A) CLEAR   Specific Gravity, Urine 1.032 (H) 1.005 - 1.030   pH 5.0 5.0 - 8.0   Glucose, UA NEGATIVE NEGATIVE mg/dL   Hgb urine dipstick MODERATE (A) NEGATIVE   Bilirubin Urine NEGATIVE NEGATIVE   Ketones, ur NEGATIVE NEGATIVE mg/dL   Protein, ur NEGATIVE NEGATIVE mg/dL   Nitrite NEGATIVE NEGATIVE   Leukocytes, UA SMALL (A) NEGATIVE   RBC / HPF 0-5 0 - 5 RBC/hpf   WBC, UA 21-50 0 - 5 WBC/hpf   Bacteria, UA RARE (A) NONE SEEN   Squamous Epithelial / LPF  0-5 0 - 5   Mucus PRESENT   Pregnancy, urine POC     Status: None   Collection Time: 07/17/18  7:45 PM  Result Value Ref Range   Preg Test, Ur NEGATIVE NEGATIVE    MDM UA, UPT Small leukocytes on UA- with symptoms, will go ahead and treat and culture.  ASSESSMENT 1. Acute cystitis with hematuria    PLAN Discharge patient  RX for macrobid and pyridium sent to patient's pharmacy Encouraged patient to use condoms every time she has intercourse Encouraged patient to follow up with gyn of choice for routine needs Patient may return to MAU as needed if condition changes or worsens.  Wende Mott, [place] 07/17/2018 8:47 PM

## 2018-07-17 NOTE — MAU Note (Signed)
PT SAYS  THINKS HAS UTI- FEELS  URGENCY - BUT  LITTLE URINE   X2 WEEKS .   FEELS CRAMPING - BUT  NO BLEEDING.   NO BIRTH CONTROL. LAST SEX-  Sunday.  DID UPT ON Sunday- NEG.

## 2018-07-20 LAB — URINE CULTURE: Culture: 50000 — AB

## 2018-09-18 ENCOUNTER — Emergency Department (HOSPITAL_COMMUNITY)
Admission: EM | Admit: 2018-09-18 | Discharge: 2018-09-18 | Discharge status: Against Medical Advice | Payer: BLUE CROSS/BLUE SHIELD | Attending: Emergency Medicine | Admitting: Emergency Medicine

## 2018-09-18 ENCOUNTER — Encounter (HOSPITAL_COMMUNITY): Payer: Self-pay | Admitting: Emergency Medicine

## 2018-09-18 ENCOUNTER — Other Ambulatory Visit: Payer: Self-pay

## 2018-09-18 DIAGNOSIS — Z5321 Procedure and treatment not carried out due to patient leaving prior to being seen by health care provider: Secondary | ICD-10-CM | POA: Diagnosis not present

## 2018-09-18 DIAGNOSIS — M25571 Pain in right ankle and joints of right foot: Secondary | ICD-10-CM | POA: Diagnosis present

## 2018-09-18 NOTE — ED Triage Notes (Signed)
Co R ankle pain and swelling x 3 weeks.  No known injury.

## 2018-09-18 NOTE — ED Notes (Signed)
Pt said she does not want to wait any longer and left.

## 2018-09-19 ENCOUNTER — Ambulatory Visit (HOSPITAL_COMMUNITY)
Admission: EM | Admit: 2018-09-19 | Discharge: 2018-09-19 | Disposition: A | Discharge status: Home / Self Care | Payer: BLUE CROSS/BLUE SHIELD | Attending: Emergency Medicine | Admitting: Emergency Medicine

## 2018-09-19 ENCOUNTER — Ambulatory Visit (INDEPENDENT_AMBULATORY_CARE_PROVIDER_SITE_OTHER): Payer: BLUE CROSS/BLUE SHIELD

## 2018-09-19 ENCOUNTER — Encounter (HOSPITAL_COMMUNITY): Payer: Self-pay | Admitting: Emergency Medicine

## 2018-09-19 DIAGNOSIS — S93411A Sprain of calcaneofibular ligament of right ankle, initial encounter: Secondary | ICD-10-CM

## 2018-09-19 MED ORDER — NAPROXEN 375 MG PO TABS: 375.0000 mg | ORAL_TABLET | Freq: Two times a day (BID) | ORAL | 0 refills | Status: DC

## 2018-09-19 NOTE — ED Triage Notes (Signed)
Pt here with right ankle pain x 3 weeks; no known injury; pt seen in ED recently for same

## 2018-09-19 NOTE — Discharge Instructions (Signed)
X-rays did not show fracture or dislocation ASO brace placed Continue conservative management of rest, ice, elevation Take naproxen as needed for pain relief (may cause abdominal discomfort, ulcers, and GI bleeds avoid taking with other NSAIDs) Follow up with PCP or with orthopedist if symptoms persist Return or go to the ER if you have any new or worsening symptoms (fever, chills, worsening pain, chest pain, abdominal pain, changes in bowel or bladder habits, etc...)

## 2018-09-19 NOTE — ED Provider Notes (Signed)
Crystal Jenkins   [id number] 09/19/18 Arrival Time: 6629  CC: Right ankle pain  SUBJECTIVE: History from: patient. Crystal Jenkins is a 33 y.o. female complains of right ankle pain that began 3 weeks ago.  Reports tripping and falling while she was intoxicated and wearing heels 4 weeks ago. States symptoms improved over that week, but then reoccurred 3 weeks ago.  Localizes the pain to the outside of ankle.  Describes the pain as constant and throbbing in character.  Has tried OTC medications without relief.  Symptoms are made worse with weight-bearing.  Denies similar symptoms in the past.  Denies fever, chills, erythema, ecchymosis, effusion, weakness, numbness and tingling.      ROS: As per HPI.  Past Medical History:  Diagnosis Date  . Adenocarcinoma of Bartholin's gland, stage 1 ([place])   . Cancer ([place])    cervical  . Extrauterine pregnancy   . Headache(784.0)   . Infection    UTI  . MRSA (methicillin resistant Staphylococcus aureus)   . Sleeping difficulties    wakes up w/ trouble going back to sleep  . Vaginal Pap smear, abnormal    2005; had cryo  . Vertigo   . Vertigo    Past Surgical History:  Procedure Laterality Date  . CESAREAN SECTION  2007  . CESAREAN SECTION N/A 07/30/2014   Procedure: CESAREAN SECTION;  Surgeon: Truett Mainland, DO;  Location: [facility];  Service: Obstetrics;  Laterality: N/A;  . CESAREAN SECTION  2009  . CRYOTHERAPY    . DILATION AND CURETTAGE OF UTERUS    . LAPAROTOMY     removal of ectopic preg   Allergies  Allergen Reactions  . Bactrim [Sulfamethoxazole-Trimethoprim] Other (See Comments)    Constipation   . Oxycodone Itching   No current facility-administered medications on file prior to encounter.    Current Outpatient Medications on File Prior to Encounter  Medication Sig Dispense Refill  . DimenhyDRINATE (DRAMAMINE PO) Take 1 tablet as needed by mouth (dizziness).    . Fremanezumab-vfrm (AJOVY) 225 MG/1.5ML SOSY Inject 1  pen every 30 (thirty) days into the skin. 3 Syringe 0  . ibuprofen (ADVIL,MOTRIN) 200 MG tablet Take 200-600 mg every 8 (eight) hours as needed by mouth for headache.    . nitrofurantoin, macrocrystal-monohydrate, (MACROBID) 100 MG capsule Take 1 capsule (100 mg total) by mouth 2 (two) times daily. (Patient not taking: Reported on 09/19/2018) 10 capsule 0  . NONFORMULARY OR COMPOUNDED ITEM Inject 1 application every 30 (thirty) days into the skin. 1 each 11  . norethindrone (ORTHO MICRONOR) 0.35 MG tablet Take 1 tablet (0.35 mg total) by mouth daily. 1 Package 11  . nortriptyline (PAMELOR) 25 MG capsule Take 1 capsule (25 mg total) at bedtime by mouth. 30 capsule 11  . ondansetron (ZOFRAN) 4 MG tablet Take 1 tablet (4 mg total) every 8 (eight) hours as needed by mouth for nausea or vomiting. 20 tablet 12  . rizatriptan (MAXALT) 10 MG tablet Take 1 tablet (10 mg total) as needed by mouth for migraine. May repeat in 2 hours if needed 10 tablet 11   Social History   Socioeconomic History  . Marital status: Single    Spouse name: Not on file  . Number of children: 3  . Years of education: Associates  . Highest education level: Not on file  Occupational History    Employer: [employer]  . Financial resource strain: Not on file  . Food insecurity:  Worry: Not on file    Inability: Not on file  . Transportation needs:    Medical: Not on file    Non-medical: Not on file  Tobacco Use  . Smoking status: Former Smoker    Types: Cigarettes    Last attempt to quit: 11/11/2013    Years since quitting: 4.8  . Smokeless tobacco: Never Used  . Tobacco comment: social somoker, quit with +preg  Substance and Sexual Activity  . Alcohol use: No    Comment: very rare  . Drug use: No  . Sexual activity: Never    Birth control/protection: Injection    Comment: wants depo provera  Lifestyle  . Physical activity:    Days per week: Not on file    Minutes per session: Not on file   . Stress: Not on file  Relationships  . Social connections:    Talks on phone: Not on file    Gets together: Not on file    Attends religious service: Not on file    Active member of club or organization: Not on file    Attends meetings of clubs or organizations: Not on file    Relationship status: Not on file  . Intimate partner violence:    Fear of current or ex partner: Not on file    Emotionally abused: Not on file    Physically abused: Not on file    Forced sexual activity: Not on file  Other Topics Concern  . Not on file  Social History Narrative   Patient is single, has 3 children   Patient is right handed   Education level is Associate's degree   Drinks 1-2 cups of caffeine daily   Family History  Problem Relation Age of Onset  . Hypertension Mother   . Stroke Mother   . Heart disease Mother   . Epilepsy Mother   . HIV/AIDS Father   . Heart disease Paternal Aunt   . Deep vein thrombosis Paternal Aunt   . Hypertension Maternal Grandmother   . Diabetes Maternal Grandmother   . Cancer Maternal Grandmother     OBJECTIVE:  Vitals:   09/19/18 1718  BP: 114/84  Pulse: 75  Resp: 18  Temp: 98.7 F (37.1 C)  TempSrc: Oral  SpO2: 100%    General appearance: Alert; NAD Head: NCAT Lungs: normal respiratory effort CV: Dorsalis pedis pulse 2+; cap refill <2 secs Musculoskeletal: Right ankle Inspection: Skin warm, dry, clear and intact without obvious erythema, effusion, or ecchymosis.  Palpation: Diffusely TTP over lateral ankle  ROM: FROM active and passive Strength:  5/5 knee flexion, 5/5 knee extension, 5/5 dorsiflexion, 5/5 plantar flexion Skin: warm and dry Neurologic: Ambulates with antalgic gait; Sensation intact about the lower extremities Psychological: alert and cooperative; normal mood and affect  DIAGNOSTIC STUDIES:  Dg Ankle Complete Right  Result Date: 09/19/2018 CLINICAL DATA:  [place] a month ago, worsening pain for 2 weeks. EXAM: RIGHT ANKLE -  COMPLETE 3+ VIEW COMPARISON:  None available for comparison at time of study interpretation. FINDINGS: No fracture deformity nor dislocation. The ankle mortise appears congruent and the tibiofibular syndesmosis intact. No destructive bony lesions. Soft tissue planes are non-suspicious. IMPRESSION: Negative. Electronically Signed   By: Elon Alas M.D.   On: 09/19/2018 18:38     ASSESSMENT & PLAN:  1. Sprain of calcaneofibular ligament of right ankle, initial encounter    Meds ordered this encounter  Medications  . naproxen (NAPROSYN) 375 MG tablet    Sig:  Take 1 tablet (375 mg total) by mouth 2 (two) times daily.    Dispense:  20 tablet    Refill:  0    Order Specific Question:   Supervising Provider    Answer:   Raylene Everts [[id number]]   X-rays did not show fracture or dislocation ASO brace placed Continue conservative management of rest, ice, elevation Take naproxen as needed for pain relief (may cause abdominal discomfort, ulcers, and GI bleeds avoid taking with other NSAIDs) Follow up with PCP or with orthopedist if symptoms persist Return or go to the ER if you have any new or worsening symptoms (fever, chills, worsening pain, chest pain, abdominal pain, changes in bowel or bladder habits, etc...)   Reviewed expectations re: course of current medical issues. Questions answered. Outlined signs and symptoms indicating need for more acute intervention. Patient verbalized understanding. After Visit Summary given.    Lestine Box, PA-C 09/19/18 1914

## 2018-12-01 ENCOUNTER — Other Ambulatory Visit: Payer: Self-pay

## 2018-12-01 ENCOUNTER — Encounter (HOSPITAL_COMMUNITY): Payer: Self-pay

## 2018-12-01 ENCOUNTER — Emergency Department (HOSPITAL_COMMUNITY)
Admission: EM | Admit: 2018-12-01 | Discharge: 2018-12-01 | Disposition: A | Discharge status: Home / Self Care | Payer: BLUE CROSS/BLUE SHIELD | Attending: Emergency Medicine | Admitting: Emergency Medicine

## 2018-12-01 DIAGNOSIS — R509 Fever, unspecified: Secondary | ICD-10-CM | POA: Diagnosis not present

## 2018-12-01 DIAGNOSIS — N751 Abscess of Bartholin's gland: Secondary | ICD-10-CM | POA: Insufficient documentation

## 2018-12-01 DIAGNOSIS — Z8544 Personal history of malignant neoplasm of other female genital organs: Secondary | ICD-10-CM | POA: Diagnosis not present

## 2018-12-01 DIAGNOSIS — Z793 Long term (current) use of hormonal contraceptives: Secondary | ICD-10-CM | POA: Diagnosis not present

## 2018-12-01 DIAGNOSIS — Z8541 Personal history of malignant neoplasm of cervix uteri: Secondary | ICD-10-CM | POA: Insufficient documentation

## 2018-12-01 DIAGNOSIS — Z87891 Personal history of nicotine dependence: Secondary | ICD-10-CM | POA: Diagnosis not present

## 2018-12-01 LAB — BASIC METABOLIC PANEL
Anion gap: 9 (ref 5–15)
BUN: 11 mg/dL (ref 6–20)
CO2: 24 mmol/L (ref 22–32)
Calcium: 8.4 mg/dL — ABNORMAL LOW (ref 8.9–10.3)
Chloride: 104 mmol/L (ref 98–111)
Creatinine, Ser: 0.79 mg/dL (ref 0.44–1.00)
GFR calc Af Amer: >60 mL/min (ref >60)
GFR calc non Af Amer: >60 mL/min (ref >60)
Glucose, Bld: 109 mg/dL — ABNORMAL HIGH (ref 70–99)
Potassium: 3.5 mmol/L (ref 3.5–5.1)
Sodium: 137 mmol/L (ref 135–145)

## 2018-12-01 LAB — CBC WITH DIFFERENTIAL/PLATELET
Abs Immature Granulocytes: 0.07 10*3/uL (ref 0.00–0.07)
Basophils Absolute: 0 10*3/uL (ref 0.0–0.1)
Basophils Relative: 0 %
Eosinophils Absolute: 0.1 10*3/uL (ref 0.0–0.5)
Eosinophils Relative: 0 %
HCT: 37.1 % (ref 36.0–46.0)
Hemoglobin: 12.2 g/dL (ref 12.0–15.0)
Immature Granulocytes: 0 %
Lymphocytes Relative: 18 %
Lymphs Abs: 3.3 10*3/uL (ref 0.7–4.0)
MCH: 31.4 pg (ref 26.0–34.0)
MCHC: 32.9 g/dL (ref 30.0–36.0)
MCV: 95.4 fL (ref 80.0–100.0)
Monocytes Absolute: 1.6 10*3/uL — ABNORMAL HIGH (ref 0.1–1.0)
Monocytes Relative: 9 %
Neutro Abs: 12.7 10*3/uL — ABNORMAL HIGH (ref 1.7–7.7)
Neutrophils Relative %: 73 %
Platelets: 344 10*3/uL (ref 150–400)
RBC: 3.89 MIL/uL (ref 3.87–5.11)
RDW: 13.4 % (ref 11.5–15.5)
WBC: 17.8 10*3/uL — ABNORMAL HIGH (ref 4.0–10.5)
nRBC: 0 % (ref 0.0–0.2)

## 2018-12-01 MED ORDER — SULFAMETHOXAZOLE-TRIMETHOPRIM 800-160 MG PO TABS: 1.0000 | ORAL_TABLET | Freq: Two times a day (BID) | ORAL | 0 refills | Status: AC

## 2018-12-01 MED ORDER — LIDOCAINE-EPINEPHRINE (PF) 2 %-1:200000 IJ SOLN
20.0000 mL | Freq: Once | INTRAMUSCULAR | Status: DC
  Filled 2018-12-01: qty 20

## 2018-12-01 MED ORDER — LIDOCAINE-EPINEPHRINE 2 %-1:100000 IJ SOLN
20.0000 mL | Freq: Once | INTRAMUSCULAR | Status: DC
  Filled 2018-12-01 (×2): qty 20

## 2018-12-01 MED ORDER — HYDROCODONE-ACETAMINOPHEN 5-325 MG PO TABS: 2.0000 | ORAL_TABLET | ORAL | 0 refills | Status: DC | PRN

## 2018-12-01 MED ORDER — ACETAMINOPHEN 500 MG PO TABS
1000.0000 mg | ORAL_TABLET | Freq: Once | ORAL | Status: AC
  Administered 2018-12-01: 1000 mg via ORAL
  Filled 2018-12-01: qty 2

## 2018-12-01 NOTE — Discharge Instructions (Addendum)
Contact a health care provider if: You have a fever. You develop redness, swelling, or pain around your cyst. You have fluid, blood, pus, or a bad smell coming from your cyst. You have a cyst that gets larger or comes back.

## 2018-12-01 NOTE — ED Triage Notes (Signed)
Onset 3-4 days ago pt states she has left sided bartholin cyst.  Has tried sitz bathes, warm compresses with no relief.  No drainage.

## 2018-12-01 NOTE — ED Notes (Signed)
Patient verbalizes understanding of discharge instructions. Opportunity for questioning and answers were provided. Armband removed by staff, pt discharged from ED.  

## 2018-12-01 NOTE — ED Provider Notes (Signed)
[facility] EMERGENCY DEPARTMENT Provider Note   CSN: [id number] Arrival date & time: 12/01/18  1802    History   Chief Complaint Chief Complaint  Patient presents with   Bartholin's Cyst    HPI Crystal Jenkins is a 33 y.o. female the past medical history of adenocarcinoma of the Bartholin's gland, cervical cancer, recurrent labial abscesses who presents to the emergency department with chief complaint of left labial abscess.  Patient states that has been worsening over the past 4 days.  She complains of significant pain in the left labia.  She denies any vaginal discharge, cramping.  She is noted to be febrile here in the emergency department.  She denies any urinary symptoms.     HPI  Past Medical History:  Diagnosis Date   Adenocarcinoma of Bartholin's gland, stage 1 ([place])    Cancer ([place])    cervical   Extrauterine pregnancy    Headache(784.0)    Infection    UTI   MRSA (methicillin resistant Staphylococcus aureus)    Sleeping difficulties    wakes up w/ trouble going back to sleep   Vaginal Pap smear, abnormal    2005; had cryo   Vertigo    Vertigo     Patient Active Problem List   Diagnosis Date Noted   Bartholin's gland abscess 12/16/2016   Migraine with aura 09/20/2013    Past Surgical History:  Procedure Laterality Date   CESAREAN SECTION  2007   CESAREAN SECTION N/A 07/30/2014   Procedure: CESAREAN SECTION;  Surgeon: Truett Mainland, DO;  Location: [facility];  Service: Obstetrics;  Laterality: N/A;   CESAREAN SECTION  2009   CRYOTHERAPY     DILATION AND CURETTAGE OF UTERUS     LAPAROTOMY     removal of ectopic preg     OB History    Gravida  5   Para  3   Term  3   Preterm  0   AB  2   Living  3     SAB  1   TAB      Ectopic  1   Multiple  0   Live Births  3            Home Medications    Prior to Admission medications   Medication Sig Start Date End Date Taking? Authorizing Provider   DimenhyDRINATE (DRAMAMINE PO) Take 1 tablet as needed by mouth (dizziness).    [provider]  Fremanezumab-vfrm (AJOVY) 225 MG/1.5ML SOSY Inject 1 pen every 30 (thirty) days into the skin. 07/03/17   Melvenia Beam, MD  HYDROcodone-acetaminophen (NORCO) 5-325 MG tablet Take 2 tablets by mouth every 4 (four) hours as needed. 12/01/18   Ellieana Dolecki, Vernie Shanks, PA-C  ibuprofen (ADVIL,MOTRIN) 200 MG tablet Take 200-600 mg every 8 (eight) hours as needed by mouth for headache.    [provider]  naproxen (NAPROSYN) 375 MG tablet Take 1 tablet (375 mg total) by mouth 2 (two) times daily. 09/19/18   Wurst, [place], PA-C  nitrofurantoin, macrocrystal-monohydrate, (MACROBID) 100 MG capsule Take 1 capsule (100 mg total) by mouth 2 (two) times daily. Patient not taking: Reported on 09/19/2018 07/17/18   Wende Mott, CNM  NONFORMULARY OR COMPOUNDED ITEM Inject 1 application every 30 (thirty) days into the skin. 07/03/17   Melvenia Beam, MD  norethindrone (ORTHO MICRONOR) 0.35 MG tablet Take 1 tablet (0.35 mg total) by mouth daily. 07/17/18   Wende Mott, CNM  nortriptyline (PAMELOR) 25 MG capsule Take 1 capsule (25 mg total) at bedtime by mouth. 07/03/17   Melvenia Beam, MD  ondansetron (ZOFRAN) 4 MG tablet Take 1 tablet (4 mg total) every 8 (eight) hours as needed by mouth for nausea or vomiting. 07/03/17   Melvenia Beam, MD  rizatriptan (MAXALT) 10 MG tablet Take 1 tablet (10 mg total) as needed by mouth for migraine. May repeat in 2 hours if needed 07/03/17   Melvenia Beam, MD  sulfamethoxazole-trimethoprim (BACTRIM DS) 800-160 MG tablet Take 1 tablet by mouth 2 (two) times daily for 7 days. 12/01/18 12/08/18  Margarita Mail, PA-C    Family History Family History  Problem Relation Age of Onset   Hypertension Mother    Stroke Mother    Heart disease Mother    Epilepsy Mother    HIV/AIDS Father    Heart disease Paternal Aunt    Deep vein thrombosis Paternal Aunt     Hypertension Maternal Grandmother    Diabetes Maternal Grandmother    Cancer Maternal Grandmother     Social History Social History   Tobacco Use   Smoking status: Former Smoker    Types: Cigarettes    Last attempt to quit: 11/11/2013    Years since quitting: 5.0   Smokeless tobacco: Never Used   Tobacco comment: social somoker, quit with +preg  Substance Use Topics   Alcohol use: No    Comment: very rare   Drug use: No     Allergies   Bactrim [sulfamethoxazole-trimethoprim] and Oxycodone   Review of Systems Review of Systems Ten systems reviewed and are negative for acute change, except as noted in the HPI.    Physical Exam Updated Vital Signs BP (!) 128/99 (BP Location: Right Arm)    Pulse (!) 102    Temp (!) 100.4 F (38 C) (Oral)    Resp 16    Ht 5\' 2"  (1.575 m)    Wt 65.8 kg    LMP 11/25/2018    SpO2 100%    BMI 26.52 kg/m   Physical Exam Vitals signs and nursing note reviewed. Exam conducted with a chaperone present.  Constitutional:      General: She is not in acute distress.    Appearance: She is well-developed. She is not diaphoretic.  HENT:     Head: Normocephalic and atraumatic.  Eyes:     General: No scleral icterus.    Conjunctiva/sclera: Conjunctivae normal.  Neck:     Musculoskeletal: Normal range of motion.  Cardiovascular:     Rate and Rhythm: Normal rate and regular rhythm.     Heart sounds: Normal heart sounds. No murmur. No friction rub. No gallop.   Pulmonary:     Effort: Pulmonary effort is normal. No respiratory distress.     Breath sounds: Normal breath sounds.  Abdominal:     General: Bowel sounds are normal. There is no distension.     Palpations: Abdomen is soft. There is no mass.     Tenderness: There is no abdominal tenderness. There is no guarding.  Genitourinary:   Skin:    General: Skin is warm and dry.  Neurological:     Mental Status: She is alert and oriented to person, place, and time.  Psychiatric:         Behavior: Behavior normal.      ED Treatments / Results  Labs (all labs ordered are listed, but only abnormal results are displayed) Labs Reviewed  CBC WITH  DIFFERENTIAL/PLATELET - Abnormal; Notable for the following components:      Result Value   WBC 17.8 (*)    Neutro Abs 12.7 (*)    Monocytes Absolute 1.6 (*)    All other components within normal limits  BASIC METABOLIC PANEL - Abnormal; Notable for the following components:   Glucose, Bld 109 (*)    Calcium 8.4 (*)    All other components within normal limits    EKG None  Radiology No results found.  Procedures .Marland KitchenIncision and Drainage Date/Time: 12/01/2018 10:25 PM Performed by: Margarita Mail, PA-C Authorized by: Margarita Mail, PA-C   Consent:    Consent obtained:  Verbal   Consent given by:  Patient   Risks discussed:  Bleeding, incomplete drainage, pain and infection   Alternatives discussed:  Alternative treatment, no treatment and delayed treatment Location:    Type:  Bartholin cyst   Size:  Large   Location:  Anogenital   Anogenital location:  Bartholin's gland Pre-procedure details:    Skin preparation:  Betadine Anesthesia (see MAR for exact dosages):    Anesthesia method:  Local infiltration   Local anesthetic:  Lidocaine 1% w/o epi Procedure type:    Complexity:  Complex Procedure details:    Incision types:  Single straight   Incision depth:  Dermal   Scalpel blade:  11   Wound management:  Probed and deloculated and irrigated with saline   Drainage:  Purulent   Drainage amount:  Copious   Wound treatment:  Wound left open   Packing materials:  None Post-procedure details:    Patient tolerance of procedure:  Tolerated with difficulty Comments:     I attempted marsupialization technique however was unable to achieve this.  Ultimately decided to just leave the wound open patient feels comfortable with this.  She does not want to have a Word catheter placed.   (including critical care  time)  Medications Ordered in ED Medications  lidocaine-EPINEPHrine (XYLOCAINE W/EPI) 2 %-1:100000 (with pres) injection 20 mL (20 mLs Intradermal Not Given 12/01/18 1854)  lidocaine-EPINEPHrine (XYLOCAINE W/EPI) 2 %-1:200000 (PF) injection 20 mL (has no administration in time range)  acetaminophen (TYLENOL) tablet 1,000 mg (1,000 mg Oral Given 12/01/18 2130)     Initial Impression / Assessment and Plan / ED Course  I have reviewed the triage vital signs and the nursing notes.  Pertinent labs & imaging results that were available during my care of the patient were reviewed by me and considered in my medical decision making (see chart for details).        Patient with skin abscess amenable to incision and drainage.  wound recheck in 2 days. Encouraged home warm soaks and flushing.  Patient had a white count of 17,000 and fever of 100.4.  Given this I feel that antibiotic therapy is warranted.  Patient started on Bactrim.  She does have it listed as an allergy but states that it causes constipation.  Given the fact that second line treatment is dual antibiotic therapy I felt that this would be the better option and patient agreed.  She will take MiraLAX or stool softeners as needed.  Patient is advised to follow closely at [facility] outpatient [facility].  Discussed return precautions.  She appears appropriate for discharge at this time  Final Clinical Impressions(s) / ED Diagnoses   Final diagnoses:  Bartholin's gland abscess    ED Discharge Orders         Ordered    HYDROcodone-acetaminophen ([place]) 5-325  MG tablet  Every 4 hours PRN,   Status:  Discontinued     12/01/18 2114    sulfamethoxazole-trimethoprim (BACTRIM DS) 800-160 MG tablet  2 times daily     12/01/18 2114    HYDROcodone-acetaminophen (NORCO) 5-325 MG tablet  Every 4 hours PRN     12/01/18 2116           Margarita Mail, PA-C 12/01/18 2234    Noemi Chapel, MD 12/03/18 [phone]

## 2019-03-29 ENCOUNTER — Ambulatory Visit: Payer: BC Managed Care – PPO | Admitting: Podiatry

## 2019-07-23 ENCOUNTER — Other Ambulatory Visit: Payer: Self-pay

## 2019-07-23 ENCOUNTER — Emergency Department (HOSPITAL_COMMUNITY)
Admission: EM | Admit: 2019-07-23 | Discharge: 2019-07-23 | Disposition: A | Discharge status: Home / Self Care | Payer: BC Managed Care – PPO | Attending: Emergency Medicine | Admitting: Emergency Medicine

## 2019-07-23 ENCOUNTER — Encounter (HOSPITAL_COMMUNITY): Payer: Self-pay | Admitting: Emergency Medicine

## 2019-07-23 DIAGNOSIS — N751 Abscess of Bartholin's gland: Secondary | ICD-10-CM | POA: Diagnosis not present

## 2019-07-23 DIAGNOSIS — Z8541 Personal history of malignant neoplasm of cervix uteri: Secondary | ICD-10-CM | POA: Diagnosis not present

## 2019-07-23 DIAGNOSIS — Z87891 Personal history of nicotine dependence: Secondary | ICD-10-CM | POA: Diagnosis not present

## 2019-07-23 DIAGNOSIS — N94819 Vulvodynia, unspecified: Secondary | ICD-10-CM | POA: Diagnosis present

## 2019-07-23 DIAGNOSIS — Z79899 Other long term (current) drug therapy: Secondary | ICD-10-CM | POA: Diagnosis not present

## 2019-07-23 MED ORDER — SULFAMETHOXAZOLE-TRIMETHOPRIM 800-160 MG PO TABS: 1.0000 | ORAL_TABLET | Freq: Two times a day (BID) | ORAL | 0 refills | Status: AC

## 2019-07-23 MED ORDER — IBUPROFEN 600 MG PO TABS: 600.0000 mg | ORAL_TABLET | Freq: Four times a day (QID) | ORAL | 0 refills | Status: DC | PRN

## 2019-07-23 MED ORDER — HYDROMORPHONE HCL 1 MG/ML IJ SOLN
1.0000 mg | Freq: Once | INTRAMUSCULAR | Status: AC
  Administered 2019-07-23: 1 mg via INTRAVENOUS
  Filled 2019-07-23: qty 1

## 2019-07-23 MED ORDER — KETOROLAC TROMETHAMINE 30 MG/ML IJ SOLN
30.0000 mg | Freq: Once | INTRAMUSCULAR | Status: AC
  Administered 2019-07-23: 18:00:00 30 mg via INTRAVENOUS
  Filled 2019-07-23: qty 1

## 2019-07-23 MED ORDER — LIDOCAINE-EPINEPHRINE (PF) 2 %-1:200000 IJ SOLN
10.0000 mL | Freq: Once | INTRAMUSCULAR | Status: AC
  Administered 2019-07-23: 10 mL via INTRADERMAL
  Filled 2019-07-23: qty 20

## 2019-07-23 MED ORDER — HYDROMORPHONE HCL 1 MG/ML IJ SOLN
0.5000 mg | Freq: Once | INTRAMUSCULAR | Status: AC
  Administered 2019-07-23: 0.5 mg via INTRAVENOUS
  Filled 2019-07-23: qty 1

## 2019-07-23 NOTE — ED Provider Notes (Signed)
[facility] EMERGENCY DEPARTMENT Provider Note   CSN: [id number] Arrival date & time: 07/23/19  1601     History   Chief Complaint Chief Complaint  Patient presents with  . Cyst    HPI Crystal Jenkins is a 33 y.o. female we will start with adenocarcinoma both lung gland, cervical cancer, recurrent Bartholin gland abscess presents for evaluation of acute onset, progressively worsening left labial pain and swelling 2 days.  Reports that 1 week ago she noticed a small amount of swelling to the left labia but reports that it was minimal and not painful.  Symptoms progressively worsened yesterday and today she noticed swelling and pain to the point that she can no longer ambulate without severe pain.  No drainage.  Denies vaginal itching, bleeding, or discharge.  Denies abdominal pain, nausea, vomiting, diarrhea but has not had a bowel movement in 2 days.  Also reports fevers at home, maximum temperature of 102 F improved with Tylenol.     The history is provided by the patient.    Past Medical History:  Diagnosis Date  . Adenocarcinoma of Bartholin's gland, stage 1 ([place])   . Cancer ([place])    cervical  . Extrauterine pregnancy   . Headache(784.0)   . Infection    UTI  . MRSA (methicillin resistant Staphylococcus aureus)   . Sleeping difficulties    wakes up w/ trouble going back to sleep  . Vaginal Pap smear, abnormal    2005; had cryo  . Vertigo   . Vertigo     Patient Active Problem List   Diagnosis Date Noted  . Bartholin's gland abscess 12/16/2016  . Migraine with aura 09/20/2013    Past Surgical History:  Procedure Laterality Date  . CESAREAN SECTION  2007  . CESAREAN SECTION N/A 07/30/2014   Procedure: CESAREAN SECTION;  Surgeon: Truett Mainland, DO;  Location: [facility];  Service: Obstetrics;  Laterality: N/A;  . CESAREAN SECTION  2009  . CRYOTHERAPY    . DILATION AND CURETTAGE OF UTERUS    . LAPAROTOMY     removal of ectopic preg     OB  History    Gravida  5   Para  3   Term  3   Preterm  0   AB  2   Living  3     SAB  1   TAB      Ectopic  1   Multiple  0   Live Births  3            Home Medications    Prior to Admission medications   Medication Sig Start Date End Date Taking? Authorizing Provider  DimenhyDRINATE (DRAMAMINE PO) Take 1 tablet as needed by mouth (dizziness).    [provider]  Fremanezumab-vfrm (AJOVY) 225 MG/1.5ML SOSY Inject 1 pen every 30 (thirty) days into the skin. 07/03/17   Melvenia Beam, MD  HYDROcodone-acetaminophen (NORCO) 5-325 MG tablet Take 2 tablets by mouth every 4 (four) hours as needed. 12/01/18   Harris, Vernie Shanks, PA-C  ibuprofen (ADVIL) 600 MG tablet Take 1 tablet (600 mg total) by mouth every 6 (six) hours as needed. 07/23/19   Jakerria Kingbird A, PA-C  naproxen (NAPROSYN) 375 MG tablet Take 1 tablet (375 mg total) by mouth 2 (two) times daily. 09/19/18   Wurst, [place], PA-C  nitrofurantoin, macrocrystal-monohydrate, (MACROBID) 100 MG capsule Take 1 capsule (100 mg total) by mouth 2 (two) times daily. Patient not taking: Reported  on 09/19/2018 07/17/18   Wende Mott, CNM  NONFORMULARY OR COMPOUNDED ITEM Inject 1 application every 30 (thirty) days into the skin. 07/03/17   Melvenia Beam, MD  norethindrone (ORTHO MICRONOR) 0.35 MG tablet Take 1 tablet (0.35 mg total) by mouth daily. 07/17/18   Wende Mott, CNM  nortriptyline (PAMELOR) 25 MG capsule Take 1 capsule (25 mg total) at bedtime by mouth. 07/03/17   Melvenia Beam, MD  ondansetron (ZOFRAN) 4 MG tablet Take 1 tablet (4 mg total) every 8 (eight) hours as needed by mouth for nausea or vomiting. 07/03/17   Melvenia Beam, MD  rizatriptan (MAXALT) 10 MG tablet Take 1 tablet (10 mg total) as needed by mouth for migraine. May repeat in 2 hours if needed 07/03/17   Melvenia Beam, MD  sulfamethoxazole-trimethoprim (BACTRIM DS) 800-160 MG tablet Take 1 tablet by mouth 2 (two) times daily for 7  days. 07/23/19 07/30/19  Renita Papa, PA-C    Family History Family History  Problem Relation Age of Onset  . Hypertension Mother   . Stroke Mother   . Heart disease Mother   . Epilepsy Mother   . HIV/AIDS Father   . Heart disease Paternal Aunt   . Deep vein thrombosis Paternal Aunt   . Hypertension Maternal Grandmother   . Diabetes Maternal Grandmother   . Cancer Maternal Grandmother     Social History Social History   Tobacco Use  . Smoking status: Former Smoker    Types: Cigarettes    Quit date: 11/11/2013    Years since quitting: 5.6  . Smokeless tobacco: Never Used  . Tobacco comment: social somoker, quit with +preg  Substance Use Topics  . Alcohol use: No    Comment: very rare  . Drug use: No     Allergies   Bactrim [sulfamethoxazole-trimethoprim] and Oxycodone   Review of Systems Review of Systems  Constitutional: Positive for fever.  Gastrointestinal: Positive for abdominal pain. Negative for nausea and vomiting.  Genitourinary: Positive for vaginal pain. Negative for vaginal bleeding and vaginal discharge.  Skin:       +abscess  All other systems reviewed and are negative.    Physical Exam Updated Vital Signs BP (!) 118/94 (BP Location: Right Arm)   Pulse 92   Temp 98.8 F (37.1 C) (Oral)   Resp 16   LMP 07/01/2019   SpO2 99%   Physical Exam Vitals signs and nursing note reviewed. Exam conducted with a chaperone present.  Constitutional:      General: She is not in acute distress.    Appearance: She is well-developed.     Comments: Appears uncomfortable  HENT:     Head: Normocephalic and atraumatic.  Eyes:     General:        Right eye: No discharge.        Left eye: No discharge.     Conjunctiva/sclera: Conjunctivae normal.  Neck:     Musculoskeletal: Normal range of motion and neck supple.     Vascular: No JVD.     Trachea: No tracheal deviation.  Cardiovascular:     Rate and Rhythm: Regular rhythm. Tachycardia present.   Pulmonary:     Effort: Pulmonary effort is normal.     Breath sounds: Normal breath sounds.  Abdominal:     General: There is no distension.  Genitourinary:    Labia:        Left: Tenderness present. No rash.  Comments: Examination performed in the presence of a chaperone. 5x3cm area of swelling and tendeness to left lower labia consistent with bartholin gland abscess.  Skin:    General: Skin is warm and dry.     Findings: No erythema.  Neurological:     Mental Status: She is alert.  Psychiatric:        Behavior: Behavior normal.      ED Treatments / Results  Labs (all labs ordered are listed, but only abnormal results are displayed) Labs Reviewed - No data to display  EKG None  Radiology No results found.  Procedures .Marland KitchenIncision and Drainage  Date/Time: 07/23/2019 7:35 PM Performed by: Renita Papa, PA-C Authorized by: Renita Papa, PA-C   Consent:    Consent obtained:  Verbal   Consent given by:  Patient   Risks discussed:  Bleeding, incomplete drainage, pain and damage to other organs   Alternatives discussed:  No treatment Universal protocol:    Procedure explained and questions answered to patient or proxy's satisfaction: yes     Relevant documents present and verified: yes     Test results available and properly labeled: yes     Imaging studies available: yes     Required blood products, implants, devices, and special equipment available: yes     Site/side marked: yes     Immediately prior to procedure a time out was called: yes     Patient identity confirmed:  Verbally with patient Location:    Type:  Bartholin cyst   Size:  5x3cm   Location:  Anogenital   Anogenital location:  Bartholin's gland Pre-procedure details:    Skin preparation:  Betadine Anesthesia (see MAR for exact dosages):    Anesthesia method:  Local infiltration and topical application   Topical anesthesia: gebauer's spray.   Local anesthetic:  Lidocaine 1% WITH epi and  lidocaine 2% WITH epi Procedure type:    Complexity:  Complex Procedure details:    Incision types:  Single straight   Incision depth:  Dermal   Scalpel blade:  11   Wound management:  Probed and deloculated, irrigated with saline and extensive cleaning   Drainage:  Purulent   Drainage amount:  Copious   Wound treatment:  Drain placed   Packing materials:  Word catheter Post-procedure details:    Patient tolerance of procedure:  Tolerated well, no immediate complications   (including critical care time)  Medications Ordered in ED Medications  HYDROmorphone (DILAUDID) injection 1 mg (1 mg Intravenous Given 07/23/19 1827)  ketorolac (TORADOL) 30 MG/ML injection 30 mg (30 mg Intravenous Given 07/23/19 1829)  lidocaine-EPINEPHrine (XYLOCAINE W/EPI) 2 %-1:200000 (PF) injection 10 mL (10 mLs Intradermal Given by Other 07/23/19 1915)  HYDROmorphone (DILAUDID) injection 0.5 mg (0.5 mg Intravenous Given 07/23/19 2009)     Initial Impression / Assessment and Plan / ED Course  I have reviewed the triage vital signs and the nursing notes.  Pertinent labs & imaging results that were available during my care of the patient were reviewed by me and considered in my medical decision making (see chart for details).        Patient with history of recurrent Bartholin gland abscesses presents with right flank pain abscess on the left.  She is afebrile, tachycardic on initial presentation but appears to be quite uncomfortable and physical examination is limited due to her pain.  She was given adequate pain control in the ED and I was able to examine her more closely.  Abdomen  soft and nontender.  No concern for STIs, PID, TOA, ovarian torsion, or ectopic pregnancy.  She underwent I&D of the Bartholin gland cyst with Word catheter placement.  She appears nontoxic, nonseptic.  Feels markedly better after drainage and good pain control.  We will start her on p.o. antibiotics given she was febrile at home.   Discussed close follow-up with OB/GYN for reevaluation and for catheter removal.  Discussed strict ED return precautions.  Patient and significant other verbalized understanding of and agreement with plan and patient stable for discharge home at this time.  Final Clinical Impressions(s) / ED Diagnoses   Final diagnoses:  Bartholin's gland abscess    ED Discharge Orders         Ordered    sulfamethoxazole-trimethoprim (BACTRIM DS) 800-160 MG tablet  2 times daily     07/23/19 1933    ibuprofen (ADVIL) 600 MG tablet  Every 6 hours PRN     07/23/19 1933           Debroah Baller 07/23/19 2050    Carmin Muskrat, MD 07/25/19 [phone]

## 2019-07-23 NOTE — Discharge Instructions (Addendum)
Keep wound clean and dry. Apply warm compresses throughout the day. Alternate 600 mg of ibuprofen and [phone] mg of Tylenol every 3-6 hours as needed for pain. Do not exceed 4000 mg of Tylenol daily.  Take ibuprofen with food to avoid upset stomach issues.  Please take all of your antibiotics until finished!   Take your antibiotics with food.  Common side effects of antibiotics include nausea, vomiting, abdominal discomfort, and diarrhea. You may help offset some of this with probiotics which you can buy or get in yogurt. Do not eat  or take the probiotics until 2 hours after your antibiotic.    Some studies suggest that certain antibiotics can reduce the efficacy of certain oral contraceptive pills (birth control), so please use additional contraceptives (condoms or other barrier method) while you are taking the antibiotics and for an additional 5 to 7 days afterwards if you are a female on these medications.  Call OB/GYN tomorrow to schedule follow-up appointment for reevaluation and removal of the right catheter.  Return to the emergency department immediately if any concerning signs or symptoms develop such as high fevers, persistent vomiting, worsening pain.

## 2019-07-23 NOTE — ED Notes (Signed)
Patient verbalizes understanding of discharge instructions. Opportunity for questioning and answers were provided. Armband removed by staff, pt discharged from ED.  

## 2019-07-23 NOTE — ED Triage Notes (Signed)
Pt states she is here for recurrent bartholin cysts. Pt has had it lanced multiple times over 5 years.

## 2021-06-23 ENCOUNTER — Encounter (HOSPITAL_COMMUNITY): Payer: Self-pay | Admitting: Emergency Medicine

## 2021-06-23 ENCOUNTER — Emergency Department (HOSPITAL_COMMUNITY)
Admission: EM | Admit: 2021-06-23 | Discharge: 2021-06-23 | Disposition: A | Discharge status: Home / Self Care | Payer: BC Managed Care – PPO | Attending: Emergency Medicine | Admitting: Emergency Medicine

## 2021-06-23 ENCOUNTER — Other Ambulatory Visit: Payer: Self-pay

## 2021-06-23 DIAGNOSIS — S3992XA Unspecified injury of lower back, initial encounter: Secondary | ICD-10-CM | POA: Diagnosis present

## 2021-06-23 DIAGNOSIS — Z20822 Contact with and (suspected) exposure to covid-19: Secondary | ICD-10-CM | POA: Insufficient documentation

## 2021-06-23 DIAGNOSIS — X500XXA Overexertion from strenuous movement or load, initial encounter: Secondary | ICD-10-CM | POA: Insufficient documentation

## 2021-06-23 DIAGNOSIS — Z8541 Personal history of malignant neoplasm of cervix uteri: Secondary | ICD-10-CM | POA: Diagnosis not present

## 2021-06-23 DIAGNOSIS — R509 Fever, unspecified: Secondary | ICD-10-CM | POA: Insufficient documentation

## 2021-06-23 DIAGNOSIS — Z87891 Personal history of nicotine dependence: Secondary | ICD-10-CM | POA: Insufficient documentation

## 2021-06-23 DIAGNOSIS — R300 Dysuria: Secondary | ICD-10-CM | POA: Diagnosis not present

## 2021-06-23 DIAGNOSIS — S39012A Strain of muscle, fascia and tendon of lower back, initial encounter: Secondary | ICD-10-CM | POA: Insufficient documentation

## 2021-06-23 LAB — URINALYSIS, ROUTINE W REFLEX MICROSCOPIC
Bilirubin Urine: NEGATIVE
Glucose, UA: NEGATIVE mg/dL
Ketones, ur: NEGATIVE mg/dL
Leukocytes,Ua: NEGATIVE
Nitrite: NEGATIVE
Protein, ur: 30 mg/dL — AB
RBC / HPF: >50 RBC/hpf — ABNORMAL HIGH (ref 0–5)
Specific Gravity, Urine: 1.026 (ref 1.005–1.030)
pH: 6 (ref 5.0–8.0)

## 2021-06-23 LAB — RESP PANEL BY RT-PCR (FLU A&B, COVID) ARPGX2
Influenza A by PCR: NEGATIVE
Influenza B by PCR: NEGATIVE
SARS Coronavirus 2 by RT PCR: NEGATIVE

## 2021-06-23 MED ORDER — METHOCARBAMOL 500 MG PO TABS: 1000.0000 mg | ORAL_TABLET | Freq: Four times a day (QID) | ORAL | 0 refills | Status: DC | PRN

## 2021-06-23 MED ORDER — KETOROLAC TROMETHAMINE 30 MG/ML IJ SOLN
30.0000 mg | Freq: Once | INTRAMUSCULAR | Status: AC
  Administered 2021-06-23: 30 mg via INTRAMUSCULAR
  Filled 2021-06-23: qty 1

## 2021-06-23 MED ORDER — MELOXICAM 7.5 MG PO TABS: 7.5000 mg | ORAL_TABLET | Freq: Every day | ORAL | 0 refills | Status: DC

## 2021-06-23 NOTE — Discharge Instructions (Addendum)
Please read and follow all provided instructions.  Your diagnoses today include:  1. Strain of lumbar region, initial encounter     Tests performed today include: Vital signs - see below for your results today Flu test: was negative  Medications prescribed:  Meloxicam - anti-inflammatory pain medication  You have been prescribed an anti-inflammatory medication or NSAID. Take with food. Do not take aspirin, ibuprofen, or naproxen if taking this medication. Take smallest effective dose for the shortest duration needed for your pain. Stop taking if you experience stomach pain or vomiting.   Robaxin (methocarbamol) - muscle relaxer medication  DO NOT drive or perform any activities that require you to be awake and alert because this medicine can make you drowsy.   Take any prescribed medications only as directed.  Home care instructions:  Follow any educational materials contained in this packet Please rest, use ice or heat on your back for the next several days Do not lift, push, pull anything more than 10 pounds for the next week  Follow-up instructions: Please follow-up with your primary care provider in the next 1 week for further evaluation of your symptoms.   Return instructions:  SEEK IMMEDIATE MEDICAL ATTENTION IF YOU HAVE: New numbness, tingling, weakness, or problem with the use of your arms or legs Severe back pain not relieved with medications Loss control of your bowels or bladder Increasing pain in any areas of the body (such as chest or abdominal pain) Shortness of breath, dizziness, or fainting.  Worsening nausea (feeling sick to your stomach), vomiting, fever, or sweats Any other emergent concerns regarding your health   Additional Information:  Your vital signs today were: BP 106/79 (BP Location: Right Arm)   Pulse 80   Temp 99 F (37.2 C) (Oral)   Resp 16   SpO2 100%  If your blood pressure (BP) was elevated above 135/85 this visit, please have this  repeated by your doctor within one month. --------------

## 2021-06-23 NOTE — ED Provider Notes (Addendum)
[facility] EMERGENCY DEPARTMENT Provider Note   CSN: [id number] Arrival date & time: 06/23/21  1400     History Chief Complaint  Patient presents with   Back Pain    Crystal Jenkins is a 35 y.o. female.  Patient with history of UTI presents the emergency department for evaluation of back pain.  She was playing with her son 5 days ago and bent over and all of a sudden had a sharp pain in her lower back.  Since that time she has had pain that is worse with movement, standing, walking.  She does find some relief with lying in certain positions.  She also reports constipation that started prior to her back injury.  In addition, she reports fevers to 101 F intermittently over the past several days.  She has had URI symptoms and a cough during this time.  She has also had dysuria but no urinary incontinence or retention.  She is uncertain if she has a UTI.  No chest pain, vomiting.  No other treatments other than OTC meds at home.  She cannot get through the workday due to her back pain.      Past Medical History:  Diagnosis Date   Adenocarcinoma of Bartholin's gland, stage 1 ([place])    Cancer ([place])    cervical   Extrauterine pregnancy    Headache(784.0)    Infection    UTI   MRSA (methicillin resistant Staphylococcus aureus)    Sleeping difficulties    wakes up w/ trouble going back to sleep   Vaginal Pap smear, abnormal    2005; had cryo   Vertigo    Vertigo     Patient Active Problem List   Diagnosis Date Noted   Bartholin's gland abscess 12/16/2016   Migraine with aura 09/20/2013    Past Surgical History:  Procedure Laterality Date   CESAREAN SECTION  2007   CESAREAN SECTION N/A 07/30/2014   Procedure: CESAREAN SECTION;  Surgeon: Truett Mainland, DO;  Location: [facility];  Service: Obstetrics;  Laterality: N/A;   CESAREAN SECTION  2009   CRYOTHERAPY     DILATION AND CURETTAGE OF UTERUS     LAPAROTOMY     removal of ectopic preg     OB History      Gravida  5   Para  3   Term  3   Preterm  0   AB  2   Living  3      SAB  1   IAB      Ectopic  1   Multiple  0   Live Births  3           Family History  Problem Relation Age of Onset   Hypertension Mother    Stroke Mother    Heart disease Mother    Epilepsy Mother    HIV/AIDS Father    Heart disease Paternal Aunt    Deep vein thrombosis Paternal Aunt    Hypertension Maternal Grandmother    Diabetes Maternal Grandmother    Cancer Maternal Grandmother     Social History   Tobacco Use   Smoking status: Former    Types: Cigarettes    Quit date: 11/11/2013    Years since quitting: 7.6   Smokeless tobacco: Never   Tobacco comments:    social somoker, quit with +preg  Vaping Use   Vaping Use: Never used  Substance Use Topics   Alcohol use: No  Comment: very rare   Drug use: No    Home Medications Prior to Admission medications   Medication Sig Start Date End Date Taking? Authorizing Provider  DimenhyDRINATE (DRAMAMINE PO) Take 1 tablet as needed by mouth (dizziness).    [provider]  Fremanezumab-vfrm (AJOVY) 225 MG/1.5ML SOSY Inject 1 pen every 30 (thirty) days into the skin. 07/03/17   Melvenia Beam, MD  HYDROcodone-acetaminophen (NORCO) 5-325 MG tablet Take 2 tablets by mouth every 4 (four) hours as needed. 12/01/18   Harris, Vernie Shanks, PA-C  ibuprofen (ADVIL) 600 MG tablet Take 1 tablet (600 mg total) by mouth every 6 (six) hours as needed. 07/23/19   Fawze, Mina A, PA-C  naproxen (NAPROSYN) 375 MG tablet Take 1 tablet (375 mg total) by mouth 2 (two) times daily. 09/19/18   Wurst, [place], PA-C  nitrofurantoin, macrocrystal-monohydrate, (MACROBID) 100 MG capsule Take 1 capsule (100 mg total) by mouth 2 (two) times daily. Patient not taking: Reported on 09/19/2018 07/17/18   Wende Mott, CNM  NONFORMULARY OR COMPOUNDED ITEM Inject 1 application every 30 (thirty) days into the skin. 07/03/17   Melvenia Beam, MD  norethindrone  (ORTHO MICRONOR) 0.35 MG tablet Take 1 tablet (0.35 mg total) by mouth daily. 07/17/18   Wende Mott, CNM  nortriptyline (PAMELOR) 25 MG capsule Take 1 capsule (25 mg total) at bedtime by mouth. 07/03/17   Melvenia Beam, MD  ondansetron (ZOFRAN) 4 MG tablet Take 1 tablet (4 mg total) every 8 (eight) hours as needed by mouth for nausea or vomiting. 07/03/17   Melvenia Beam, MD  rizatriptan (MAXALT) 10 MG tablet Take 1 tablet (10 mg total) as needed by mouth for migraine. May repeat in 2 hours if needed 07/03/17   Melvenia Beam, MD    Allergies    Bactrim [sulfamethoxazole-trimethoprim] and Oxycodone  Review of Systems   Review of Systems  Constitutional:  Positive for chills and fever. Negative for unexpected weight change.  HENT:  Positive for congestion.   Respiratory:  Positive for cough.   Gastrointestinal:  Positive for constipation. Negative for abdominal pain, nausea and vomiting.       Negative for fecal incontinence.   Genitourinary:  Positive for dysuria. Negative for flank pain, hematuria, pelvic pain, vaginal bleeding and vaginal discharge.       Negative for urinary incontinence or retention.  Musculoskeletal:  Positive for back pain and myalgias. Negative for neck pain.  Neurological:  Negative for weakness and numbness.       Denies saddle paresthesias.   Physical Exam Updated Vital Signs BP 106/79 (BP Location: Right Arm)   Pulse 80   Temp 99 F (37.2 C) (Oral)   Resp 16   SpO2 100%   Physical Exam Vitals and nursing note reviewed.  Constitutional:      Appearance: She is well-developed.  HENT:     Head: Normocephalic and atraumatic.     Right Ear: External ear normal.     Left Ear: External ear normal.     Nose: No congestion or rhinorrhea.     Mouth/Throat:     Mouth: Mucous membranes are moist.  Eyes:     Conjunctiva/sclera: Conjunctivae normal.  Cardiovascular:     Rate and Rhythm: Normal rate and regular rhythm.     Pulses: Normal  pulses.  Pulmonary:     Effort: Pulmonary effort is normal.  Abdominal:     Palpations: Abdomen is soft.     Tenderness: There  is no abdominal tenderness. There is no guarding or rebound.  Musculoskeletal:     Cervical back: Normal range of motion and neck supple. No tenderness. Normal range of motion.     Thoracic back: No tenderness. Normal range of motion.     Lumbar back: Tenderness present. Decreased range of motion.     Comments: No step-off noted with palpation of spine.   Skin:    General: Skin is warm and dry.     Findings: No rash.  Neurological:     Mental Status: She is alert.     Sensory: No sensory deficit.     Deep Tendon Reflexes: Reflexes are normal and symmetric.     Comments: 5/5 strength in entire lower extremities bilaterally. No sensation deficit.     ED Results / Procedures / Treatments   Labs (all labs ordered are listed, but only abnormal results are displayed) Labs Reviewed  RESP PANEL BY RT-PCR (FLU A&B, COVID) ARPGX2  URINALYSIS, ROUTINE W REFLEX MICROSCOPIC    EKG None  Radiology No results found.  Procedures Procedures   Medications Ordered in ED Medications  ketorolac (TORADOL) 30 MG/ML injection 30 mg (has no administration in time range)    ED Course  I have reviewed the triage vital signs and the nursing notes.  Pertinent labs & imaging results that were available during my care of the patient were reviewed by me and considered in my medical decision making (see chart for details).  Patient seen and examined. Work-up initiated. Medications ordered.  Will check UA and flu test given additional symptoms.  Vital signs reviewed and are as follows: BP 106/79 (BP Location: Right Arm)   Pulse 80   Temp 99 F (37.2 C) (Oral)   Resp 16   SpO2 100%   7:34 PM patient resting comfortably in the hallway.  Her exam is unchanged.  She appears comfortable.  There has been a delay in having the urine resolved.  I did check with lab to make  sure this is is running.  We will follow-up on results and contact patient if she has signs of urine infection.  Patient's RN is currently in with a code stroke and I discharged patient personally.  I reviewed medications, return instructions with patient personally.  She ambulated out of the department without any difficulty.    MDM Rules/Calculators/A&P                           Patient with back pain. MSK in nature. No neurological deficits. Patient is ambulatory. No warning symptoms of back pain including: fecal incontinence, urinary retention or overflow incontinence, night sweats, waking from sleep with back pain, unexplained fevers or weight loss, h/o cancer, IVDU, recent trauma. No concern for cauda equina, epidural abscess, or other serious cause of back pain. Conservative measures such as rest, ice/heat and pain medicine indicated with PCP follow-up if no improvement with conservative management.   Patient with fever, dysuria over the past several days.  Clinical picture is not consistent with pyelonephritis.  Flu testing negative.  UA pending.  Patient appears well, nontoxic.  Do not feel that further work-up with imaging, labs indicated at this point.  7:47 PM UA with blood, no definite infection.     Final Clinical Impression(s) / ED Diagnoses Final diagnoses:  Strain of lumbar region, initial encounter    Rx / DC Orders ED Discharge Orders  Ordered    meloxicam (MOBIC) 7.5 MG tablet  Daily        06/23/21 1917    methocarbamol (ROBAXIN) 500 MG tablet  Every 6 hours PRN        06/23/21 1917             Carlisle Cater, PA-C 06/23/21 1935    Carlisle Cater, PA-C 06/23/21 1947    Godfrey Pick, MD 06/24/21 0100

## 2021-06-23 NOTE — ED Triage Notes (Signed)
Patient complains middle lower back pain that started Friday morning after bending down help get her son out of bed. Patient also reports constipation since last Thursday. Patient alert, oriented, and in no apparent distress at this time. Patient is alert, oriented, ambulatory, and in no apparent distress at this time.

## 2022-05-11 ENCOUNTER — Other Ambulatory Visit: Payer: Self-pay

## 2022-05-11 ENCOUNTER — Emergency Department (HOSPITAL_COMMUNITY)
Admission: EM | Admit: 2022-05-11 | Discharge: 2022-05-11 | Disposition: A | Discharge status: Home / Self Care | Payer: BC Managed Care – PPO | Attending: Emergency Medicine | Admitting: Emergency Medicine

## 2022-05-11 DIAGNOSIS — Z79899 Other long term (current) drug therapy: Secondary | ICD-10-CM | POA: Diagnosis not present

## 2022-05-11 DIAGNOSIS — O219 Vomiting of pregnancy, unspecified: Secondary | ICD-10-CM | POA: Diagnosis not present

## 2022-05-11 DIAGNOSIS — N3000 Acute cystitis without hematuria: Secondary | ICD-10-CM | POA: Diagnosis not present

## 2022-05-11 DIAGNOSIS — Z3A01 Less than 8 weeks gestation of pregnancy: Secondary | ICD-10-CM | POA: Insufficient documentation

## 2022-05-11 LAB — CBC WITH DIFFERENTIAL/PLATELET
Abs Immature Granulocytes: 0.1 10*3/uL — ABNORMAL HIGH (ref 0.00–0.07)
Basophils Absolute: 0.1 10*3/uL (ref 0.0–0.1)
Basophils Relative: 1 %
Eosinophils Absolute: 0.2 10*3/uL (ref 0.0–0.5)
Eosinophils Relative: 2 %
HCT: 38.7 % (ref 36.0–46.0)
Hemoglobin: 12.5 g/dL (ref 12.0–15.0)
Immature Granulocytes: 1 %
Lymphocytes Relative: 39 %
Lymphs Abs: 3.9 10*3/uL (ref 0.7–4.0)
MCH: 29.3 pg (ref 26.0–34.0)
MCHC: 32.3 g/dL (ref 30.0–36.0)
MCV: 90.6 fL (ref 80.0–100.0)
Monocytes Absolute: 1.1 10*3/uL — ABNORMAL HIGH (ref 0.1–1.0)
Monocytes Relative: 11 %
Neutro Abs: 4.8 10*3/uL (ref 1.7–7.7)
Neutrophils Relative %: 46 %
Platelets: 465 10*3/uL — ABNORMAL HIGH (ref 150–400)
RBC: 4.27 MIL/uL (ref 3.87–5.11)
RDW: 15.7 % — ABNORMAL HIGH (ref 11.5–15.5)
WBC: 10.1 10*3/uL (ref 4.0–10.5)
nRBC: 0 % (ref 0.0–0.2)

## 2022-05-11 LAB — URINALYSIS, ROUTINE W REFLEX MICROSCOPIC
Bacteria, UA: NONE SEEN
Bilirubin Urine: NEGATIVE
Glucose, UA: NEGATIVE mg/dL
Ketones, ur: NEGATIVE mg/dL
Nitrite: POSITIVE — AB
Protein, ur: NEGATIVE mg/dL
Specific Gravity, Urine: 1.025 (ref 1.005–1.030)
pH: 6 (ref 5.0–8.0)

## 2022-05-11 LAB — LIPASE, BLOOD: Lipase: 33 U/L (ref 11–51)

## 2022-05-11 LAB — COMPREHENSIVE METABOLIC PANEL
ALT: 13 U/L (ref 0–44)
AST: 15 U/L (ref 15–41)
Albumin: 3.9 g/dL (ref 3.5–5.0)
Alkaline Phosphatase: 61 U/L (ref 38–126)
Anion gap: 5 (ref 5–15)
BUN: 11 mg/dL (ref 6–20)
CO2: 25 mmol/L (ref 22–32)
Calcium: 8.7 mg/dL — ABNORMAL LOW (ref 8.9–10.3)
Chloride: 108 mmol/L (ref 98–111)
Creatinine, Ser: 0.53 mg/dL (ref 0.44–1.00)
GFR, Estimated: >60 mL/min (ref >60)
Glucose, Bld: 105 mg/dL — ABNORMAL HIGH (ref 70–99)
Potassium: 3.8 mmol/L (ref 3.5–5.1)
Sodium: 138 mmol/L (ref 135–145)
Total Bilirubin: 0.6 mg/dL (ref 0.3–1.2)
Total Protein: 7.3 g/dL (ref 6.5–8.1)

## 2022-05-11 LAB — HCG, QUANTITATIVE, PREGNANCY: hCG, Beta Chain, Quant, S: 8535 m[IU]/mL — ABNORMAL HIGH (ref <5)

## 2022-05-11 LAB — I-STAT BETA HCG BLOOD, ED (MC, WL, AP ONLY): I-stat hCG, quantitative: >2000 m[IU]/mL — ABNORMAL HIGH (ref <5)

## 2022-05-11 MED ORDER — ACETAMINOPHEN 500 MG PO TABS
1000.0000 mg | ORAL_TABLET | Freq: Four times a day (QID) | ORAL | Status: DC | PRN
  Administered 2022-05-11: 1000 mg via ORAL
  Filled 2022-05-11: qty 2

## 2022-05-11 MED ORDER — ONDANSETRON 8 MG PO TBDP
8.0000 mg | ORAL_TABLET | Freq: Once | ORAL | Status: AC
  Administered 2022-05-11: 8 mg via ORAL
  Filled 2022-05-11: qty 1

## 2022-05-11 MED ORDER — SODIUM CHLORIDE 0.9 % IV SOLN
1.0000 g | Freq: Once | INTRAVENOUS | Status: AC
  Administered 2022-05-11: 1 g via INTRAVENOUS
  Filled 2022-05-11: qty 10

## 2022-05-11 MED ORDER — SODIUM CHLORIDE 0.9 % IV BOLUS
1000.0000 mL | Freq: Once | INTRAVENOUS | Status: AC
  Administered 2022-05-11: 1000 mL via INTRAVENOUS

## 2022-05-11 MED ORDER — ONDANSETRON 4 MG PO TBDP: 4.0000 mg | ORAL_TABLET | Freq: Three times a day (TID) | ORAL | 0 refills | Status: AC | PRN

## 2022-05-11 MED ORDER — CEPHALEXIN 500 MG PO CAPS: 500.0000 mg | ORAL_CAPSULE | Freq: Two times a day (BID) | ORAL | 0 refills | Status: AC

## 2022-05-11 NOTE — ED Provider Triage Note (Signed)
Emergency Medicine Provider Triage Evaluation Note  Crystal Jenkins , a 36 y.o. female  was evaluated in triage.  Pt complains of abdominal pain. Started a few days ago. Pain is epigastric. Non radiating. Associated nausea and vomiting for last few days. Diarrhea for two weeks. No chest pain or SOB. Did have fever last night, 102.1. Took tylenol and motrin. Denies heavy alcohol consumption. Had 3 c sections but no other abdominal surgeries. No bleeding.  Review of Systems  Positive: See above Negative: See above  Physical Exam  BP (!) 134/107 (BP Location: Left Arm)   Pulse 88   Temp 98.5 F (36.9 C) (Oral)   Resp 16   Wt 63.5 kg   SpO2 99%   BMI 25.61 kg/m  Gen:   Awake, ill appearing Resp:  Normal effort  MSK:   Moves extremities without difficulty  Other:  Epigastric tenderness  Medical Decision Making  Medically screening exam initiated at 1:30 PM.  Appropriate orders placed.  Crystal Jenkins was informed that the remainder of the evaluation will be completed by another provider, this initial triage assessment does not replace that evaluation, and the importance of remaining in the ED until their evaluation is complete.  Abdominal labs, zofran, tylenol   Harriet Pho, PA-C 05/11/22 1338

## 2022-05-11 NOTE — ED Triage Notes (Signed)
Intermittent Abd pain with N/V/D x1 week.  Tylenol and ibuprofen with no relief.

## 2022-05-11 NOTE — ED Provider Notes (Signed)
[facility] DEPT Provider Note   CSN: [id number] Arrival date & time: 05/11/22  1244     History  Chief Complaint  Patient presents with   Diarrhea   Emesis    Crystal Jenkins is a 36 y.o. female.  HPI Patient presents with concern of nausea, vomiting, diarrhea, generalized discomfort.  Last menstrual period was 6 weeks ago, but she notes that she has irregular cycles.  Patient is G3, P3, last child 7 years ago.  No clear precipitant, since onset symptoms been persistent, no relief with anything.  She had subjective fevers well yesterday.  No dysuria.  She does have occasional abdominal pain only in the epigastrium, she denies any pain in the lower abdomen.    Home Medications Prior to Admission medications   Medication Sig Start Date End Date Taking? Authorizing Provider  cephALEXin (KEFLEX) 500 MG capsule Take 1 capsule (500 mg total) by mouth 2 (two) times daily for 5 days. 05/11/22 05/16/22 Yes Carmin Muskrat, MD  ondansetron (ZOFRAN-ODT) 4 MG disintegrating tablet Take 1 tablet (4 mg total) by mouth every 8 (eight) hours as needed for nausea or vomiting. 05/11/22  Yes Carmin Muskrat, MD  DimenhyDRINATE (DRAMAMINE PO) Take 1 tablet as needed by mouth (dizziness).    [provider]  Fremanezumab-vfrm (AJOVY) 225 MG/1.5ML SOSY Inject 1 pen every 30 (thirty) days into the skin. 07/03/17   Melvenia Beam, MD  HYDROcodone-acetaminophen (NORCO) 5-325 MG tablet Take 2 tablets by mouth every 4 (four) hours as needed. 12/01/18   Margarita Mail, PA-C  meloxicam (MOBIC) 7.5 MG tablet Take 1 tablet (7.5 mg total) by mouth daily. 06/23/21   Carlisle Cater, PA-C  methocarbamol (ROBAXIN) 500 MG tablet Take 2 tablets (1,000 mg total) by mouth every 6 (six) hours as needed for muscle spasms. 06/23/21   Carlisle Cater, PA-C  nitrofurantoin, macrocrystal-monohydrate, (MACROBID) 100 MG capsule Take 1 capsule (100 mg total) by mouth 2 (two) times  daily. Patient not taking: Reported on 09/19/2018 07/17/18   Wende Mott, CNM  NONFORMULARY OR COMPOUNDED ITEM Inject 1 application every 30 (thirty) days into the skin. 07/03/17   Melvenia Beam, MD  norethindrone (ORTHO MICRONOR) 0.35 MG tablet Take 1 tablet (0.35 mg total) by mouth daily. 07/17/18   Wende Mott, CNM  nortriptyline (PAMELOR) 25 MG capsule Take 1 capsule (25 mg total) at bedtime by mouth. 07/03/17   Melvenia Beam, MD  ondansetron (ZOFRAN) 4 MG tablet Take 1 tablet (4 mg total) every 8 (eight) hours as needed by mouth for nausea or vomiting. 07/03/17   Melvenia Beam, MD  rizatriptan (MAXALT) 10 MG tablet Take 1 tablet (10 mg total) as needed by mouth for migraine. May repeat in 2 hours if needed 07/03/17   Melvenia Beam, MD      Allergies    Oxycodone and Sulfamethoxazole-trimethoprim    Review of Systems   Review of Systems  All other systems reviewed and are negative.   Physical Exam Updated Vital Signs BP 115/86 (BP Location: Left Arm)   Pulse 62   Temp 98.4 F (36.9 C) (Oral)   Resp 16   Wt 63.5 kg   SpO2 97%   BMI 25.61 kg/m  Physical Exam Vitals and nursing note reviewed.  Constitutional:      General: She is not in acute distress.    Appearance: She is well-developed.  HENT:     Head: Normocephalic and atraumatic.  Eyes:  Conjunctiva/sclera: Conjunctivae normal.  Cardiovascular:     Rate and Rhythm: Normal rate and regular rhythm.  Pulmonary:     Effort: Pulmonary effort is normal. No respiratory distress.     Breath sounds: Normal breath sounds. No stridor.  Abdominal:     General: There is no distension.     Tenderness: There is no abdominal tenderness. There is no guarding.     Comments: Though the patient previously described epigastric pain, she has none currently, no guarding, and no pain at all in the lower abdomen.  Skin:    General: Skin is warm and dry.  Neurological:     Mental Status: She is alert and oriented  to person, place, and time.     Cranial Nerves: No cranial nerve deficit.  Psychiatric:        Mood and Affect: Mood normal.     ED Results / Procedures / Treatments   Labs (all labs ordered are listed, but only abnormal results are displayed) Labs Reviewed  CBC WITH DIFFERENTIAL/PLATELET - Abnormal; Notable for the following components:      Result Value   RDW 15.7 (*)    Platelets 465 (*)    Monocytes Absolute 1.1 (*)    Abs Immature Granulocytes 0.10 (*)    All other components within normal limits  COMPREHENSIVE METABOLIC PANEL - Abnormal; Notable for the following components:   Glucose, Bld 105 (*)    Calcium 8.7 (*)    All other components within normal limits  URINALYSIS, ROUTINE W REFLEX MICROSCOPIC - Abnormal; Notable for the following components:   Hgb urine dipstick SMALL (*)    Nitrite POSITIVE (*)    Leukocytes,Ua TRACE (*)    All other components within normal limits  HCG, QUANTITATIVE, PREGNANCY - Abnormal; Notable for the following components:   hCG, Beta Chain, Quant, S 8,535 (*)    All other components within normal limits  I-STAT BETA HCG BLOOD, ED ([facility], [facility], [facility] ONLY) - Abnormal; Notable for the following components:   I-stat hCG, quantitative >2,000.0 (*)    All other components within normal limits  LIPASE, BLOOD    EKG EKG Interpretation  Date/Time:  Wednesday May 11 2022 18:54:49 EDT Ventricular Rate:  67 PR Interval:  133 QRS Duration: 79 QT Interval:  390 QTC Calculation: 412 R Axis:   28 Text Interpretation: Sinus rhythm Consider left ventricular hypertrophy unremarkable ecg Confirmed by Carmin Muskrat [phone]) on 05/11/2022 6:56:49 PM  Radiology No results found.  Procedures Procedures    Medications Ordered in ED Medications  acetaminophen (TYLENOL) tablet 1,000 mg (1,000 mg Oral Given 05/11/22 1855)  ondansetron (ZOFRAN-ODT) disintegrating tablet 8 mg (8 mg Oral Given 05/11/22 1856)  sodium chloride 0.9 % bolus 1,000 mL (0 mLs  Intravenous Stopped 05/11/22 2059)  cefTRIAXone (ROCEPHIN) 1 g in sodium chloride 0.9 % 100 mL IVPB (0 g Intravenous Stopped 05/11/22 2059)    ED Course/ Medical Decision Making/ A&P This patient with a Hx of 3 prior pregnancies presents to the ED for concern of abdominal pain, nausea, vomiting, diarrhea, subjective fever, this involves an extensive number of treatment options, and is a complaint that carries with it a high risk of complications and morbidity.    The differential diagnosis includes infection, dehydration, pregnancy   Social Determinants of Health:  No limiting factors  Additional history obtained:  Additional history and/or information obtained from chart review, including recent strain of lumbar region, and prior partial Cyst   After the initial  evaluation, orders, including: Labs were initiated.   Patient placed on Cardiac and Pulse-Oximetry Monitors. The patient was maintained on a cardiac monitor.  The cardiac monitored showed an rhythm of 75 sinus normal The patient was also maintained on pulse oximetry. The readings were typically 100% room air normal   On repeat evaluation of the patient stayed the same  Lab Tests:  I personally interpreted labs.  The pertinent results include: Pregnancy, urinary tract infection    Dispostion / Final MDM:  After consideration of the diagnostic results and the patient's response to treatment, this patient is appropriate for discharge.  In essence this is a 36 year old female G3, P3, presents with nausea, vomiting, is found to have evidence for urinary tract infection, dehydration, and initial point-of-care pregnancy test is positive.  Subsequent serum hCG was also positive, consistent with early pregnancy.  Patient has no lower abdominal pain, no guarding, and without this, low suspicion for ectopic.  Patient is afebrile, awake, alert, hemodynamically unremarkable, improved with fluids, antiemetics and antibiotics.  Patient  accompanied by family members, who after obtaining consent are aware of all findings as well.  Patient comfortable with following up with outpatient OB absent evidence for bacteremia, sepsis, decompensated state.  Final Clinical Impression(s) / ED Diagnoses Final diagnoses:  Nausea and vomiting in pregnancy  Acute cystitis without hematuria    Rx / DC Orders ED Discharge Orders          Ordered    cephALEXin (KEFLEX) 500 MG capsule  2 times daily        05/11/22 2245    ondansetron (ZOFRAN-ODT) 4 MG disintegrating tablet  Every 8 hours PRN        05/11/22 2245              Carmin Muskrat, MD 05/11/22 2251

## 2022-05-11 NOTE — Discharge Instructions (Signed)
As discussed, your evaluation today has been largely reassuring.  But, it is important that you monitor your condition carefully, and do not hesitate to return to the ED if you develop new, or concerning changes in your condition.

## 2022-05-25 ENCOUNTER — Inpatient Hospital Stay (HOSPITAL_COMMUNITY): Payer: BC Managed Care – PPO

## 2022-05-25 ENCOUNTER — Inpatient Hospital Stay (HOSPITAL_COMMUNITY)
Admission: AD | Admit: 2022-05-25 | Discharge: 2022-05-25 | Disposition: A | Discharge status: Home / Self Care | Payer: BC Managed Care – PPO | Attending: Obstetrics and Gynecology | Admitting: Obstetrics and Gynecology

## 2022-05-25 ENCOUNTER — Encounter (HOSPITAL_COMMUNITY): Payer: Self-pay | Admitting: Obstetrics and Gynecology

## 2022-05-25 DIAGNOSIS — O3680X0 Pregnancy with inconclusive fetal viability, not applicable or unspecified: Secondary | ICD-10-CM | POA: Diagnosis present

## 2022-05-25 DIAGNOSIS — O09521 Supervision of elderly multigravida, first trimester: Secondary | ICD-10-CM | POA: Diagnosis not present

## 2022-05-25 DIAGNOSIS — Z3A01 Less than 8 weeks gestation of pregnancy: Secondary | ICD-10-CM

## 2022-05-25 DIAGNOSIS — O209 Hemorrhage in early pregnancy, unspecified: Secondary | ICD-10-CM

## 2022-05-25 LAB — CBC
HCT: 35.9 % — ABNORMAL LOW (ref 36.0–46.0)
Hemoglobin: 11.8 g/dL — ABNORMAL LOW (ref 12.0–15.0)
MCH: 29.5 pg (ref 26.0–34.0)
MCHC: 32.9 g/dL (ref 30.0–36.0)
MCV: 89.8 fL (ref 80.0–100.0)
Platelets: 416 10*3/uL — ABNORMAL HIGH (ref 150–400)
RBC: 4 MIL/uL (ref 3.87–5.11)
RDW: 15.6 % — ABNORMAL HIGH (ref 11.5–15.5)
WBC: 8.4 10*3/uL (ref 4.0–10.5)
nRBC: 0 % (ref 0.0–0.2)

## 2022-05-25 LAB — HCG, QUANTITATIVE, PREGNANCY: hCG, Beta Chain, Quant, S: 17425 m[IU]/mL — ABNORMAL HIGH (ref <5)

## 2022-05-25 LAB — WET PREP, GENITAL
Sperm: NONE SEEN
Trich, Wet Prep: NONE SEEN
WBC, Wet Prep HPF POC: <10 (ref <10)
Yeast Wet Prep HPF POC: NONE SEEN

## 2022-05-25 NOTE — MAU Note (Signed)
.  Crystal Jenkins is a 36 y.o. at Unknown here in MAU reporting: VB and lower ABD cramping since yesterday that started with light pink bleeding once, and today red blood with wiping. Pt denies LOF and abnormal discharge. Pt report last intercourse was night before VB started.  LMP: 03/29/2022 Onset of complaint: yesterday  Pain score: 4/10 Vitals:   05/25/22 2118  BP: 134/77  Pulse: 79  Resp: 16  Temp: 98.4 F (36.9 C)  SpO2: 99%      Lab orders placed from triage:

## 2022-05-26 LAB — GC/CHLAMYDIA PROBE AMP (~~LOC~~) NOT AT ARMC
Chlamydia: POSITIVE — AB
Comment: NEGATIVE
Comment: NORMAL
Neisseria Gonorrhea: NEGATIVE

## 2022-05-26 LAB — HIV ANTIBODY (ROUTINE TESTING W REFLEX): HIV Screen 4th Generation wRfx: NONREACTIVE

## 2022-05-26 NOTE — MAU Provider Note (Signed)
Chief Complaint: Vaginal Bleeding   None       SUBJECTIVE HPI: Crystal Jenkins is a 36 y.o. [phone] at Unknown by LMP who presents to maternity admissions reporting cramping and light pink bleeding.  . She denies vaginal itching/burning, urinary symptoms, h/a, dizziness, n/v, or fever/chills.     Vaginal Bleeding The patient's primary symptoms include pelvic pain and vaginal bleeding. The patient's pertinent negatives include no genital itching or genital odor. The current episode started today. The problem has been unchanged. Pertinent negatives include no back pain, constipation, fever or headaches. The vaginal discharge was bloody. The vaginal bleeding is spotting. She has not been passing clots. She has not been passing tissue. Nothing aggravates the symptoms. She has tried nothing for the symptoms.   RN Note: REENE Jenkins is a 36 y.o. at Unknown here in MAU reporting: VB and lower ABD cramping since yesterday that started with light pink bleeding once, and today red blood with wiping. Pt denies LOF and abnormal discharge. Pt report last intercourse was night before VB started.  LMP: 03/29/2022 Onset of complaint: yesterday  Pain score: 4/10  Past Medical History:  Diagnosis Date   Adenocarcinoma of Bartholin's gland, stage 1 ([place])    Cancer ([place])    cervical   Extrauterine pregnancy    Headache(784.0)    Infection    UTI   MRSA (methicillin resistant Staphylococcus aureus)    Sleeping difficulties    wakes up w/ trouble going back to sleep   Vaginal Pap smear, abnormal    2005; had cryo   Vertigo    Vertigo    Past Surgical History:  Procedure Laterality Date   CESAREAN SECTION  2007   CESAREAN SECTION N/A 07/30/2014   Procedure: CESAREAN SECTION;  Surgeon: Truett Mainland, DO;  Location: [facility];  Service: Obstetrics;  Laterality: N/A;   CESAREAN SECTION  2009   CRYOTHERAPY     DILATION AND CURETTAGE OF UTERUS     LAPAROTOMY     removal of ectopic preg   Social  History   Socioeconomic History   Marital status: Single    Spouse name: Not on file   Number of children: 3   Years of education: Associates   Highest education level: Not on file  Occupational History    Employer: [employer]  Tobacco Use   Smoking status: Former    Types: Cigarettes    Quit date: 11/11/2013    Years since quitting: 8.5   Smokeless tobacco: Never   Tobacco comments:    social somoker, quit with +preg  Vaping Use   Vaping Use: Never used  Substance and Sexual Activity   Alcohol use: No    Comment: very rare   Drug use: No   Sexual activity: Never    Birth control/protection: Injection    Comment: wants depo provera  Other Topics Concern   Not on file  Social History Narrative   Patient is single, has 3 children   Patient is right handed   Education level is Associate's degree   Drinks 1-2 cups of caffeine daily   Social Determinants of Health   Financial Resource Strain: Not on file  Food Insecurity: Not on file  Transportation Needs: Not on file  Physical Activity: Not on file  Stress: Not on file  Social Connections: Not on file  Intimate Partner Violence: Not on file   No current facility-administered medications on file prior to encounter.   Current Outpatient  Medications on File Prior to Encounter  Medication Sig Dispense Refill   DimenhyDRINATE (DRAMAMINE PO) Take 1 tablet as needed by mouth (dizziness).     Fremanezumab-vfrm (AJOVY) 225 MG/1.5ML SOSY Inject 1 pen every 30 (thirty) days into the skin. 3 Syringe 0   HYDROcodone-acetaminophen (NORCO) 5-325 MG tablet Take 2 tablets by mouth every 4 (four) hours as needed. 6 tablet 0   meloxicam (MOBIC) 7.5 MG tablet Take 1 tablet (7.5 mg total) by mouth daily. 10 tablet 0   methocarbamol (ROBAXIN) 500 MG tablet Take 2 tablets (1,000 mg total) by mouth every 6 (six) hours as needed for muscle spasms. 20 tablet 0   nitrofurantoin, macrocrystal-monohydrate, (MACROBID) 100 MG capsule Take  1 capsule (100 mg total) by mouth 2 (two) times daily. (Patient not taking: Reported on 09/19/2018) 10 capsule 0   NONFORMULARY OR COMPOUNDED ITEM Inject 1 application every 30 (thirty) days into the skin. 1 each 11   norethindrone (ORTHO MICRONOR) 0.35 MG tablet Take 1 tablet (0.35 mg total) by mouth daily. 1 Package 11   nortriptyline (PAMELOR) 25 MG capsule Take 1 capsule (25 mg total) at bedtime by mouth. 30 capsule 11   ondansetron (ZOFRAN) 4 MG tablet Take 1 tablet (4 mg total) every 8 (eight) hours as needed by mouth for nausea or vomiting. 20 tablet 12   ondansetron (ZOFRAN-ODT) 4 MG disintegrating tablet Take 1 tablet (4 mg total) by mouth every 8 (eight) hours as needed for nausea or vomiting. 20 tablet 0   rizatriptan (MAXALT) 10 MG tablet Take 1 tablet (10 mg total) as needed by mouth for migraine. May repeat in 2 hours if needed 10 tablet 11   Allergies  Allergen Reactions   Oxycodone Itching   Sulfamethoxazole-Trimethoprim Other (See Comments) and Rash    Constipation  Constipation    I have reviewed patient's Past Medical Hx, Surgical Hx, Family Hx, Social Hx, medications and allergies.   ROS:  Review of Systems  Constitutional:  Negative for fever.  Gastrointestinal:  Negative for constipation.  Genitourinary:  Positive for pelvic pain and vaginal bleeding.  Musculoskeletal:  Negative for back pain.  Neurological:  Negative for headaches.   Review of Systems  Other systems negative   Physical Exam  Physical Exam Patient Vitals for the past 24 hrs:  BP Temp Temp src Pulse Resp SpO2 Height Weight  05/25/22 2118 134/77 98.4 F (36.9 C) Oral 79 16 99 % '5\' 2"'$  (1.575 m) 67.4 kg   Constitutional: Well-developed, well-nourished female in no acute distress.  Cardiovascular: normal rate Respiratory: normal effort GI: Abd soft, non-tender.  MS: Extremities nontender, no edema, normal ROM Neurologic: Alert and oriented x 4.  GU: Neg CVAT.  PELVIC EXAM: deferred in lieu  of transvaginal US  LAB RESULTS Results for orders placed or performed during the hospital encounter of 05/25/22 (from the past 48 hour(s))  GC/Chlamydia probe amp ([place])not at [facility]     Status: Abnormal   Collection Time: 05/25/22  8:37 PM  Result Value Ref Range   Neisseria Gonorrhea Negative    Chlamydia Positive (A)    Comment Normal Reference [place] Chlamydia - Negative    Comment      Normal Reference Range Neisseria Gonorrhea - Negative  CBC     Status: Abnormal   Collection Time: 05/25/22  8:56 PM  Result Value Ref Range   WBC 8.4 4.0 - 10.5 K/uL   RBC 4.00 3.87 - 5.11 MIL/uL   Hemoglobin 11.8 (L)  12.0 - 15.0 g/dL   HCT 35.9 (L) 36.0 - 46.0 %   MCV 89.8 80.0 - 100.0 fL   MCH 29.5 26.0 - 34.0 pg   MCHC 32.9 30.0 - 36.0 g/dL   RDW 15.6 (H) 11.5 - 15.5 %   Platelets 416 (H) 150 - 400 K/uL   nRBC 0.0 0.0 - 0.2 %    Comment: Performed at [facility] [address] [place], [place] [address]  hCG, quantitative, pregnancy     Status: Abnormal   Collection Time: 05/25/22  8:56 PM  Result Value Ref Range   hCG, Beta Chain, Quant, S 17,425 (H) <5 mIU/mL    Comment:          GEST. AGE      CONC.  (mIU/mL)   <=1 WEEK        5 - 50     2 WEEKS       50 - 500     3 WEEKS       100 - 10,000     4 WEEKS     1,000 - 30,000     5 WEEKS     3,500 - 115,000   6-8 WEEKS     12,000 - 270,000    12 WEEKS     15,000 - 220,000        FEMALE AND NON-PREGNANT FEMALE:     LESS THAN 5 mIU/mL Performed at [facility], [place] [address], [place], [place] [address]   HIV Antibody (routine testing w rflx)     Status: None   Collection Time: 05/25/22  8:56 PM  Result Value Ref Range   HIV Screen 4th Generation wRfx Non Reactive Non Reactive    Comment: Performed at [facility], [place] [address] [place], [place] [address]  Wet prep, genital     Status: Abnormal   Collection Time: 05/25/22  9:54 PM  Result Value Ref Range   Yeast Wet Prep HPF POC NONE SEEN NONE SEEN    Trich, Wet Prep NONE SEEN NONE SEEN   Clue Cells Wet Prep HPF POC PRESENT (A) NONE SEEN   WBC, Wet Prep HPF POC <10 <10   Sperm NONE SEEN     Comment: Performed at [facility], [place] [address] [place], [place] [address]     IMAGING US OB LESS THAN 14 WEEKS WITH OB TRANSVAGINAL  Result Date: 05/25/2022 CLINICAL DATA:  Vaginal bleeding EXAM: OBSTETRIC <14 WK [place] AND TRANSVAGINAL OB US TECHNIQUE: Both transabdominal and transvaginal ultrasound examinations were performed for complete evaluation of the gestation as well as the maternal uterus, adnexal regions, and pelvic cul-de-sac. Transvaginal technique was performed to assess early pregnancy. COMPARISON:  None Available. FINDINGS: Intrauterine gestational sac: Single intrauterine gestational sac, slight irregular shape. Yolk sac:  Visualized Embryo:  Not visualized Cardiac Activity: Not visualized MSD: 11.2 mm   5 w   6 d Subchorionic hemorrhage:  None visualized. Maternal uterus/adnexae: Ovaries are within normal limits. Left ovary measures 2.7 x 2.2 by 1.6 cm. The right ovary measures 3.7 x 1.5 x 2.2 cm. No significant free fluid IMPRESSION: 1. Single intrauterine gestational sac and yolk sac but no embryo. Suggest ultrasound follow-up in 10-14 days to confirm viability. Electronically Signed   By: Donavan Foil M.D.   On: 05/25/2022 22:38    MAU Management/MDM: I have reviewed the triage vital signs and the nursing notes.   Pertinent labs &  imaging results that were available during my care of the patient were reviewed by me and considered in my medical decision making (see chart for details).      I have reviewed her medical records including past results, notes and treatments.   Ordered usual first trimester r/o ectopic labs.   Pelvic cultures done Will check baseline Ultrasound to rule out ectopic.  This bleeding/pain can represent a normal pregnancy with bleeding, spontaneous abortion or even an ectopic which can be  life-threatening.  The process as listed above helps to determine which of these is present.  Reviewed [place] is inconclusive for viable IUP, since we don't see a yolk sac or embryo yet. Will repeat US in a week for viability  ASSESSMENT 1. [redacted] weeks gestation of pregnancy   2. Bleeding in early pregnancy   3. Pregnancy of unknown anatomic location     PLAN Discharge home Plan to repeat Ultrasound in about 7-10 days Ectopic precautions   [place] [facility] [place] [facility] Follow up.   Specialty: Obstetrics and Gynecology Why: Someone from Ultrasound will call with appointment Contact information: [place] [address] [phone]               Pt stable at time of discharge. Encouraged to return here if she develops worsening of symptoms, increase in pain, fever, or other concerning symptoms.    Hansel Feinstein CNM, MSN Certified Nurse-Midwife 05/26/2022  9:16 PM

## 2022-05-27 ENCOUNTER — Other Ambulatory Visit: Payer: Self-pay | Admitting: Advanced Practice Midwife

## 2022-05-27 MED ORDER — AZITHROMYCIN 500 MG PO TABS: 1000.0000 mg | ORAL_TABLET | Freq: Once | ORAL | 1 refills | Status: AC

## 2022-05-27 NOTE — Progress Notes (Signed)
+   Chlamydia Rx sent to pharmacy for Azithromycin 1gm

## 2022-05-30 ENCOUNTER — Telehealth (HOSPITAL_COMMUNITY): Payer: Self-pay

## 2022-05-30 MED ORDER — AZITHROMYCIN 500 MG PO TABS: 1000.0000 mg | ORAL_TABLET | Freq: Once | ORAL | 0 refills | Status: AC

## 2022-05-31 ENCOUNTER — Telehealth: Payer: Self-pay

## 2022-05-31 DIAGNOSIS — O3680X1 Pregnancy with inconclusive fetal viability, fetus 1: Secondary | ICD-10-CM

## 2022-05-31 DIAGNOSIS — Z3490 Encounter for supervision of normal pregnancy, unspecified, unspecified trimester: Secondary | ICD-10-CM

## 2022-05-31 NOTE — Telephone Encounter (Signed)
Called pt to schedule follow up ultrasound following visit to MAU. Pt reports increased vaginal bleeding since MAU on 05/25/22. Describes wearing a pad and seeing bright red, light bleeding. Reviewed with Manya Silvas, CNM who recommends [place] next available and pt to return to MAU for any heavy bleeding or severe pain. [place] scheduled for tomorrow at 1530. Recommendation reviewed with patient.

## 2022-06-01 ENCOUNTER — Encounter: Payer: Self-pay | Admitting: Advanced Practice Midwife

## 2022-06-01 ENCOUNTER — Ambulatory Visit
Admission: RE | Admit: 2022-06-01 | Discharge: 2022-06-01 | Disposition: A | Discharge status: Home / Self Care | Payer: BC Managed Care – PPO | Source: Ambulatory Visit | Attending: Advanced Practice Midwife | Admitting: Advanced Practice Midwife

## 2022-06-01 ENCOUNTER — Ambulatory Visit (INDEPENDENT_AMBULATORY_CARE_PROVIDER_SITE_OTHER): Payer: BC Managed Care – PPO | Admitting: *Deleted

## 2022-06-01 DIAGNOSIS — A749 Chlamydial infection, unspecified: Secondary | ICD-10-CM | POA: Insufficient documentation

## 2022-06-01 DIAGNOSIS — Z3687 Encounter for antenatal screening for uncertain dates: Secondary | ICD-10-CM | POA: Diagnosis not present

## 2022-06-01 DIAGNOSIS — O209 Hemorrhage in early pregnancy, unspecified: Secondary | ICD-10-CM | POA: Insufficient documentation

## 2022-06-01 DIAGNOSIS — Z3A09 9 weeks gestation of pregnancy: Secondary | ICD-10-CM | POA: Insufficient documentation

## 2022-06-01 DIAGNOSIS — Z3490 Encounter for supervision of normal pregnancy, unspecified, unspecified trimester: Secondary | ICD-10-CM | POA: Diagnosis present

## 2022-06-01 DIAGNOSIS — O3680X0 Pregnancy with inconclusive fetal viability, not applicable or unspecified: Secondary | ICD-10-CM

## 2022-06-01 NOTE — Progress Notes (Signed)
Here for [place] results. Reviewed results with Dr. Harolyn Rutherford. Informed patient we cannot yet verify if she has viable pregnancy or not. I answered her questions and support given. Follow up US scheduled for 06/08/22 at 3pm . Ectopic precautions given. She voices understanding. Staci Acosta

## 2022-06-05 ENCOUNTER — Encounter (HOSPITAL_COMMUNITY): Payer: Self-pay | Admitting: Family Medicine

## 2022-06-05 ENCOUNTER — Inpatient Hospital Stay (HOSPITAL_COMMUNITY)
Admission: AD | Admit: 2022-06-05 | Discharge: 2022-06-05 | Disposition: A | Discharge status: Home / Self Care | Payer: BC Managed Care – PPO | Attending: Family Medicine | Admitting: Family Medicine

## 2022-06-05 ENCOUNTER — Inpatient Hospital Stay (HOSPITAL_COMMUNITY): Payer: BC Managed Care – PPO

## 2022-06-05 DIAGNOSIS — Z3A01 Less than 8 weeks gestation of pregnancy: Secondary | ICD-10-CM

## 2022-06-05 DIAGNOSIS — O021 Missed abortion: Secondary | ICD-10-CM

## 2022-06-05 DIAGNOSIS — O209 Hemorrhage in early pregnancy, unspecified: Secondary | ICD-10-CM | POA: Diagnosis present

## 2022-06-05 LAB — URINALYSIS, MICROSCOPIC (REFLEX)

## 2022-06-05 LAB — CBC
HCT: 33.8 % — ABNORMAL LOW (ref 36.0–46.0)
Hemoglobin: 11.3 g/dL — ABNORMAL LOW (ref 12.0–15.0)
MCH: 29.6 pg (ref 26.0–34.0)
MCHC: 33.4 g/dL (ref 30.0–36.0)
MCV: 88.5 fL (ref 80.0–100.0)
Platelets: 413 10*3/uL — ABNORMAL HIGH (ref 150–400)
RBC: 3.82 MIL/uL — ABNORMAL LOW (ref 3.87–5.11)
RDW: 15.4 % (ref 11.5–15.5)
WBC: 11.1 10*3/uL — ABNORMAL HIGH (ref 4.0–10.5)
nRBC: 0 % (ref 0.0–0.2)

## 2022-06-05 LAB — URINALYSIS, ROUTINE W REFLEX MICROSCOPIC
Bilirubin Urine: NEGATIVE
Glucose, UA: NEGATIVE mg/dL
Ketones, ur: NEGATIVE mg/dL
Nitrite: NEGATIVE
Protein, ur: NEGATIVE mg/dL
Specific Gravity, Urine: 1.02 (ref 1.005–1.030)
pH: 7.5 (ref 5.0–8.0)

## 2022-06-05 LAB — HCG, QUANTITATIVE, PREGNANCY: hCG, Beta Chain, Quant, S: 7826 m[IU]/mL — ABNORMAL HIGH (ref <5)

## 2022-06-05 MED ORDER — TRAMADOL HCL 50 MG PO TABS: 50.0000 mg | ORAL_TABLET | ORAL | 0 refills | Status: AC | PRN

## 2022-06-05 NOTE — MAU Note (Signed)
.  Crystal Jenkins is a 36 y.o. at 105w4dhere in MAU reporting: pt reports she stared bleeding on Friday. Has gotten heavier today and passing clots (quarter sized). Cramping is worse also. Dx with failed pregnancy on 06/01/22. Pt stated bleeding has slowed down but pain is still bad LMP: Onset of complaint: Friday Pain score: 10 Vitals:   06/05/22 1824  BP: (!) 125/97  Pulse: (!) 18  Resp: 18  Temp: 98.5 F (36.9 C)     FHT:n/a Lab orders placed from triage:

## 2022-06-05 NOTE — Discharge Instructions (Signed)
Return to care  If you have heavier bleeding that soaks through more than 2 pads per hour for an hour or more If you bleed so much that you feel like you might pass out or you do pass out If you have significant abdominal pain that is not improved with Tylenol   

## 2022-06-05 NOTE — MAU Provider Note (Addendum)
History     CSN: [id number]  Arrival date and time: 06/05/22 5009   Event Date/Time   First Provider Initiated Contact with Patient 06/05/22 1845      Chief Complaint  Patient presents with   Vaginal Bleeding   Abdominal Pain   Crystal Jenkins is a 36 y.o. [id number] at 60w4dwho presents today with vaginal bleeding. She states that she has been bleeding off and on since her visit here on 05/25/2022. However last evening and into today she has had much heavier bleeding and pain. She reports that for several hours she soaked through multiple pads with clots and had intense pain, but now it is a little bit better.   Vaginal Bleeding The patient's primary symptoms include pelvic pain and vaginal bleeding. This is a new problem. The current episode started yesterday. The problem occurs constantly. The problem has been gradually improving. The problem affects both sides. She is pregnant. The vaginal discharge was bloody. The vaginal bleeding is heavier than menses. She has been passing clots (around the size of a quarter). She has not been passing tissue. She has tried acetaminophen and NSAIDs for the symptoms. The treatment provided no relief.    OB History     Gravida  6   Para  3   Term  3   Preterm  0   AB  2   Living  3      SAB  1   IAB      Ectopic  1   Multiple  0   Live Births  3           Past Medical History:  Diagnosis Date   [place]den[place] of [place]olin's [place], [place] 1 [place]    Cancer ([place]    cervical   Extrauterine pregnancy    Headache(784.0)    Infection    UTI   MRSA (methicillin resistant Staphylococcus aureus)    Sleeping difficulties    wakes up w/ trouble going back to sleep   Vaginal Pap smear, abnormal    2005; had cryo   Vertigo    Vertigo     Past Surgical History:  Procedure Laterality Date   CESAREAN SECTION  2007   CESAREAN SECTION N/A 07/30/2014   Procedure: CESAREAN SECTION;  Surgeon: JTruett Mainland DO;  [facility];  Service: Obstetrics;  Laterality: N/A;   CESAREAN SECTION  2009   CRYOTHERAPY     DILATION AND CURETTAGE OF UTERUS     LAPAROTOMY     removal of ectopic preg    Family History  Problem Relation Age of Onset   Hypertension Mother    Stroke Mother    Heart disease Mother    Epilepsy Mother    HIV/AIDS Father    Heart disease Paternal Aunt    Deep vein thrombosis Paternal Aunt    Hypertension Maternal Grandmother    Diabetes Maternal Grandmother    Cancer Maternal Grandmother     Social History   Tobacco Use   Smoking status: Former    Types: Cigarettes    Quit date: 11/11/2013    Years since quitting: 8.5   Smokeless tobacco: Never   Tobacco comments:    social somoker, quit with +preg  Vaping Use   Vaping Use: Never used  Substance Use Topics   Alcohol use: No    Comment: very rare   Drug use: No    Allergies:  Allergies  Allergen Reactions   Oxycodone  Itching   Sulfamethoxazole-Trimethoprim Other (See Comments) and Rash    Constipation  Constipation    Medications Prior to Admission  Medication Sig Dispense Refill Last Dose   DimenhyDRINATE (DRAMAMINE PO) Take 1 tablet as needed by mouth (dizziness).   Unknown   ondansetron (ZOFRAN-ODT) 4 MG disintegrating tablet Take 1 tablet (4 mg total) by mouth every 8 (eight) hours as needed for nausea or vomiting. (Patient not taking: Reported on 06/01/2022) 20 tablet 0 Unknown    Review of Systems  Genitourinary:  Positive for pelvic pain and vaginal bleeding.  All other systems reviewed and are negative.  Physical Exam   Blood pressure (!) 130/94, pulse 87, temperature 98.5 F (36.9 C), resp. rate 18, SpO2 100 %.  Physical Exam Constitutional:      Appearance: She is well-developed.  HENT:     Head: Normocephalic.  Eyes:     Pupils: Pupils are equal, round, and reactive to light.  Cardiovascular:     Rate and Rhythm: Normal rate and regular rhythm.     Heart sounds: Normal heart sounds.   Pulmonary:     Effort: Pulmonary effort is normal. No respiratory distress.     Breath sounds: Normal breath sounds.  Abdominal:     Palpations: Abdomen is soft.     Tenderness: There is no abdominal tenderness.  Genitourinary:    Vagina: No bleeding. Vaginal discharge: mucusy.    Comments: External: no lesion Vagina: small amount of white discharge     Musculoskeletal:        General: Normal range of motion.     Cervical back: Normal range of motion and neck supple.  Skin:    General: Skin is warm and dry.  Neurological:     Mental Status: She is alert and oriented to person, place, and time.  Psychiatric:        Mood and Affect: Mood normal.        Behavior: Behavior normal.     Results for orders placed or performed during the hospital encounter of 06/05/22 (from the past 24 hour(s))  Urinalysis, Routine w reflex microscopic Urine, Clean Catch     Status: Abnormal   Collection Time: 06/05/22  6:45 PM  Result Value Ref Range   Color, Urine YELLOW YELLOW   APPearance CLEAR CLEAR   Specific Gravity, Urine 1.020 1.005 - 1.030   pH 7.5 5.0 - 8.0   Glucose, UA NEGATIVE NEGATIVE mg/dL   Hgb urine dipstick LARGE (A) NEGATIVE   Bilirubin Urine NEGATIVE NEGATIVE   Ketones, ur NEGATIVE NEGATIVE mg/dL   Protein, ur NEGATIVE NEGATIVE mg/dL   Nitrite NEGATIVE NEGATIVE   Leukocytes,Ua TRACE (A) NEGATIVE  Urinalysis, Microscopic (reflex)     Status: Abnormal   Collection Time: 06/05/22  6:45 PM  Result Value Ref Range   RBC / HPF 11-20 0 - 5 RBC/hpf   WBC, UA 0-5 0 - 5 WBC/hpf   Bacteria, UA RARE (A) NONE SEEN   Squamous Epithelial / LPF 0-5 0 - 5   Mucus PRESENT    Amorphous Crystal PRESENT   CBC     Status: Abnormal   Collection Time: 06/05/22  7:06 PM  Result Value Ref Range   WBC 11.1 (H) 4.0 - 10.5 K/uL   RBC 3.82 (L) 3.87 - 5.11 MIL/uL   Hemoglobin 11.3 (L) 12.0 - 15.0 g/dL   HCT 33.8 (L) 36.0 - 46.0 %   MCV 88.5 80.0 - 100.0 fL   MCH 29.6 26.0 -  34.0 pg   MCHC  33.4 30.0 - 36.0 g/dL   RDW 15.4 11.5 - 15.5 %   Platelets 413 (H) 150 - 400 K/uL   nRBC 0.0 0.0 - 0.2 %  hCG, quantitative, pregnancy     Status: Abnormal   Collection Time: 06/05/22  7:06 PM  Result Value Ref Range   hCG, Beta Chain, Quant, S 7,826 (H) <5 mIU/mL    US OB Transvaginal  Result Date: 06/05/2022 CLINICAL DATA:  Vaginal bleeding. LMP: 03/29/2022 corresponding to an estimated gestational age of [redacted] weeks, 5 days. EXAM: TRANSVAGINAL OB ULTRASOUND TECHNIQUE: Transvaginal ultrasound was performed for complete evaluation of the gestation as well as the maternal uterus, adnexal regions, and pelvic cul-de-sac. COMPARISON:  Pelvic ultrasound dated 06/02/2019 and 05/25/2022. FINDINGS: The uterus is anteverted. There is an elongated and somewhat irregular gestational sac in the endometrium. Yolk sac is seen. No fetal pole identified. Moderate amount of blood products/clot noted in the mid to lower endometrium extending into cervix. The mean sac diameter is approximately 9 mm corresponding to an estimated gestational age of [redacted] weeks, 5 days. The right ovary is unremarkable.  The left ovary is not visualized. No significant free fluid pelvis. IMPRESSION: The constellation of findings most consistent with failed early pregnancy. Clinical correlation is recommended. Electronically Signed   By: Anner Crete M.D.   On: 06/05/2022 20:56    MAU Course  Procedures  MDM 2010: Care turned over to Jorje Guild, NP  [place] results pending   Marcille Buffy DNP, CNM  06/05/22  8:10 PM   Failed pregnancy diagnosed based on dropping HCG & ultrasound. Today's ultrasound shows IUGS with yolk sac in lower uterine segment. 11 days ago patient had ultrasound that showed IUGS with yolk sac.   Discussed options for management of incomplete AB including expectant management or cytotec. Prefers expectant management at this time. Verbalizes understanding that intervention may become necessary if SAB in not  completed spontaneously or if heavy bleeding or infection occur.      Assessment and Plan   1. Missed abortion   2. [redacted] weeks gestation of pregnancy    -Rx ultram prn -Reviewed bleeding precautions & reasons to return to MAU -Message to office for f/u appt this week  Jorje Guild, NP

## 2022-06-08 ENCOUNTER — Other Ambulatory Visit: Payer: BC Managed Care – PPO

## 2022-06-08 ENCOUNTER — Ambulatory Visit: Payer: BC Managed Care – PPO | Admitting: Obstetrics and Gynecology
